# Patient Record
Sex: Female | Born: 1960 | Race: White | Hispanic: No | Marital: Single | Smoking: Current every day smoker
Health system: Southern US, Community
[De-identification: ages and names within clinical notes are randomized; demographics above are authoritative.]

## PROBLEM LIST (undated history)

## (undated) DIAGNOSIS — I2510 Atherosclerotic heart disease of native coronary artery without angina pectoris: Secondary | ICD-10-CM

## (undated) DIAGNOSIS — M79646 Pain in unspecified finger(s): Secondary | ICD-10-CM

## (undated) DIAGNOSIS — R0600 Dyspnea, unspecified: Secondary | ICD-10-CM

## (undated) DIAGNOSIS — I10 Essential (primary) hypertension: Secondary | ICD-10-CM

## (undated) DIAGNOSIS — E7800 Pure hypercholesterolemia, unspecified: Secondary | ICD-10-CM

## (undated) DIAGNOSIS — I219 Acute myocardial infarction, unspecified: Secondary | ICD-10-CM

## (undated) DIAGNOSIS — F32A Depression, unspecified: Secondary | ICD-10-CM

## (undated) DIAGNOSIS — F419 Anxiety disorder, unspecified: Secondary | ICD-10-CM

## (undated) DIAGNOSIS — Z8489 Family history of other specified conditions: Secondary | ICD-10-CM

## (undated) DIAGNOSIS — Z8669 Personal history of other diseases of the nervous system and sense organs: Secondary | ICD-10-CM

## (undated) DIAGNOSIS — J189 Pneumonia, unspecified organism: Secondary | ICD-10-CM

## (undated) DIAGNOSIS — J449 Chronic obstructive pulmonary disease, unspecified: Secondary | ICD-10-CM

## (undated) DIAGNOSIS — I739 Peripheral vascular disease, unspecified: Secondary | ICD-10-CM

## (undated) DIAGNOSIS — G8929 Other chronic pain: Secondary | ICD-10-CM

## (undated) DIAGNOSIS — M797 Fibromyalgia: Secondary | ICD-10-CM

## (undated) DIAGNOSIS — J439 Emphysema, unspecified: Secondary | ICD-10-CM

## (undated) DIAGNOSIS — Z8679 Personal history of other diseases of the circulatory system: Secondary | ICD-10-CM

## (undated) DIAGNOSIS — I252 Old myocardial infarction: Secondary | ICD-10-CM

## (undated) DIAGNOSIS — M1990 Unspecified osteoarthritis, unspecified site: Secondary | ICD-10-CM

## (undated) DIAGNOSIS — Z8639 Personal history of other endocrine, nutritional and metabolic disease: Secondary | ICD-10-CM

## (undated) DIAGNOSIS — Z87898 Personal history of other specified conditions: Secondary | ICD-10-CM

## (undated) DIAGNOSIS — G8911 Acute pain due to trauma: Secondary | ICD-10-CM

## (undated) DIAGNOSIS — M25511 Pain in right shoulder: Secondary | ICD-10-CM

## (undated) DIAGNOSIS — R911 Solitary pulmonary nodule: Secondary | ICD-10-CM

## (undated) DIAGNOSIS — Z9889 Other specified postprocedural states: Secondary | ICD-10-CM

## (undated) DIAGNOSIS — K5100 Ulcerative (chronic) pancolitis without complications: Secondary | ICD-10-CM

## (undated) DIAGNOSIS — F329 Major depressive disorder, single episode, unspecified: Secondary | ICD-10-CM

## (undated) DIAGNOSIS — R569 Unspecified convulsions: Secondary | ICD-10-CM

## (undated) DIAGNOSIS — I700 Atherosclerosis of aorta: Secondary | ICD-10-CM

## (undated) DIAGNOSIS — J45909 Unspecified asthma, uncomplicated: Secondary | ICD-10-CM

## (undated) DIAGNOSIS — R42 Dizziness and giddiness: Secondary | ICD-10-CM

## (undated) DIAGNOSIS — G43909 Migraine, unspecified, not intractable, without status migrainosus: Secondary | ICD-10-CM

## (undated) DIAGNOSIS — R5382 Chronic fatigue, unspecified: Secondary | ICD-10-CM

## (undated) DIAGNOSIS — S2249XA Multiple fractures of ribs, unspecified side, initial encounter for closed fracture: Secondary | ICD-10-CM

## (undated) HISTORY — DX: Major depressive disorder, single episode, unspecified: F32.9

## (undated) HISTORY — DX: Acute pain due to trauma: G89.11

## (undated) HISTORY — PX: KNEE ARTHROPLASTY: SHX992

## (undated) HISTORY — PX: BACK SURGERY: SHX140

## (undated) HISTORY — DX: Multiple fractures of ribs, unspecified side, initial encounter for closed fracture: S22.49XA

## (undated) HISTORY — DX: Anxiety disorder, unspecified: F41.9

## (undated) HISTORY — DX: Depression, unspecified: F32.A

## (undated) HISTORY — DX: Pain in right shoulder: M25.511

## (undated) HISTORY — DX: Family history of other specified conditions: Z84.89

## (undated) HISTORY — DX: Personal history of other specified conditions: Z87.898

## (undated) HISTORY — DX: Old myocardial infarction: I25.2

## (undated) HISTORY — DX: Other chronic pain: G89.29

## (undated) HISTORY — DX: Atherosclerotic heart disease of native coronary artery without angina pectoris: I25.10

## (undated) HISTORY — DX: Pain in unspecified finger(s): M79.646

## (undated) HISTORY — DX: Chronic obstructive pulmonary disease, unspecified: J44.9

## (undated) HISTORY — PX: ABDOMINAL HYSTERECTOMY: SHX81

## (undated) HISTORY — DX: Personal history of other endocrine, nutritional and metabolic disease: Z86.39

## (undated) HISTORY — DX: Dizziness and giddiness: R42

## (undated) HISTORY — DX: Chronic fatigue, unspecified: R53.82

## (undated) HISTORY — DX: Essential (primary) hypertension: I10

## (undated) HISTORY — PX: TRIGGER FINGER RELEASE: SHX641

## (undated) HISTORY — PX: FRACTURE SURGERY: SHX138

## (undated) HISTORY — DX: Unspecified osteoarthritis, unspecified site: M19.90

## (undated) HISTORY — DX: Pure hypercholesterolemia, unspecified: E78.00

## (undated) HISTORY — PX: OTHER SURGICAL HISTORY: SHX169

## (undated) HISTORY — DX: Personal history of other diseases of the circulatory system: Z86.79

## (undated) HISTORY — PX: LUMBAR DISC SURGERY: SHX700

## (undated) HISTORY — PX: ROTATOR CUFF REPAIR: SHX139

## (undated) HISTORY — DX: Other specified postprocedural states: Z98.890

## (undated) HISTORY — DX: Unspecified asthma, uncomplicated: J45.909

## (undated) HISTORY — DX: Peripheral vascular disease, unspecified: I73.9

## (undated) HISTORY — DX: Personal history of other diseases of the nervous system and sense organs: Z86.69

---

## 2011-08-11 ENCOUNTER — Emergency Department: Payer: Self-pay | Admitting: Emergency Medicine

## 2011-08-12 ENCOUNTER — Ambulatory Visit: Payer: Self-pay | Admitting: Anesthesiology

## 2011-08-16 ENCOUNTER — Ambulatory Visit: Payer: Self-pay | Admitting: Anesthesiology

## 2011-08-22 ENCOUNTER — Ambulatory Visit: Payer: Self-pay | Admitting: Anesthesiology

## 2011-08-24 ENCOUNTER — Observation Stay: Payer: Self-pay | Admitting: Internal Medicine

## 2011-08-28 ENCOUNTER — Ambulatory Visit: Payer: Self-pay | Admitting: Anesthesiology

## 2011-10-30 ENCOUNTER — Emergency Department: Payer: Self-pay | Admitting: Emergency Medicine

## 2011-12-03 ENCOUNTER — Emergency Department: Payer: Self-pay | Admitting: Emergency Medicine

## 2012-02-11 ENCOUNTER — Emergency Department: Payer: Self-pay | Admitting: Emergency Medicine

## 2012-02-14 ENCOUNTER — Emergency Department: Payer: Self-pay | Admitting: Emergency Medicine

## 2012-03-17 ENCOUNTER — Ambulatory Visit: Payer: Self-pay | Admitting: Family Medicine

## 2012-03-28 ENCOUNTER — Emergency Department: Payer: Self-pay | Admitting: Emergency Medicine

## 2012-04-16 ENCOUNTER — Other Ambulatory Visit: Payer: Self-pay | Admitting: Internal Medicine

## 2012-04-16 LAB — CBC WITH DIFFERENTIAL/PLATELET
Basophil #: 0.1 10*3/uL (ref 0.0–0.1)
Basophil %: 1.2 %
Basophil: 2 %
Comment - H1-Com3: NORMAL
Eosinophil #: 0.2 10*3/uL (ref 0.0–0.7)
Eosinophil %: 1.9 %
Eosinophil: 3 %
HCT: 47.1 % — ABNORMAL HIGH (ref 35.0–47.0)
HGB: 15.8 g/dL (ref 12.0–16.0)
Lymphocyte #: 3.3 10*3/uL (ref 1.0–3.6)
Lymphocyte %: 27 %
Lymphocytes: 25 %
MCH: 33.3 pg (ref 26.0–34.0)
MCHC: 33.5 g/dL (ref 32.0–36.0)
MCV: 99 fL (ref 80–100)
Monocyte #: 0.5 x10 3/mm (ref 0.2–0.9)
Monocyte %: 4.1 %
Monocytes: 5 %
Neutrophil #: 8 10*3/uL — ABNORMAL HIGH (ref 1.4–6.5)
Neutrophil %: 65.8 %
Platelet: 369 10*3/uL (ref 150–440)
RBC: 4.74 10*6/uL (ref 3.80–5.20)
RDW: 15.1 % — ABNORMAL HIGH (ref 11.5–14.5)
Segmented Neutrophils: 63 %
Variant Lymphocyte - H1-Rlymph: 2 %
WBC: 12.2 10*3/uL — ABNORMAL HIGH (ref 3.6–11.0)

## 2012-08-12 ENCOUNTER — Emergency Department: Payer: Self-pay | Admitting: Unknown Physician Specialty

## 2012-08-12 LAB — URINALYSIS, COMPLETE
Bilirubin,UR: NEGATIVE
Glucose,UR: NEGATIVE mg/dL (ref 0–75)
Ketone: NEGATIVE
Leukocyte Esterase: NEGATIVE
Nitrite: NEGATIVE
RBC,UR: 2 /HPF (ref 0–5)
Squamous Epithelial: 52
WBC UR: 2 /HPF (ref 0–5)

## 2012-08-12 LAB — CBC WITH DIFFERENTIAL/PLATELET
Basophil #: 0.1 10*3/uL (ref 0.0–0.1)
Eosinophil %: 0.1 %
HCT: 41.8 % (ref 35.0–47.0)
HGB: 14.4 g/dL (ref 12.0–16.0)
Lymphocyte #: 4 10*3/uL — ABNORMAL HIGH (ref 1.0–3.6)
Lymphocyte %: 36.3 %
MCHC: 34.4 g/dL (ref 32.0–36.0)
Monocyte #: 0.7 x10 3/mm (ref 0.2–0.9)
Neutrophil #: 6.2 10*3/uL (ref 1.4–6.5)
Neutrophil %: 56.6 %
Platelet: 320 10*3/uL (ref 150–440)
RBC: 4.1 10*6/uL (ref 3.80–5.20)
RDW: 15.6 % — ABNORMAL HIGH (ref 11.5–14.5)
WBC: 10.9 10*3/uL (ref 3.6–11.0)

## 2012-08-12 LAB — BASIC METABOLIC PANEL
BUN: 17 mg/dL (ref 7–18)
Calcium, Total: 8.7 mg/dL (ref 8.5–10.1)
EGFR (African American): >60
EGFR (Non-African Amer.): >60
Glucose: 92 mg/dL (ref 65–99)

## 2012-08-13 ENCOUNTER — Emergency Department: Payer: Self-pay | Admitting: Internal Medicine

## 2012-12-02 ENCOUNTER — Emergency Department: Payer: Self-pay | Admitting: Emergency Medicine

## 2012-12-02 LAB — URINALYSIS, COMPLETE
Bilirubin,UR: NEGATIVE
Blood: NEGATIVE
Ketone: NEGATIVE
Nitrite: NEGATIVE
Ph: 5 (ref 4.5–8.0)
RBC,UR: 2 /HPF (ref 0–5)
Squamous Epithelial: 1
WBC UR: <1 /HPF (ref 0–5)

## 2012-12-02 LAB — COMPREHENSIVE METABOLIC PANEL
Alkaline Phosphatase: 92 U/L (ref 50–136)
Anion Gap: 7 (ref 7–16)
Bilirubin,Total: <0.1 mg/dL — ABNORMAL LOW (ref 0.2–1.0)
Calcium, Total: 8.3 mg/dL — ABNORMAL LOW (ref 8.5–10.1)
Chloride: 111 mmol/L — ABNORMAL HIGH (ref 98–107)
Co2: 26 mmol/L (ref 21–32)
Creatinine: 0.91 mg/dL (ref 0.60–1.30)
EGFR (African American): >60
EGFR (Non-African Amer.): >60
Glucose: 91 mg/dL (ref 65–99)
Osmolality: 287 (ref 275–301)
Potassium: 4.2 mmol/L (ref 3.5–5.1)
SGOT(AST): 19 U/L (ref 15–37)
SGPT (ALT): 21 U/L (ref 12–78)

## 2012-12-02 LAB — CBC
HGB: 13.8 g/dL (ref 12.0–16.0)
MCHC: 32.7 g/dL (ref 32.0–36.0)
MCV: 102 fL — ABNORMAL HIGH (ref 80–100)
Platelet: 272 10*3/uL (ref 150–440)
RBC: 4.15 10*6/uL (ref 3.80–5.20)

## 2013-03-27 ENCOUNTER — Emergency Department: Payer: Self-pay | Admitting: Emergency Medicine

## 2013-03-27 LAB — CBC
HCT: 44 % (ref 35.0–47.0)
HGB: 15.1 g/dL (ref 12.0–16.0)
MCH: 35 pg — ABNORMAL HIGH (ref 26.0–34.0)
MCHC: 34.3 g/dL (ref 32.0–36.0)
MCV: 102 fL — ABNORMAL HIGH (ref 80–100)
Platelet: 341 10*3/uL (ref 150–440)
RBC: 4.32 10*6/uL (ref 3.80–5.20)
RDW: 14.3 % (ref 11.5–14.5)

## 2013-03-27 LAB — COMPREHENSIVE METABOLIC PANEL
Alkaline Phosphatase: 123 U/L (ref 50–136)
Anion Gap: 6 — ABNORMAL LOW (ref 7–16)
Bilirubin,Total: 0.1 mg/dL — ABNORMAL LOW (ref 0.2–1.0)
Calcium, Total: 9 mg/dL (ref 8.5–10.1)
Chloride: 107 mmol/L (ref 98–107)
Co2: 26 mmol/L (ref 21–32)
Creatinine: 0.75 mg/dL (ref 0.60–1.30)
EGFR (African American): >60
SGOT(AST): 45 U/L — ABNORMAL HIGH (ref 15–37)
Total Protein: 7.6 g/dL (ref 6.4–8.2)

## 2013-03-27 LAB — URINALYSIS, COMPLETE
Bilirubin,UR: NEGATIVE
Ketone: NEGATIVE
Leukocyte Esterase: NEGATIVE
Nitrite: NEGATIVE
Protein: NEGATIVE
Squamous Epithelial: <1
WBC UR: NONE SEEN /HPF (ref 0–5)

## 2013-03-27 LAB — DRUG SCREEN, URINE
Amphetamines, Ur Screen: NEGATIVE (ref ?–1000)
Cannabinoid 50 Ng, Ur ~~LOC~~: NEGATIVE (ref ?–50)
MDMA (Ecstasy)Ur Screen: NEGATIVE (ref ?–500)
Methadone, Ur Screen: NEGATIVE (ref ?–300)
Opiate, Ur Screen: POSITIVE (ref ?–300)
Phencyclidine (PCP) Ur S: NEGATIVE (ref ?–25)

## 2013-03-28 ENCOUNTER — Emergency Department: Payer: Self-pay | Admitting: Emergency Medicine

## 2013-06-10 ENCOUNTER — Ambulatory Visit: Payer: Self-pay | Admitting: Family Medicine

## 2013-09-28 ENCOUNTER — Ambulatory Visit: Payer: Self-pay | Admitting: Family Medicine

## 2013-10-26 ENCOUNTER — Emergency Department: Payer: Self-pay | Admitting: Emergency Medicine

## 2013-10-26 LAB — COMPREHENSIVE METABOLIC PANEL
Anion Gap: 6 — ABNORMAL LOW (ref 7–16)
BUN: 24 mg/dL — ABNORMAL HIGH (ref 7–18)
Chloride: 106 mmol/L (ref 98–107)
Co2: 27 mmol/L (ref 21–32)
EGFR (African American): >60
EGFR (Non-African Amer.): >60
Glucose: 114 mg/dL — ABNORMAL HIGH (ref 65–99)
Potassium: 4.2 mmol/L (ref 3.5–5.1)
SGOT(AST): 18 U/L (ref 15–37)
SGPT (ALT): 23 U/L (ref 12–78)
Total Protein: 6.9 g/dL (ref 6.4–8.2)

## 2013-10-26 LAB — URINALYSIS, COMPLETE
Bilirubin,UR: NEGATIVE
Blood: NEGATIVE
Glucose,UR: NEGATIVE mg/dL (ref 0–75)
Ketone: NEGATIVE
RBC,UR: 6 /HPF (ref 0–5)
Specific Gravity: 1.018 (ref 1.003–1.030)
Squamous Epithelial: 17
WBC UR: 4 /HPF (ref 0–5)

## 2013-10-26 LAB — LIPASE, BLOOD: Lipase: 188 U/L (ref 73–393)

## 2013-10-26 LAB — CBC WITH DIFFERENTIAL/PLATELET
Eosinophil #: 0 10*3/uL (ref 0.0–0.7)
Eosinophil %: 0.1 %
HGB: 14.4 g/dL (ref 12.0–16.0)
MCHC: 34 g/dL (ref 32.0–36.0)
MCV: 102 fL — ABNORMAL HIGH (ref 80–100)
Monocyte #: 0.7 x10 3/mm (ref 0.2–0.9)
Monocyte %: 5.5 %
Neutrophil #: 6.9 10*3/uL — ABNORMAL HIGH (ref 1.4–6.5)
RBC: 4.16 10*6/uL (ref 3.80–5.20)
WBC: 12.5 10*3/uL — ABNORMAL HIGH (ref 3.6–11.0)

## 2013-10-27 LAB — CLOSTRIDIUM DIFFICILE(ARMC)

## 2014-04-20 ENCOUNTER — Ambulatory Visit: Payer: Self-pay | Admitting: Ophthalmology

## 2014-04-20 DIAGNOSIS — I10 Essential (primary) hypertension: Secondary | ICD-10-CM

## 2014-04-20 DIAGNOSIS — Z01810 Encounter for preprocedural cardiovascular examination: Secondary | ICD-10-CM

## 2014-05-03 ENCOUNTER — Ambulatory Visit: Payer: Self-pay | Admitting: Ophthalmology

## 2014-05-24 ENCOUNTER — Emergency Department: Payer: Self-pay | Admitting: Emergency Medicine

## 2014-05-25 ENCOUNTER — Ambulatory Visit: Payer: Self-pay | Admitting: Pain Medicine

## 2014-05-26 ENCOUNTER — Other Ambulatory Visit: Payer: Self-pay | Admitting: Pain Medicine

## 2014-05-26 LAB — HEPATIC FUNCTION PANEL A (ARMC)
ALT: 20 U/L (ref 12–78)
Albumin: 3.8 g/dL (ref 3.4–5.0)
Alkaline Phosphatase: 64 U/L
BILIRUBIN TOTAL: 0.2 mg/dL (ref 0.2–1.0)
Bilirubin, Direct: <0.1 mg/dL (ref 0.00–0.20)
SGOT(AST): 21 U/L (ref 15–37)
TOTAL PROTEIN: 6.9 g/dL (ref 6.4–8.2)

## 2014-05-26 LAB — BASIC METABOLIC PANEL
Anion Gap: 5 — ABNORMAL LOW (ref 7–16)
BUN: 14 mg/dL (ref 7–18)
CO2: 27 mmol/L (ref 21–32)
Calcium, Total: 9 mg/dL (ref 8.5–10.1)
Chloride: 106 mmol/L (ref 98–107)
Creatinine: 0.82 mg/dL (ref 0.60–1.30)
EGFR (African American): >60
EGFR (Non-African Amer.): >60
Glucose: 112 mg/dL — ABNORMAL HIGH (ref 65–99)
OSMOLALITY: 277 (ref 275–301)
POTASSIUM: 4.3 mmol/L (ref 3.5–5.1)
Sodium: 138 mmol/L (ref 136–145)

## 2014-05-26 LAB — MAGNESIUM: MAGNESIUM: 1.8 mg/dL

## 2014-05-26 LAB — SEDIMENTATION RATE: ERYTHROCYTE SED RATE: 4 mm/h (ref 0–30)

## 2014-05-31 ENCOUNTER — Ambulatory Visit: Payer: Self-pay | Admitting: Pain Medicine

## 2014-06-01 ENCOUNTER — Ambulatory Visit: Payer: Self-pay | Admitting: Ophthalmology

## 2014-06-20 ENCOUNTER — Ambulatory Visit: Payer: Self-pay | Admitting: Pain Medicine

## 2014-06-21 ENCOUNTER — Ambulatory Visit: Payer: Self-pay | Admitting: Pain Medicine

## 2014-07-07 ENCOUNTER — Ambulatory Visit: Payer: Self-pay | Admitting: Pain Medicine

## 2014-07-20 ENCOUNTER — Emergency Department: Payer: Self-pay | Admitting: Internal Medicine

## 2014-07-29 DIAGNOSIS — IMO0002 Reserved for concepts with insufficient information to code with codable children: Secondary | ICD-10-CM | POA: Insufficient documentation

## 2014-07-29 DIAGNOSIS — N8110 Cystocele, unspecified: Secondary | ICD-10-CM | POA: Insufficient documentation

## 2014-08-01 ENCOUNTER — Ambulatory Visit: Payer: Self-pay | Admitting: Pain Medicine

## 2014-08-02 ENCOUNTER — Ambulatory Visit: Payer: Self-pay | Admitting: Pain Medicine

## 2014-08-22 ENCOUNTER — Ambulatory Visit: Payer: Self-pay | Admitting: Pain Medicine

## 2014-09-06 ENCOUNTER — Ambulatory Visit: Payer: Self-pay | Admitting: Pain Medicine

## 2014-09-14 ENCOUNTER — Ambulatory Visit: Payer: Self-pay | Admitting: Pain Medicine

## 2014-09-15 ENCOUNTER — Ambulatory Visit: Payer: Self-pay | Admitting: Pain Medicine

## 2014-10-06 ENCOUNTER — Emergency Department: Payer: Self-pay | Admitting: Internal Medicine

## 2014-11-15 DIAGNOSIS — M75100 Unspecified rotator cuff tear or rupture of unspecified shoulder, not specified as traumatic: Secondary | ICD-10-CM | POA: Insufficient documentation

## 2014-11-18 ENCOUNTER — Ambulatory Visit: Payer: Self-pay | Admitting: Pain Medicine

## 2014-12-14 DIAGNOSIS — J449 Chronic obstructive pulmonary disease, unspecified: Secondary | ICD-10-CM | POA: Insufficient documentation

## 2014-12-14 DIAGNOSIS — I252 Old myocardial infarction: Secondary | ICD-10-CM | POA: Insufficient documentation

## 2014-12-14 HISTORY — DX: Old myocardial infarction: I25.2

## 2015-02-18 ENCOUNTER — Emergency Department: Payer: Self-pay | Admitting: Physician Assistant

## 2015-03-25 NOTE — Op Note (Signed)
PATIENT NAME:  Michelle Newton, Michelle M MR#:  [id number] DATE OF BIRTH:  [birth year]  DATE OF PROCEDURE:  05/03/2014  PREOPERATIVE DIAGNOSIS: Visually significant cataract of the right eye.   POSTOPERATIVE DIAGNOSIS: Visually significant cataract of the right eye.   OPERATIVE PROCEDURE: Cataract extraction by phacoemulsification with implant of intraocular lens to the right eye.   SURGEON: Galen ManilaWilliam Trampas Stettner, MD  ANESTHESIA:  1. Managed anesthesia care.  2. 50-50 mixture of 0.75% bupivacaine and 4% Xylocaine given as a retrobulbar block.   COMPLICATIONS: None.   TECHNIQUE:  Stop and chop.  DESCRIPTION OF PROCEDURE: The patient was examined and consented for this procedure in the preoperative holding area and then brought back to the Operating Room where the anesthesia team employed managed anesthesia care.  3.5 milliliters of the aforementioned mixture were placed in the right orbit on an Atkinson needle without complication. The right eye was then prepped and draped in the usual sterile ophthalmic fashion. A lid speculum was placed. The side-port blade was used to create a paracentesis and the anterior chamber was filled with viscoelastic. The keratome was used to create a near clear corneal incision. The continuous curvilinear capsulorrhexis was performed with a cystotome followed by the capsulorrhexis forceps. Hydrodissection and hydrodelineation were carried out with BSS on a blunt cannula. The lens was removed in a stop and chop technique. The remaining cortical material was removed with the irrigation-aspiration handpiece. The capsular bag was inflated with viscoelastic and the Tecnis ZCB00 21.5-diopter lens, serial number [id number] was placed in the capsular bag without complication. The remaining viscoelastic was removed from the eye with the irrigation-aspiration handpiece. The wounds were hydrated. The anterior chamber was flushed with Miostat and the eye was inflated to a physiologic pressure. 0.1  mL of cefuroxime concentration 10 mg/mL was placed in the anterior chamber. The wounds were found to be water tight. The eye was dressed with Vigamox followed by Maxitrol ointment and a protective shield was placed. The patient will followup with me in one day.    ____________________________ Jerilee FieldWilliam L. Lem Peary, MD wlp:dmm D: 05/03/2014 21:25:05 ET T: 05/03/2014 21:37:20 ET JOB#: [id number]  cc: Aryiana Klinkner L. Lashanda Storlie, MD, <Dictator> Jerilee FieldWILLIAM L Esty Ahuja MD ELECTRONICALLY SIGNED 05/04/2014 13:44

## 2015-04-17 ENCOUNTER — Emergency Department
Admission: EM | Admit: 2015-04-17 | Discharge: 2015-04-17 | Disposition: A | Discharge status: Other Acute Inpatient Hospital | Payer: Medicare Other
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
## 2015-04-18 ENCOUNTER — Encounter: Payer: Self-pay | Admitting: Emergency Medicine

## 2015-04-18 ENCOUNTER — Emergency Department: Payer: Medicare Other

## 2015-04-18 ENCOUNTER — Emergency Department
Admission: EM | Admit: 2015-04-18 | Discharge: 2015-04-18 | Disposition: A | Discharge status: Home / Self Care | Payer: Medicare Other | Attending: Internal Medicine | Admitting: Internal Medicine

## 2015-04-18 DIAGNOSIS — Z720 Tobacco use: Secondary | ICD-10-CM | POA: Diagnosis not present

## 2015-04-18 DIAGNOSIS — Y9389 Activity, other specified: Secondary | ICD-10-CM | POA: Diagnosis not present

## 2015-04-18 DIAGNOSIS — Y998 Other external cause status: Secondary | ICD-10-CM | POA: Diagnosis not present

## 2015-04-18 DIAGNOSIS — S59902A Unspecified injury of left elbow, initial encounter: Secondary | ICD-10-CM | POA: Insufficient documentation

## 2015-04-18 DIAGNOSIS — S5002XA Contusion of left elbow, initial encounter: Secondary | ICD-10-CM | POA: Diagnosis not present

## 2015-04-18 DIAGNOSIS — Y92481 Parking lot as the place of occurrence of the external cause: Secondary | ICD-10-CM | POA: Diagnosis not present

## 2015-04-18 DIAGNOSIS — S299XXA Unspecified injury of thorax, initial encounter: Secondary | ICD-10-CM | POA: Diagnosis present

## 2015-04-18 DIAGNOSIS — S3992XA Unspecified injury of lower back, initial encounter: Secondary | ICD-10-CM | POA: Insufficient documentation

## 2015-04-18 DIAGNOSIS — S2232XA Fracture of one rib, left side, initial encounter for closed fracture: Secondary | ICD-10-CM | POA: Diagnosis not present

## 2015-04-18 DIAGNOSIS — T148XXA Other injury of unspecified body region, initial encounter: Secondary | ICD-10-CM

## 2015-04-18 DIAGNOSIS — S61412D Laceration without foreign body of left hand, subsequent encounter: Secondary | ICD-10-CM | POA: Insufficient documentation

## 2015-04-18 HISTORY — DX: Fibromyalgia: M79.7

## 2015-04-18 HISTORY — DX: Migraine, unspecified, not intractable, without status migrainosus: G43.909

## 2015-04-18 HISTORY — DX: Acute myocardial infarction, unspecified: I21.9

## 2015-04-18 HISTORY — DX: Unspecified osteoarthritis, unspecified site: M19.90

## 2015-04-18 HISTORY — DX: Unspecified convulsions: R56.9

## 2015-04-18 HISTORY — DX: Atherosclerotic heart disease of native coronary artery without angina pectoris: I25.10
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
## 2015-04-18 NOTE — Discharge Instructions (Signed)
Take home pain medication as prescribed. Rest. Apply ice. Wear less cleaning as long as pain continues. Performed range of motion exercises to left arm daily. Use incentive spirometer throughout day as directed.  Follow-up with her primary care physician next week. Follow up with orthopedic as needed for continued pain.  Turn to the ER for new or worsening concerns.  Contusion A contusion is a deep bruise. Contusions happen when an injury causes bleeding under the skin. Signs of bruising include pain, puffiness (swelling), and discolored skin. The contusion may turn blue, purple, or yellow. HOME CARE   Put ice on the injured area.  Put ice in a plastic bag.  Place a towel between your skin and the bag.  Leave the ice on for 15-20 minutes, 03-04 times a day.  Only take medicine as told by your doctor.  Rest the injured area.  If possible, raise (elevate) the injured area to lessen puffiness. GET HELP RIGHT AWAY IF:   You have more bruising or puffiness.  You have pain that is getting worse.  Your puffiness or pain is not helped by medicine. MAKE SURE YOU:   Understand these instructions.  Will watch your condition.  Will get help right away if you are not doing well or get worse. Document Released: 05/06/2008 Document Revised: 02/10/2012 Document Reviewed: 09/23/2011 [facility] Patient Information 2015 [place], [place]. This information is not intended to replace advice given to you by your health care provider. Make sure you discuss any questions you have with your health care provider.  Rib Fracture A rib fracture is a break or crack in one of the bones of the ribs. The ribs are like a cage that goes around your upper chest. A broken or cracked rib is often painful, but most do not cause other problems. Most rib fractures heal on their own in 1-3 months. HOME CARE  Avoid activities that cause pain to the injured area. Protect your injured area.  Slowly increase activity as  told by your doctor.  Take medicine as told by your doctor.  Put ice on the injured area for the first 1-2 days after you have been treated or as told by your doctor.  Put ice in a plastic bag.  Place a towel between your skin and the bag.  Leave the ice on for 15-20 minutes at a time, every 2 hours while you are awake.  Do deep breathing as told by your doctor. You may be told to:  Take deep breaths many times a day.  Cough many times a day while hugging a pillow.  Use a device (incentive spirometer) to perform deep breathing many times a day.  Drink enough fluids to keep your pee (urine) clear or pale yellow.   Do not wear a rib belt or binder. These do not allow you to breathe deeply. GET HELP RIGHT AWAY IF:   You have a fever.  You have trouble breathing.   You cannot stop coughing.  You cough up thick or bloody spit (mucus).   You feel sick to your stomach (nauseous), throw up (vomit), or have belly (abdominal) pain.   Your pain gets worse and medicine does not help.  MAKE SURE YOU:   Understand these instructions.  Will watch your condition.  Will get help right away if you are not doing well or get worse. Document Released: 08/27/2008 Document Revised: 03/15/2013 Document Reviewed: 01/20/2013 [facility] CenterExitCare Patient Information 2015 [place], [place]. This information is not intended to replace advice  given to you by your health care provider. Make sure you discuss any questions you have with your health care provider.
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
## 2015-05-17 ENCOUNTER — Emergency Department
Admission: EM | Admit: 2015-05-17 | Discharge: 2015-05-17 | Disposition: A | Discharge status: Home / Self Care | Payer: Medicare Other | Attending: Emergency Medicine | Admitting: Emergency Medicine

## 2015-05-17 ENCOUNTER — Encounter: Payer: Self-pay | Admitting: Emergency Medicine

## 2015-05-17 ENCOUNTER — Emergency Department: Payer: Medicare Other

## 2015-05-17 DIAGNOSIS — W11XXXA Fall on and from ladder, initial encounter: Secondary | ICD-10-CM | POA: Insufficient documentation

## 2015-05-17 DIAGNOSIS — S93401A Sprain of unspecified ligament of right ankle, initial encounter: Secondary | ICD-10-CM | POA: Insufficient documentation

## 2015-05-17 DIAGNOSIS — Y998 Other external cause status: Secondary | ICD-10-CM | POA: Insufficient documentation

## 2015-05-17 DIAGNOSIS — Z720 Tobacco use: Secondary | ICD-10-CM | POA: Diagnosis not present

## 2015-05-17 DIAGNOSIS — Y93E5 Activity, floor mopping and cleaning: Secondary | ICD-10-CM | POA: Insufficient documentation

## 2015-05-17 DIAGNOSIS — Y9289 Other specified places as the place of occurrence of the external cause: Secondary | ICD-10-CM | POA: Diagnosis not present

## 2015-05-17 DIAGNOSIS — S99911A Unspecified injury of right ankle, initial encounter: Secondary | ICD-10-CM | POA: Diagnosis present

## 2015-05-17 NOTE — ED Provider Notes (Signed)
[facility] Emergency Department Provider Note    ____________________________________________  Time seen: 1630  I have reviewed the triage vital signs and the nursing notes.   HISTORY  Chief Complaint Fall   History limited by: Not Limited   HPI Michelle Newton is a 54 y.o. female who presents to the emergency department today after a fall. The patient states she was roughly 6 or 7 feet up a ladder cleaning windows when she fell. States she fell straight onto her back. She denies hitting her head or any loss of consciousness. The patient states she also twisted her right ankle however was not able to tell how she did that. The patient currently is complaining of right ankle and low back pain. Patient denies any neck pain.     Past Medical History  Diagnosis Date  . Fibromyalgia   . Coronary artery disease   . DJD (degenerative joint disease)   . MI (myocardial infarction)   . Seizures   . Migraine     There are no active problems to display for this patient.   Past Surgical History  Procedure Laterality Date  . Ureter mesh    . Lumbar disc surgery    . Rotator cuff repair Right   . Knee arthroplasty Left   . Abdominal hysterectomy    . Trigger finger release Left   . Left elbow surgery      No current outpatient prescriptions on file.  Allergies Cymbalta; Flagyl; and Nsaids  No family history on file.  Social History History  Substance Use Topics  . Smoking status: Current Every Day Smoker -- 0.50 packs/day    Types: Cigarettes  . Smokeless tobacco: Never Used  . Alcohol Use: No    Review of Systems  Constitutional: Negative for fever. Cardiovascular: Negative for chest pain. Respiratory: Negative for shortness of breath. Gastrointestinal: Negative for abdominal pain, vomiting and diarrhea. Genitourinary: Negative for dysuria. Musculoskeletal: Positive for low back pain, right ankle pain Skin: Negative for  rash. Neurological: Negative for headaches, focal weakness or numbness.  10-point ROS otherwise negative.  ____________________________________________   PHYSICAL EXAM:  VITAL SIGNS: ED Triage Vitals  Enc Vitals Group     BP 05/17/15 1328 105/53 mmHg     Pulse Rate 05/17/15 1328 77     Resp 05/17/15 1328 18     Temp 05/17/15 1328 98 F (36.7 C)     Temp Source 05/17/15 1328 Oral     SpO2 05/17/15 1328 96 %     Weight 05/17/15 1328 123 lb (55.792 kg)     Height 05/17/15 1328 4\' 11"  (1.499 m)     Head Cir --      Peak Flow --      Pain Score 05/17/15 1359 8   Constitutional: Alert and oriented. Well appearing and in no distress. Eyes: Conjunctivae are normal. PERRL. Normal extraocular movements. ENT   Head: Normocephalic and atraumatic.   Nose: No congestion/rhinnorhea.   Mouth/Throat: Mucous membranes are moist.   Neck: No stridor. No midline tenderness. Full range of motion which is painless. Hematological/Lymphatic/Immunilogical: No cervical lymphadenopathy. Cardiovascular: Normal rate, regular rhythm.   Respiratory: Normal respiratory effort without tachypnea nor retractions.  Gastrointestinal: Soft and nontender. No distention.  Genitourinary: Deferred Musculoskeletal: Normal range of motion in all extremities. Under to palpation of the lumbar spine. Tender to palpation of the posterior lateral malleolus of the right ankle. Neurologic:  Normal speech and language. No gross focal neurologic deficits are  appreciated. Speech is normal.  Skin:  Skin is warm, dry and intact. No rash noted. Psychiatric: Mood and affect are normal. Speech and behavior are normal. Patient exhibits appropriate insight and judgment.  ____________________________________________    LABS (pertinent positives/negatives)  None  ____________________________________________   EKG  None  ____________________________________________    RADIOLOGY  Lumbar  spine IMPRESSION: No evidence of fracture or subluxation along the lumbar spine. Status post lumbar spinal fusion at L5-S1.  Right ankle  IMPRESSION: Negative. ____________________________________________   PROCEDURES  Procedure(s) performed: None  Critical Care performed: No  ____________________________________________   INITIAL IMPRESSION / ASSESSMENT AND PLAN / ED COURSE  Pertinent labs & imaging results that were available during my care of the patient were reviewed by me and considered in my medical decision making (see chart for details).  Patient presents to the emergency department after a 67 foot fall off of a ladder. Tender to palpation in the lumbar spine and right ankle. Will obtain x-rays.  ----------------------------------------- 5:51 PM on 05/17/2015 -----------------------------------------  Lumbar spine and ankle x-ray negative. Will discharge home.  ____________________________________________   FINAL CLINICAL IMPRESSION(S) / ED DIAGNOSES  Final diagnoses:  Ankle sprain, right, initial encounter     Phineas Semen, MD 05/17/15 1752

## 2015-05-17 NOTE — Discharge Instructions (Signed)
Please seek medical attention for any high fevers, chest pain, shortness of breath, change in behavior, persistent vomiting, bloody stool or any other new or concerning symptoms. ° °Ankle Sprain °An ankle sprain is an injury to the strong, fibrous tissues (ligaments) that hold the bones of your ankle joint together.  °CAUSES °An ankle sprain is usually caused by a fall or by twisting your ankle. Ankle sprains most commonly occur when you step on the outer edge of your foot, and your ankle turns inward. People who participate in sports are more prone to these types of injuries.  °SYMPTOMS  °· Pain in your ankle. The pain may be present at rest or only when you are trying to stand or walk. °· Swelling. °· Bruising. Bruising may develop immediately or within 1 to 2 days after your injury. °· Difficulty standing or walking, particularly when turning corners or changing directions. °DIAGNOSIS  °Your caregiver will ask you details about your injury and perform a physical exam of your ankle to determine if you have an ankle sprain. During the physical exam, your caregiver will press on and apply pressure to specific areas of your foot and ankle. Your caregiver will try to move your ankle in certain ways. An X-ray exam may be done to be sure a bone was not broken or a ligament did not separate from one of the bones in your ankle (avulsion fracture).  °TREATMENT  °Certain types of braces can help stabilize your ankle. Your caregiver can make a recommendation for this. Your caregiver may recommend the use of medicine for pain. If your sprain is severe, your caregiver may refer you to a surgeon who helps to restore function to parts of your skeletal system (orthopedist) or a physical therapist. °HOME CARE INSTRUCTIONS  °· Apply ice to your injury for 1-2 days or as directed by your caregiver. Applying ice helps to reduce inflammation and pain. °¨ Put ice in a plastic bag. °¨ Place a towel between your skin and the  bag. °¨ Leave the ice on for 15-20 minutes at a time, every 2 hours while you are awake. °· Only take over-the-counter or prescription medicines for pain, discomfort, or fever as directed by your caregiver. °· Elevate your injured ankle above the level of your heart as much as possible for 2-3 days. °· If your caregiver recommends crutches, use them as instructed. Gradually put weight on the affected ankle. Continue to use crutches or a cane until you can walk without feeling pain in your ankle. °· If you have a plaster splint, wear the splint as directed by your caregiver. Do not rest it on anything harder than a pillow for the first 24 hours. Do not put weight on it. Do not get it wet. You may take it off to take a shower or bath. °· You may have been given an elastic bandage to wear around your ankle to provide support. If the elastic bandage is too tight (you have numbness or tingling in your foot or your foot becomes cold and blue), adjust the bandage to make it comfortable. °· If you have an air splint, you may blow more air into it or let air out to make it more comfortable. You may take your splint off at night and before taking a shower or bath. Wiggle your toes in the splint several times per day to decrease swelling. °SEEK MEDICAL CARE IF:  °· You have rapidly increasing bruising or swelling. °· Your toes feel extremely   cold or you lose feeling in your foot. °· Your pain is not relieved with medicine. °SEEK IMMEDIATE MEDICAL CARE IF: °· Your toes are numb or blue. °· You have severe pain that is increasing. °MAKE SURE YOU:  °· Understand these instructions. °· Will watch your condition. °· Will get help right away if you are not doing well or get worse. °Document Released: 11/18/2005 Document Revised: 08/12/2012 Document Reviewed: 11/30/2011 °ExitCare® Patient Information ©2015 Exit[employer], LLC. This information is not intended to replace advice given to you by your health care provider. Make sure you  discuss any questions you have with your health care provider. ° °

## 2015-05-17 NOTE — ED Notes (Addendum)
Pt reports falling off a ladder at least 6 feet off of a ladder. . Pt reports falling backwards. Denies hitting head. Reports pain in right ankle. Pt states upper left side back pain. Pt ambulatory after fall increased pain in ankle with ambulation
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
## 2015-05-19 ENCOUNTER — Other Ambulatory Visit: Payer: Self-pay | Admitting: Family Medicine

## 2015-05-19 DIAGNOSIS — N649 Disorder of breast, unspecified: Secondary | ICD-10-CM

## 2015-05-26 ENCOUNTER — Ambulatory Visit: Payer: Medicare Other

## 2015-05-26 ENCOUNTER — Ambulatory Visit
Admission: RE | Admit: 2015-05-26 | Discharge: 2015-05-26 | Disposition: A | Discharge status: Home / Self Care | Payer: Medicare Other | Source: Ambulatory Visit | Attending: Family Medicine | Admitting: Family Medicine

## 2015-05-26 DIAGNOSIS — N649 Disorder of breast, unspecified: Secondary | ICD-10-CM | POA: Insufficient documentation

## 2015-05-26 DIAGNOSIS — N644 Mastodynia: Secondary | ICD-10-CM | POA: Insufficient documentation

## 2015-09-09 ENCOUNTER — Emergency Department: Payer: Medicare Other

## 2015-09-09 ENCOUNTER — Encounter: Payer: Self-pay | Admitting: *Deleted

## 2015-09-09 ENCOUNTER — Emergency Department
Admission: EM | Admit: 2015-09-09 | Discharge: 2015-09-10 | Disposition: A | Discharge status: Home / Self Care | Payer: Medicare Other | Attending: Emergency Medicine | Admitting: Emergency Medicine

## 2015-09-09 DIAGNOSIS — Y9289 Other specified places as the place of occurrence of the external cause: Secondary | ICD-10-CM | POA: Insufficient documentation

## 2015-09-09 DIAGNOSIS — Y998 Other external cause status: Secondary | ICD-10-CM | POA: Diagnosis not present

## 2015-09-09 DIAGNOSIS — S99911A Unspecified injury of right ankle, initial encounter: Secondary | ICD-10-CM | POA: Diagnosis present

## 2015-09-09 DIAGNOSIS — Y9389 Activity, other specified: Secondary | ICD-10-CM | POA: Insufficient documentation

## 2015-09-09 DIAGNOSIS — S82401A Unspecified fracture of shaft of right fibula, initial encounter for closed fracture: Secondary | ICD-10-CM

## 2015-09-09 DIAGNOSIS — S82831A Other fracture of upper and lower end of right fibula, initial encounter for closed fracture: Secondary | ICD-10-CM | POA: Insufficient documentation

## 2015-09-09 DIAGNOSIS — Z720 Tobacco use: Secondary | ICD-10-CM | POA: Diagnosis not present

## 2015-09-09 DIAGNOSIS — W010XXA Fall on same level from slipping, tripping and stumbling without subsequent striking against object, initial encounter: Secondary | ICD-10-CM | POA: Diagnosis not present

## 2015-09-09 MED ORDER — OXYCODONE HCL 5 MG PO TABS: 2.5000 mg | ORAL_TABLET | Freq: Four times a day (QID) | ORAL | Status: DC | PRN

## 2015-09-09 MED ORDER — OXYCODONE HCL 5 MG PO TABS: 0.5000 mg | ORAL_TABLET | Freq: Four times a day (QID) | ORAL | Status: DC | PRN

## 2015-09-09 MED ORDER — OXYCODONE-ACETAMINOPHEN 5-325 MG PO TABS
1.0000 | ORAL_TABLET | Freq: Once | ORAL | Status: AC
  Administered 2015-09-09: 1 via ORAL
  Filled 2015-09-09: qty 1

## 2015-09-09 NOTE — ED Notes (Signed)
Applied posterior leg with stirrup

## 2015-09-09 NOTE — ED Provider Notes (Signed)
CSN: [id number]     Arrival date & time 09/09/15  2135 History   First MD Initiated Contact with Patient 09/09/15 2159     Chief Complaint  Patient presents with  . Ankle Injury     (Consider location/radiation/quality/duration/timing/severity/associated sxs/prior Treatment) HPI  54 year old female presents to the emergency department for evaluation of right ankle and foot pain. Patient states she tripped over her dog, rolled her right ankle and developed right ankle and foot pain. Pain to the lateral aspect of the foot and ankle. Her pain is moderate. No relief with Tylenol 3. Patient denies any knee or hip pain. She is unable to bear weight on the right lower extremity.  Past Medical History  Diagnosis Date  . Fibromyalgia   . Coronary artery disease   . DJD (degenerative joint disease)   . MI (myocardial infarction) (HCC)   . Seizures (HCC)   . Migraine    Past Surgical History  Procedure Laterality Date  . Ureter mesh    . Lumbar disc surgery    . Rotator cuff repair Right   . Knee arthroplasty Left   . Abdominal hysterectomy    . Trigger finger release Left   . Left elbow surgery     Family History  Problem Relation Age of Onset  . Breast cancer Maternal Aunt   . Breast cancer Cousin    Social History  Substance Use Topics  . Smoking status: Current Every Day Smoker -- 0.50 packs/day    Types: Cigarettes  . Smokeless tobacco: Never Used  . Alcohol Use: No   OB History    Gravida Para Term Preterm AB TAB SAB Ectopic Multiple Living   0 0 0 0 0 0 0     Review of Systems  Constitutional: Negative.   Cardiovascular: Negative for chest pain and leg swelling.  Gastrointestinal: Negative for abdominal pain.  Musculoskeletal: Positive for joint swelling and gait problem. Negative for back pain and neck pain.  Skin: Negative for color change, rash and wound.  Neurological: Negative for dizziness, syncope and weakness.  Psychiatric/Behavioral: Negative for  hallucinations and confusion.  All other systems reviewed and are negative.     Allergies  Cymbalta; Flagyl; Nsaids; and Vicodin  Home Medications   Prior to Admission medications   Medication Sig Start Date End Date Taking? Authorizing Provider  oxyCODONE (ROXICODONE) 5 MG immediate release tablet Take 0.5 tablets (2.5 mg total) by mouth every 6 (six) hours as needed for severe pain or breakthrough pain. 09/09/15 09/08/16  Evon Slack, PA-C   BP 111/76 mmHg  Pulse 92  Temp(Src) 97.6 F (36.4 C) (Oral)  Resp 18  Ht  (1.499 m)  Wt 127 lb (57.607 kg)  BMI 25.64 kg/m2  SpO2 95% Physical Exam  Constitutional: She is oriented to person, place, and time. She appears well-developed and well-nourished. No distress.  HENT:  Head: Normocephalic and atraumatic.  Eyes: EOM are normal. Pupils are equal, round, and reactive to light.  Neck: Normal range of motion. Neck supple.  Cardiovascular: Normal rate, regular rhythm and intact distal pulses.   Pulmonary/Chest: Effort normal. No respiratory distress.  Musculoskeletal:       Right ankle: She exhibits decreased range of motion, swelling and ecchymosis. She exhibits no deformity, no laceration and normal pulse. Tenderness. Lateral malleolus and AITFL tenderness found. Achilles tendon exhibits no pain, no defect and normal Thompson's test results.       Left ankle: She exhibits  normal range of motion.       Right foot: There is tenderness and bony tenderness. There is normal range of motion, no swelling, normal capillary refill, no crepitus and no deformity.  Right knee with normal range of motion. No swelling warmth erythema or effusion.  Neurological: She is alert and oriented to person, place, and time.  Skin: Skin is warm and dry.  Psychiatric: She has a normal mood and affect. Her behavior is normal. Judgment and thought content normal.    ED Course  Procedures (including critical care time) SPLINT APPLICATION Date/Time:  10:53 PM Authorized by: Patience Musca Consent: Verbal consent obtained. Risks and benefits: risks, benefits and alternatives were discussed Consent given by: patient Splint applied by: [facility] orthopedic technician Location details: Right ankle  Splint type: Posterior short leg splint with stirrup  Supplies used: 4 inch Ortho-Glass, Ace wrap, cast padding Post-procedure: The splinted body part was neurovascularly unchanged following the procedure. Patient tolerance: Patient tolerated the procedure well with no immediate complications.    Labs Review Labs Reviewed - No data to display  Imaging Review Dg Ankle Complete Right  09/09/2015   CLINICAL DATA:  Status post fall, with pain and swelling about the right ankle. Initial encounter.  EXAM: RIGHT ANKLE - COMPLETE 3+ VIEW  COMPARISON:  Right ankle radiographs performed 05/17/2015  FINDINGS: There is a mildly displaced fracture through the distal fibula.  The ankle mortise is intact; the interosseous space is grossly unremarkable. No talar tilt or subluxation is seen. A small ossicle is noted at the distal Achilles tendon.  The joint spaces are preserved. Mild soft tissue swelling is noted overlying the fracture site.  IMPRESSION: Mildly displaced fracture through the distal fibula.   Electronically Signed   By: Roanna Raider M.D.   On: 09/09/2015 22:30   Dg Foot Complete Right  09/09/2015   CLINICAL DATA:  Status post fall; tripped over dog bed. Right foot pain. Initial encounter.  EXAM: RIGHT FOOT COMPLETE - 3+ VIEW  COMPARISON:  None.  FINDINGS: There is a mildly displaced fracture through the distal fibula. No additional fractures are seen.  The joint spaces are preserved. There is no evidence of talar subluxation; the subtalar joint is unremarkable in appearance. There is a bipartite medial sesamoid of the first toe. An os peroneum is noted.  No significant soft tissue abnormalities are seen.  IMPRESSION: 1. Mildly displaced  fracture through the distal fibula. 2. Bipartite medial sesamoid of the first toe. 3. Os peroneum noted.   Electronically Signed   By: Roanna Raider M.D.   On: 09/09/2015 22:31   I have personally reviewed and evaluated these images and lab results as part of my medical decision-making.   EKG Interpretation None      MDM   Final diagnoses:  Fibula fracture, right, closed, initial encounter    54 year old female with a right distal fibula fracture. There is mild displacement. No evidence of foot fracture. Patient is sent to a posterior leg, stirrup splint. She is nonweightbearing. Dan Humphreys was recommended but patient refused and requested crutches. Patient is given prescription for oxycodone, 5 mg, 2.5 mg by mouth every 6 hours when necessary severe pain. Follow-up with orthopedics first of next week.    Evon Slack, PA-C 09/09/15 2257  Rockne Menghini, MD 09/09/15 [phone]

## 2015-09-09 NOTE — ED Notes (Signed)
Pt presents w/ c/o R ankle and R foot pain after rolling ankle. Pt states she tripped over her dog.

## 2015-09-09 NOTE — ED Notes (Signed)
Pt not tolerating crutches. Michelle Newton will be tried.

## 2015-09-09 NOTE — Discharge Instructions (Signed)
Cast or Splint Care °Casts and splints support injured limbs and keep bones from moving while they heal. It is important to care for your cast or splint at home.   °HOME CARE INSTRUCTIONS °· Keep the cast or splint uncovered during the drying period. It can take 24 to 48 hours to dry if it is made of plaster. A fiberglass cast will dry in less than 1 hour. °· Do not rest the cast on anything harder than a pillow for the first 24 hours. °· Do not put weight on your injured limb or apply pressure to the cast until your health care provider gives you permission. °· Keep the cast or splint dry. Wet casts or splints can lose their shape and may not support the limb as well. A wet cast that has lost its shape can also create harmful pressure on your skin when it dries. Also, wet skin can become infected. °· Cover the cast or splint with a plastic bag when bathing or when out in the rain or snow. If the cast is on the trunk of the body, take sponge baths until the cast is removed. °· If your cast does become wet, dry it with a towel or a blow dryer on the cool setting only. °· Keep your cast or splint clean. Soiled casts may be wiped with a moistened cloth. °· Do not place any hard or soft foreign objects under your cast or splint, such as cotton, toilet paper, lotion, or powder. °· Do not try to scratch the skin under the cast with any object. The object could get stuck inside the cast. Also, scratching could lead to an infection. If itching is a problem, use a blow dryer on a cool setting to relieve discomfort. °· Do not trim or cut your cast or remove padding from inside of it. °· Exercise all joints next to the injury that are not immobilized by the cast or splint. For example, if you have a long leg cast, exercise the hip joint and toes. If you have an arm cast or splint, exercise the shoulder, elbow, thumb, and fingers. °· Elevate your injured arm or leg on 1 or 2 pillows for the first 1 to 3 days to decrease  swelling and pain. It is best if you can comfortably elevate your cast so it is higher than your heart. °SEEK MEDICAL CARE IF:  °· Your cast or splint cracks. °· Your cast or splint is too tight or too loose. °· You have unbearable itching inside the cast. °· Your cast becomes wet or develops a soft spot or area. °· You have a bad smell coming from inside your cast. °· You get an object stuck under your cast. °· Your skin around the cast becomes red or raw. °· You have new pain or worsening pain after the cast has been applied. °SEEK IMMEDIATE MEDICAL CARE IF:  °· You have fluid leaking through the cast. °· You are unable to move your fingers or toes. °· You have discolored (blue or white), cool, painful, or very swollen fingers or toes beyond the cast. °· You have tingling or numbness around the injured area. °· You have severe pain or pressure under the cast. °· You have any difficulty with your breathing or have shortness of breath. °· You have chest pain. °  °This information is not intended to replace advice given to you by your health care provider. Make sure you discuss any questions you have with your health care   provider. °  °Document Released: 11/15/2000 Document Revised: 09/08/2013 Document Reviewed: 05/27/2013 °Elsevier Interactive Patient Education ©2016 [employer] ° °Fibular Ankle Fracture Treated With or Without Immobilization, Adult °A fibular fracture at your ankle is a break (fracture) bone in the smallest of the two bones in your lower leg, located on the outside of your leg (fibula) close to the area at your ankle joint. °CAUSES °· Rolling your ankle. °· Twisting your ankle. °· Extreme flexing or extending of your foot. °· Severe force on your ankle as when falling from a distance. °RISK FACTORS °· Jumping activities. °· Participation in sports. °· Osteoporosis. °· Advanced age. °· Previous ankle injuries. °SIGNS AND SYMPTOMS °· Pain. °· Swelling. °· Inability to put weight on injured  ankle. °· Bruising. °· Bone deformities at site of injury. °DIAGNOSIS  °This fracture is diagnosed with the help of an X-ray exam. °TREATMENT  °If the fractured bone did not move out of place it usually will heal without problems and does casting or splinting. If immobilization is needed for comfort or the fractured bone moved out of place and will not heal properly with immobilization, a cast or splint will be used. °HOME CARE INSTRUCTIONS  °· Apply ice to the area of injury: °¨ Put ice in a plastic bag. °¨ Place a towel between your skin and the bag. °¨ Leave the ice on for 20 minutes, 2-3 times a day. °· Use crutches as directed. Resume walking without crutches as directed by your health care provider. °· Only take over-the-counter or prescription medicines for pain, discomfort, or fever as directed by your health care provider. °· If you have a removable splint or boot, do not remove the boot unless directed by your health care provider. °SEEK MEDICAL CARE IF:  °· You have continued pain or more swelling °· The medications do not control the pain. °SEEK IMMEDIATE MEDICAL CARE IF: °· You develop severe pain in the leg or foot. °· Your skin or nails below the injury turn blue or grey or feel cold or numb. °MAKE SURE YOU:  °· Understand these instructions. °· Will watch your condition. °· Will get help right away if you are not doing well or get worse. °  °This information is not intended to replace advice given to you by your health care provider. Make sure you discuss any questions you have with your health care provider. °  °Document Released: 11/18/2005 Document Revised: 12/09/2014 Document Reviewed: 06/30/2013 °Elsevier Interactive Patient Education ©2016 [employer] ° °

## 2015-09-10 NOTE — ED Notes (Signed)
Pt able to walk with walker and is sent home with both crutches and walker. Pt in no acute distress and verbalizes understanding of walker teaching as well as crutches uses.

## 2015-09-11 ENCOUNTER — Emergency Department: Payer: Medicare Other

## 2015-09-11 ENCOUNTER — Telehealth: Payer: Self-pay | Admitting: Pain Medicine

## 2015-09-11 ENCOUNTER — Encounter: Payer: Self-pay | Admitting: Emergency Medicine

## 2015-09-11 ENCOUNTER — Encounter: Payer: Medicare Other | Admitting: Pain Medicine

## 2015-09-11 ENCOUNTER — Emergency Department
Admission: EM | Admit: 2015-09-11 | Discharge: 2015-09-11 | Disposition: A | Discharge status: Home / Self Care | Payer: Medicare Other | Attending: Emergency Medicine | Admitting: Emergency Medicine

## 2015-09-11 DIAGNOSIS — Y9289 Other specified places as the place of occurrence of the external cause: Secondary | ICD-10-CM | POA: Insufficient documentation

## 2015-09-11 DIAGNOSIS — R339 Retention of urine, unspecified: Secondary | ICD-10-CM | POA: Insufficient documentation

## 2015-09-11 DIAGNOSIS — W1839XA Other fall on same level, initial encounter: Secondary | ICD-10-CM | POA: Insufficient documentation

## 2015-09-11 DIAGNOSIS — Z720 Tobacco use: Secondary | ICD-10-CM | POA: Diagnosis not present

## 2015-09-11 DIAGNOSIS — K5900 Constipation, unspecified: Secondary | ICD-10-CM | POA: Diagnosis not present

## 2015-09-11 DIAGNOSIS — S7001XA Contusion of right hip, initial encounter: Secondary | ICD-10-CM | POA: Diagnosis not present

## 2015-09-11 DIAGNOSIS — S99911A Unspecified injury of right ankle, initial encounter: Secondary | ICD-10-CM | POA: Diagnosis not present

## 2015-09-11 DIAGNOSIS — M25571 Pain in right ankle and joints of right foot: Secondary | ICD-10-CM

## 2015-09-11 DIAGNOSIS — S79911A Unspecified injury of right hip, initial encounter: Secondary | ICD-10-CM | POA: Diagnosis present

## 2015-09-11 DIAGNOSIS — Y998 Other external cause status: Secondary | ICD-10-CM | POA: Insufficient documentation

## 2015-09-11 DIAGNOSIS — Y9389 Activity, other specified: Secondary | ICD-10-CM | POA: Diagnosis not present

## 2015-09-11 DIAGNOSIS — W19XXXA Unspecified fall, initial encounter: Secondary | ICD-10-CM

## 2015-09-11 LAB — CBC WITH DIFFERENTIAL/PLATELET
Basophils Absolute: 0.1 10*3/uL (ref 0–0.1)
Basophils Relative: 1 %
EOS ABS: 0.1 10*3/uL (ref 0–0.7)
Eosinophils Relative: 1 %
HCT: 40.8 % (ref 35.0–47.0)
Hemoglobin: 14.1 g/dL (ref 12.0–16.0)
LYMPHS ABS: 3 10*3/uL (ref 1.0–3.6)
LYMPHS PCT: 26 %
MCH: 34.8 pg — AB (ref 26.0–34.0)
MCHC: 34.5 g/dL (ref 32.0–36.0)
MCV: 100.9 fL — AB (ref 80.0–100.0)
Monocytes Absolute: 0.6 10*3/uL (ref 0.2–0.9)
Monocytes Relative: 5 %
NEUTROS PCT: 67 %
Neutro Abs: 7.8 10*3/uL — ABNORMAL HIGH (ref 1.4–6.5)
Platelets: 289 10*3/uL (ref 150–440)
RBC: 4.04 MIL/uL (ref 3.80–5.20)
RDW: 14 % (ref 11.5–14.5)
WBC: 11.6 10*3/uL — AB (ref 3.6–11.0)

## 2015-09-11 LAB — BASIC METABOLIC PANEL
Anion gap: 6 (ref 5–15)
BUN: 16 mg/dL (ref 6–20)
CO2: 25 mmol/L (ref 22–32)
Calcium: 8.9 mg/dL (ref 8.9–10.3)
Chloride: 109 mmol/L (ref 101–111)
Creatinine, Ser: 0.83 mg/dL (ref 0.44–1.00)
GFR calc Af Amer: >60 mL/min (ref >60)
GFR calc non Af Amer: >60 mL/min (ref >60)
Glucose, Bld: 92 mg/dL (ref 65–99)
POTASSIUM: 3.9 mmol/L (ref 3.5–5.1)
SODIUM: 140 mmol/L (ref 135–145)

## 2015-09-11 MED ORDER — HYDROMORPHONE HCL 1 MG/ML IJ SOLN
1.0000 mg | Freq: Once | INTRAMUSCULAR | Status: AC
  Administered 2015-09-11: 1 mg via INTRAVENOUS
  Filled 2015-09-11: qty 1

## 2015-09-11 MED ORDER — ONDANSETRON HCL 4 MG/2ML IJ SOLN
4.0000 mg | Freq: Once | INTRAMUSCULAR | Status: AC
  Administered 2015-09-11: 4 mg via INTRAVENOUS
  Filled 2015-09-11: qty 2

## 2015-09-11 MED ORDER — LACTULOSE 10 GM/15ML PO SOLN: 20.0000 g | Freq: Every day | ORAL | Status: DC | PRN

## 2015-09-11 MED ORDER — SODIUM CHLORIDE 0.9 % IV BOLUS (SEPSIS)
500.0000 mL | Freq: Once | INTRAVENOUS | Status: AC
  Administered 2015-09-11: 500 mL via INTRAVENOUS

## 2015-09-11 MED ORDER — OXYCODONE HCL 5 MG PO TABS
5.0000 mg | ORAL_TABLET | Freq: Once | ORAL | Status: AC
  Administered 2015-09-11: 5 mg via ORAL
  Filled 2015-09-11: qty 1

## 2015-09-11 NOTE — ED Notes (Signed)
Pt was seen here last evening after a fall and was diagnosed with fractured ankle; right ankle splinted and pt sent home with walker; just pta pt fell using her walking to get to the bathroom; now with increased foot pain and right hip pain; says she's unable to rotate her right foot inward-external rotation noted in wheelchair

## 2015-09-11 NOTE — Discharge Instructions (Signed)
1. Take laxative as needed for bowel movements especially while you are having to take opiate pain medications (lactulose). 2. Use your walker to ambulate at all times and please ask your husband for assistance while using the restroom. 3. Return to the ER for worsening symptoms, persistent vomiting, difficulty breathing or other concerns.  Musculoskeletal Pain Musculoskeletal pain is muscle and boney aches and pains. These pains can occur in any part of the body. Your caregiver may treat you without knowing the cause of the pain. They may treat you if blood or urine tests, X-rays, and other tests were normal.  CAUSES There is often not a definite cause or reason for these pains. These pains may be caused by a type of germ (virus). The discomfort may also come from overuse. Overuse includes working out too hard when your body is not fit. Boney aches also come from weather changes. Bone is sensitive to atmospheric pressure changes. HOME CARE INSTRUCTIONS   Ask when your test results will be ready. Make sure you get your test results.  Only take over-the-counter or prescription medicines for pain, discomfort, or fever as directed by your caregiver. If you were given medications for your condition, do not drive, operate machinery or power tools, or sign legal documents for 24 hours. Do not drink alcohol. Do not take sleeping pills or other medications that may interfere with treatment.  Continue all activities unless the activities cause more pain. When the pain lessens, slowly resume normal activities. Gradually increase the intensity and duration of the activities or exercise.  During periods of severe pain, bed rest may be helpful. Lay or sit in any position that is comfortable.  Putting ice on the injured area.  Put ice in a bag.  Place a towel between your skin and the bag.  Leave the ice on for 15 to 20 minutes, 3 to 4 times a day.  Follow up with your caregiver for continued problems  and no reason can be found for the pain. If the pain becomes worse or does not go away, it may be necessary to repeat tests or do additional testing. Your caregiver may need to look further for a possible cause. SEEK IMMEDIATE MEDICAL CARE IF:  You have pain that is getting worse and is not relieved by medications.  You develop chest pain that is associated with shortness or breath, sweating, feeling sick to your stomach (nauseous), or throw up (vomit).  Your pain becomes localized to the abdomen.  You develop any new symptoms that seem different or that concern you. MAKE SURE YOU:   Understand these instructions.  Will watch your condition.  Will get help right away if you are not doing well or get worse.   This information is not intended to replace advice given to you by your health care provider. Make sure you discuss any questions you have with your health care provider.   Document Released: 11/18/2005 Document Revised: 02/10/2012 Document Reviewed: 07/23/2013 Elsevier Interactive Patient Education 2016 [employer]  Ankle Pain Ankle pain is a common symptom. The bones, cartilage, tendons, and muscles of the ankle joint perform a lot of work each day. The ankle joint holds your body weight and allows you to move around. Ankle pain can occur on either side or back of 1 or both ankles. Ankle pain may be sharp and burning or dull and aching. There may be tenderness, stiffness, redness, or warmth around the ankle. The pain occurs more often when a person  walks or puts pressure on the ankle. CAUSES  There are many reasons ankle pain can develop. It is important to work with your caregiver to identify the cause since many conditions can impact the bones, cartilage, muscles, and tendons. Causes for ankle pain include:  Injury, including a break (fracture), sprain, or strain often due to a fall, sports, or a high-impact activity.  Swelling (inflammation) of a tendon  (tendonitis).  Achilles tendon rupture.  Ankle instability after repeated sprains and strains.  Poor foot alignment.  Pressure on a nerve (tarsal tunnel syndrome).  Arthritis in the ankle or the lining of the ankle.  Crystal formation in the ankle (gout or pseudogout). DIAGNOSIS  A diagnosis is based on your medical history, your symptoms, results of your physical exam, and results of diagnostic tests. Diagnostic tests may include X-ray exams or a computerized magnetic scan (magnetic resonance imaging, MRI). TREATMENT  Treatment will depend on the cause of your ankle pain and may include:  Keeping pressure off the ankle and limiting activities.  Using crutches or other walking support (a cane or brace).  Using rest, ice, compression, and elevation.  Participating in physical therapy or home exercises.  Wearing shoe inserts or special shoes.  Losing weight.  Taking medications to reduce pain or swelling or receiving an injection.  Undergoing surgery. HOME CARE INSTRUCTIONS   Only take over-the-counter or prescription medicines for pain, discomfort, or fever as directed by your caregiver.  Put ice on the injured area.  Put ice in a plastic bag.  Place a towel between your skin and the bag.  Leave the ice on for 15-20 minutes at a time, 03-04 times a day.  Keep your leg raised (elevated) when possible to lessen swelling.  Avoid activities that cause ankle pain.  Follow specific exercises as directed by your caregiver.  Record how often you have ankle pain, the location of the pain, and what it feels like. This information may be helpful to you and your caregiver.  Ask your caregiver about returning to work or sports and whether you should drive.  Follow up with your caregiver for further examination, therapy, or testing as directed. SEEK MEDICAL CARE IF:   Pain or swelling continues or worsens beyond 1 week.  You have an oral temperature above 102 F (38.9  C).  You are feeling unwell or have chills.  You are having an increasingly difficult time with walking.  You have loss of sensation or other new symptoms.  You have questions or concerns. MAKE SURE YOU:   Understand these instructions.  Will watch your condition.  Will get help right away if you are not doing well or get worse.   This information is not intended to replace advice given to you by your health care provider. Make sure you discuss any questions you have with your health care provider.   Document Released: 05/08/2010 Document Revised: 02/10/2012 Document Reviewed: 06/20/2015 Elsevier Interactive Patient Education 2016 [employer]  Acute Urinary Retention, Female Urinary retention means you are unable to pee completely or at all (empty your bladder). HOME CARE  Drink enough fluids to keep your pee (urine) clear or pale yellow.  If you are sent home with a tube that drains the bladder (catheter), there will be a drainage bag attached to it. There are two types of bags. One is big that you can wear at night without having to empty it. One is smaller and needs to be emptied more often.  Keep  the drainage bag emptied.  Keep the drainage bag lower than the tube.  Only take medicine as told by your doctor. GET HELP IF:  You have a low-grade fever.  You have spasms or you are leaking pee when you have spasms. GET HELP RIGHT AWAY IF:   You have chills or a fever.  Your catheter stops draining pee.  Your catheter falls out.  You have increased bleeding that does not stop after you have rested and increased the amount of fluids you had been drinking. MAKE SURE YOU:   Understand these instructions.  Will watch your condition.  Will get help right away if you are not doing well or get worse.   This information is not intended to replace advice given to you by your health care provider. Make sure you discuss any questions you have with your health care  provider.   Document Released: 05/06/2008 Document Revised: 04/04/2015 Document Reviewed: 04/29/2013 Elsevier Interactive Patient Education 2016 [employer]  Contusion A contusion is a deep bruise. Contusions are the result of a blunt injury to tissues and muscle fibers under the skin. The injury causes bleeding under the skin. The skin overlying the contusion may turn blue, purple, or yellow. Minor injuries will give you a painless contusion, but more severe contusions may stay painful and swollen for a few weeks.  CAUSES  This condition is usually caused by a blow, trauma, or direct force to an area of the body. SYMPTOMS  Symptoms of this condition include:  Swelling of the injured area.  Pain and tenderness in the injured area.  Discoloration. The area may have redness and then turn blue, purple, or yellow. DIAGNOSIS  This condition is diagnosed based on a physical exam and medical history. An X-ray, CT scan, or MRI may be needed to determine if there are any associated injuries, such as broken bones (fractures). TREATMENT  Specific treatment for this condition depends on what area of the body was injured. In general, the best treatment for a contusion is resting, icing, applying pressure to (compression), and elevating the injured area. This is often called the RICE strategy. Over-the-counter anti-inflammatory medicines may also be recommended for pain control.  HOME CARE INSTRUCTIONS   Rest the injured area.  If directed, apply ice to the injured area:  Put ice in a plastic bag.  Place a towel between your skin and the bag.  Leave the ice on for 20 minutes, 2-3 times per day.  If directed, apply light compression to the injured area using an elastic bandage. Make sure the bandage is not wrapped too tightly. Remove and reapply the bandage as directed by your health care provider.  If possible, raise (elevate) the injured area above the level of your heart while you are  sitting or lying down.  Take over-the-counter and prescription medicines only as told by your health care provider. SEEK MEDICAL CARE IF:  Your symptoms do not improve after several days of treatment.  Your symptoms get worse.  You have difficulty moving the injured area. SEEK IMMEDIATE MEDICAL CARE IF:   You have severe pain.  You have numbness in a hand or foot.  Your hand or foot turns pale or cold.   This information is not intended to replace advice given to you by your health care provider. Make sure you discuss any questions you have with your health care provider.   Document Released: 08/28/2005 Document Revised: 08/09/2015 Document Reviewed: 04/05/2015 Elsevier Interactive Patient Education 2016  [employer]  Constipation, Adult Constipation is when a person:  Poops (has a bowel movement) less than 3 times a week.  Has a hard time pooping.  Has poop that is dry, hard, or bigger than normal. HOME CARE   Eat foods with a lot of fiber in them. This includes fruits, vegetables, beans, and whole grains such as brown rice.  Avoid fatty foods and foods with a lot of sugar. This includes french fries, hamburgers, cookies, candy, and soda.  If you are not getting enough fiber from food, take products with added fiber in them (supplements).  Drink enough fluid to keep your pee (urine) clear or pale yellow.  Exercise on a regular basis, or as told by your doctor.  Go to the restroom when you feel like you need to poop. Do not hold it.  Only take medicine as told by your doctor. Do not take medicines that help you poop (laxatives) without talking to your doctor first. GET HELP RIGHT AWAY IF:   You have bright red blood in your poop (stool).  Your constipation lasts more than 4 days or gets worse.  You have belly (abdominal) or butt (rectal) pain.  You have thin poop (as thin as a pencil).  You lose weight, and it cannot be explained. MAKE SURE YOU:    Understand these instructions.  Will watch your condition.  Will get help right away if you are not doing well or get worse.   This information is not intended to replace advice given to you by your health care provider. Make sure you discuss any questions you have with your health care provider.   Document Released: 05/06/2008 Document Revised: 12/09/2014 Document Reviewed: 08/30/2013 Elsevier Interactive Patient Education [employer]

## 2015-09-11 NOTE — ED Notes (Signed)
Pt placed on bedpan for urge to void, states disappointment that she is having diff voiding since the fall yesterday

## 2015-09-11 NOTE — ED Notes (Signed)
Pt states that she fell tonight attempting to go to the bathroom alone without waking her husband, pt states that she was here last night for a fall and obtained a fx'd rt ankle, pt states that tonight when she fell she injured her rt ankle further and is having pain in the rt hip, but states the rt ankle hurts worse. Pt also states since her fall yesterday she has been having diff voiding, she states that she had a bladder sling surg in January and hasn't had a problem until she fell yesterday, pt states that she has been sitting on the toilet for at least 30 min attempting to void and has been dribbiling a lot, pt denies pain in her pelvic area. ocl intact to the rt ankle

## 2015-09-11 NOTE — Telephone Encounter (Signed)
Patient fell and broke ankle / went to ER and they prescribed Oxycodone in addition to regular meds.

## 2015-09-11 NOTE — ED Notes (Signed)
Patient with no complaints at this time. Respirations even and unlabored. Skin warm/dry. Discharge instructions reviewed with patient at this time. Patient given opportunity to voice concerns/ask questions. IV removed per policy and band-aid applied to site. Patient discharged at this time and left Emergency Department, via wheelchair.   

## 2015-09-11 NOTE — ED Provider Notes (Signed)
[facility] Emergency Department Provider Note  ____________________________________________  Time seen: Approximately 2:33 AM  I have reviewed the triage vital signs and the nursing notes.   HISTORY  Chief Complaint Hip Pain; Foot Pain; and Fall    HPI Michelle STRENG is a 54 y.o. female who presents to the ED from home s/p fall. Patient was seen in the ED last eveningfor right distal tibia fracture. She was attempting to go to the bathroom alone without waking her husband prior to arrival when she fell while using her walker. Denies striking head or LOC. Complains of worsening right ankle as well as right hip pain. Patient also notes that since her fall last evening she has been having difficulty voiding. Last bowel movement last evening which was normal for her. Denies recent fever, chills, chest pain, shortness of breath, abdominal pain, nausea, vomiting, diarrhea, neck pain. Nothing makes the pain better. Movement makes the pain worse.   Past Medical History  Diagnosis Date  . Fibromyalgia   . Coronary artery disease   . DJD (degenerative joint disease)   . MI (myocardial infarction) (HCC)   . Seizures (HCC)   . Migraine     There are no active problems to display for this patient.   Past Surgical History  Procedure Laterality Date  . Ureter mesh    . Lumbar disc surgery    . Rotator cuff repair Right   . Knee arthroplasty Left   . Abdominal hysterectomy    . Trigger finger release Left   . Left elbow surgery      Current Outpatient Rx  Name  Route  Sig  Dispense  Refill  . oxyCODONE (ROXICODONE) 5 MG immediate release tablet   Oral   Take 0.5 tablets (2.5 mg total) by mouth every 6 (six) hours as needed for severe pain or breakthrough pain.   10 tablet   0     Allergies Cymbalta; Flagyl; Nsaids; and Vicodin  Family History  Problem Relation Age of Onset  . Breast cancer Maternal Aunt   . Breast cancer Cousin     Social  History Social History  Substance Use Topics  . Smoking status: Current Every Day Smoker -- 0.50 packs/day    Types: Cigarettes  . Smokeless tobacco: Never Used  . Alcohol Use: No    Review of Systems Constitutional: No fever/chills Eyes: No visual changes. ENT: No sore throat. Cardiovascular: Denies chest pain. Respiratory: Denies shortness of breath. Gastrointestinal: No abdominal pain.  No nausea, no vomiting.  No diarrhea.  No constipation. Genitourinary: Negative for dysuria. Musculoskeletal: Positive for right ankle and right hip pain. Negative for back pain. Skin: Negative for rash. Neurological: Negative for headaches, focal weakness or numbness.  10-point ROS otherwise negative.  ____________________________________________   PHYSICAL EXAM:  VITAL SIGNS: ED Triage Vitals  Enc Vitals Group     BP 09/11/15 0141 108/73 mmHg     Pulse Rate 09/11/15 0141 93     Resp 09/11/15 0141 20     Temp 09/11/15 0141 97.7 F (36.5 C)     Temp Source 09/11/15 0141 Oral     SpO2 09/11/15 0141 95 %     Weight 09/11/15 0141 124 lb (56.246 kg)     Height 09/11/15 0141 4' 11.5" (1.511 m)     Head Cir --      Peak Flow --      Pain Score 09/11/15 0143 9     Pain Loc --  Pain Edu? --      Excl. in GC? --     Constitutional: Alert and oriented. Well appearing and in mild acute distress. Eyes: Conjunctivae are normal. PERRL. EOMI. Head: Atraumatic. Nose: No congestion/rhinnorhea. Mouth/Throat: Mucous membranes are moist.  Oropharynx non-erythematous. Neck: No stridor. No cervical spine tenderness to palpation. Cardiovascular: Normal rate, regular rhythm. Grossly normal heart sounds.  Good peripheral circulation. Respiratory: Normal respiratory effort.  No retractions. Lungs CTAB. Gastrointestinal: Soft and nontender. No distention. No abdominal bruits. No CVA tenderness. Musculoskeletal: Pelvis stable. Right hip tender to palpation with limited range of motion secondary to  pain. Right knee within normal limits. Right lower extremity is placed in splint from last evening. Brisk, less than 5 second capillary refill. Toes symmetrically warm. 2+ femoral and popliteal pulses. Neurologic:  Normal speech and language. No gross focal neurologic deficits are appreciated. Gait not tested.  Skin:  Skin is warm, dry and intact. No rash noted. Psychiatric: Mood and affect are normal. Speech and behavior are normal.  ____________________________________________   LABS (all labs ordered are listed, but only abnormal results are displayed)  Labs Reviewed  CBC WITH DIFFERENTIAL/PLATELET - Abnormal; Notable for the following:    WBC 11.6 (*)    MCV 100.9 (*)    MCH 34.8 (*)    Neutro Abs 7.8 (*)    All other components within normal limits  BASIC METABOLIC PANEL   ____________________________________________  EKG  None ____________________________________________  RADIOLOGY  Right ankle x-rays (viewed by me, interpreted per Dr. Phill Myron): 1. Grossly stable position and alignment of oblique minimally displaced distal right fibular fracture. 2. No new acute osseous abnormality about the ankle.  Right foot x-rays (viewed by me, interpreted per Dr. Phill Myron): 1. No acute abnormality about the foot. 2. Stable mildly displaced of fracture through the distal right fibula.  Right hip x-rays protheses viewed by me, interpreted per Dr. Andria Meuse): Negative.  KUB (view by me, interpreted per Dr. Andria Meuse): Normal nonobstructive bowel gas pattern. Stool-filled colon.  ____________________________________________   PROCEDURES  Procedure(s) performed: None  Critical Care performed: No  ____________________________________________   INITIAL IMPRESSION / ASSESSMENT AND PLAN / ED COURSE  Pertinent labs & imaging results that were available during my care of the patient were reviewed by me and considered in my medical decision making (see chart for  details).  54 year old female s/p mechanical fall with right hip and worsening right ankle pain. Also with complaints of urinary retention. Bladder scan by nurse reveals approximately 500 mL in bladder. Patient has been unable to void on bed pan. Will place Foley catheter, check basic lab work including renal function, obtain imaging studies and administer IV analgesia.  ----------------------------------------- 3:59 AM on 09/11/2015 -----------------------------------------  Patient improved. Updated patient and spouse of imaging and laboratory results. Patient requesting to have Foley catheter removed. I discuss risks and benefits with her and her spouse; patient verbalizes risk of returning to the ED for urinary retention and desires to have Foley removed. Will prescribe gentle laxative as patient will be taking opiate medications for her ankle fracture. Strongly encouraged patient to seek assistance from her spouse when needing to urinate. Strict return precautions given. Both verbalize understanding and agree with plan of care. ____________________________________________   FINAL CLINICAL IMPRESSION(S) / ED DIAGNOSES  Final diagnoses:  Fall, initial encounter  Contusion, hip, right, initial encounter  Ankle pain, right  Urinary retention  Constipation, unspecified constipation type      Irean Hong, MD 09/11/15 [phone]

## 2015-09-12 ENCOUNTER — Encounter: Payer: Self-pay | Admitting: Pain Medicine

## 2015-09-12 ENCOUNTER — Ambulatory Visit: Payer: Medicare Other | Attending: Pain Medicine | Admitting: Pain Medicine

## 2015-09-12 VITALS — BP 97/62 | HR 88 | Temp 98.2°F | Resp 18 | Ht 59.5 in | Wt 125.0 lb

## 2015-09-12 DIAGNOSIS — I10 Essential (primary) hypertension: Secondary | ICD-10-CM | POA: Insufficient documentation

## 2015-09-12 DIAGNOSIS — E119 Type 2 diabetes mellitus without complications: Secondary | ICD-10-CM | POA: Insufficient documentation

## 2015-09-12 DIAGNOSIS — M47816 Spondylosis without myelopathy or radiculopathy, lumbar region: Secondary | ICD-10-CM | POA: Insufficient documentation

## 2015-09-12 DIAGNOSIS — M549 Dorsalgia, unspecified: Secondary | ICD-10-CM | POA: Diagnosis present

## 2015-09-12 DIAGNOSIS — J449 Chronic obstructive pulmonary disease, unspecified: Secondary | ICD-10-CM | POA: Insufficient documentation

## 2015-09-12 DIAGNOSIS — M47892 Other spondylosis, cervical region: Secondary | ICD-10-CM | POA: Diagnosis not present

## 2015-09-12 DIAGNOSIS — M542 Cervicalgia: Secondary | ICD-10-CM | POA: Insufficient documentation

## 2015-09-12 DIAGNOSIS — F1190 Opioid use, unspecified, uncomplicated: Secondary | ICD-10-CM | POA: Diagnosis not present

## 2015-09-12 DIAGNOSIS — M19039 Primary osteoarthritis, unspecified wrist: Secondary | ICD-10-CM | POA: Insufficient documentation

## 2015-09-12 DIAGNOSIS — F329 Major depressive disorder, single episode, unspecified: Secondary | ICD-10-CM | POA: Diagnosis not present

## 2015-09-12 DIAGNOSIS — M79603 Pain in arm, unspecified: Secondary | ICD-10-CM | POA: Insufficient documentation

## 2015-09-12 DIAGNOSIS — Z5181 Encounter for therapeutic drug level monitoring: Secondary | ICD-10-CM | POA: Insufficient documentation

## 2015-09-12 DIAGNOSIS — M545 Low back pain: Secondary | ICD-10-CM

## 2015-09-12 DIAGNOSIS — F17210 Nicotine dependence, cigarettes, uncomplicated: Secondary | ICD-10-CM | POA: Diagnosis not present

## 2015-09-12 DIAGNOSIS — M25511 Pain in right shoulder: Secondary | ICD-10-CM | POA: Diagnosis not present

## 2015-09-12 DIAGNOSIS — I252 Old myocardial infarction: Secondary | ICD-10-CM | POA: Diagnosis not present

## 2015-09-12 DIAGNOSIS — M47812 Spondylosis without myelopathy or radiculopathy, cervical region: Secondary | ICD-10-CM | POA: Insufficient documentation

## 2015-09-12 DIAGNOSIS — G43909 Migraine, unspecified, not intractable, without status migrainosus: Secondary | ICD-10-CM | POA: Diagnosis not present

## 2015-09-12 DIAGNOSIS — M797 Fibromyalgia: Secondary | ICD-10-CM | POA: Insufficient documentation

## 2015-09-12 DIAGNOSIS — G8929 Other chronic pain: Secondary | ICD-10-CM

## 2015-09-12 DIAGNOSIS — M5481 Occipital neuralgia: Secondary | ICD-10-CM | POA: Insufficient documentation

## 2015-09-12 DIAGNOSIS — M4726 Other spondylosis with radiculopathy, lumbar region: Secondary | ICD-10-CM | POA: Insufficient documentation

## 2015-09-12 DIAGNOSIS — M961 Postlaminectomy syndrome, not elsewhere classified: Secondary | ICD-10-CM | POA: Insufficient documentation

## 2015-09-12 DIAGNOSIS — E7800 Pure hypercholesterolemia, unspecified: Secondary | ICD-10-CM | POA: Diagnosis not present

## 2015-09-12 DIAGNOSIS — F1120 Opioid dependence, uncomplicated: Secondary | ICD-10-CM

## 2015-09-12 DIAGNOSIS — Z8639 Personal history of other endocrine, nutritional and metabolic disease: Secondary | ICD-10-CM

## 2015-09-12 DIAGNOSIS — S2249XA Multiple fractures of ribs, unspecified side, initial encounter for closed fracture: Secondary | ICD-10-CM

## 2015-09-12 DIAGNOSIS — M79646 Pain in unspecified finger(s): Secondary | ICD-10-CM

## 2015-09-12 DIAGNOSIS — Z79891 Long term (current) use of opiate analgesic: Secondary | ICD-10-CM

## 2015-09-12 DIAGNOSIS — F17200 Nicotine dependence, unspecified, uncomplicated: Secondary | ICD-10-CM | POA: Insufficient documentation

## 2015-09-12 DIAGNOSIS — G894 Chronic pain syndrome: Secondary | ICD-10-CM | POA: Insufficient documentation

## 2015-09-12 DIAGNOSIS — M79644 Pain in right finger(s): Secondary | ICD-10-CM

## 2015-09-12 DIAGNOSIS — Z87898 Personal history of other specified conditions: Secondary | ICD-10-CM

## 2015-09-12 DIAGNOSIS — M25552 Pain in left hip: Secondary | ICD-10-CM | POA: Diagnosis not present

## 2015-09-12 DIAGNOSIS — I2510 Atherosclerotic heart disease of native coronary artery without angina pectoris: Secondary | ICD-10-CM | POA: Diagnosis not present

## 2015-09-12 DIAGNOSIS — Z8679 Personal history of other diseases of the circulatory system: Secondary | ICD-10-CM | POA: Insufficient documentation

## 2015-09-12 DIAGNOSIS — F411 Generalized anxiety disorder: Secondary | ICD-10-CM | POA: Insufficient documentation

## 2015-09-12 DIAGNOSIS — Z8669 Personal history of other diseases of the nervous system and sense organs: Secondary | ICD-10-CM

## 2015-09-12 DIAGNOSIS — F32A Depression, unspecified: Secondary | ICD-10-CM | POA: Insufficient documentation

## 2015-09-12 HISTORY — DX: Pain in right finger(s): M79.644

## 2015-09-12 HISTORY — DX: Personal history of other specified conditions: Z87.898

## 2015-09-12 HISTORY — DX: Essential (primary) hypertension: I10

## 2015-09-12 HISTORY — DX: Personal history of other diseases of the nervous system and sense organs: Z86.69

## 2015-09-12 HISTORY — DX: Personal history of other diseases of the circulatory system: Z86.79

## 2015-09-12 HISTORY — DX: Other chronic pain: G89.29

## 2015-09-12 HISTORY — DX: Pain in right shoulder: M25.511

## 2015-09-12 HISTORY — DX: Personal history of other endocrine, nutritional and metabolic disease: Z86.39

## 2015-09-12 HISTORY — DX: Multiple fractures of ribs, unspecified side, initial encounter for closed fracture: S22.49XA

## 2015-09-12 MED ORDER — OXYCODONE-ACETAMINOPHEN 5-325 MG PO TABS: 1.0000 | ORAL_TABLET | Freq: Four times a day (QID) | ORAL | Status: DC | PRN

## 2015-09-12 NOTE — Progress Notes (Signed)
Safety precautions to be maintained throughout the outpatient stay will include: orient to surroundings, keep bed in low position, maintain call bell within reach at all times, provide assistance with transfer out of bed and ambulation.  

## 2015-09-12 NOTE — Progress Notes (Signed)
Tylenol #3 pill count 61

## 2015-09-12 NOTE — Progress Notes (Signed)
Patient's Name: Michelle Newton MRN: [id number] DOB: 09-May-1961 DOS: 09/12/2015  Primary Reason(s) for Visit: Encounter for Medication Management. CC: Back Pain; Hand Pain; Shoulder Pain; and Neck Pain   HPI:   Michelle Newton is a 54 y.o. year old, female patient, who returns today as an established patient. She has Encounter for therapeutic drug level monitoring; Encounter for long-term use of opiate analgesic; Uncomplicated opioid dependence (HCC); Opiate use; Chronic right shoulder pain; Cervical facet syndrome; Cervical spondylosis; Chronic neck pain; Chronic left hip pain; Lumbar facet syndrome; Lumbar spondylosis; Failed back surgical syndrome; Chronic low back pain; Fibromyalgia; Chronic pain syndrome; Disorder of shoulder; Chronic generalized pain; Moderate COPD (chronic obstructive pulmonary disease) (HCC); Bladder cystocele; Essential (primary) hypertension; H/O acute myocardial infarction; Rotator cuff syndrome; Cervical pain; Chronic thumb pain, bilateral; Upper extremity pain; Dorsalgia of occipito-atlanto-axial region; Occipital neuralgia; Smoker; Generalized anxiety disorder; Depression; Wrist arthritis; Fracture five ribs-closed; Chronic obstructive pulmonary disease (COPD) (HCC); History of seizures; History of migraine; H/O non-insulin dependent diabetes mellitus; Essential hypertension; High cholesterol; Coronary artery disease; History of cardiac arrhythmia; and H/O vertigo on her problem list.. Her primarily concern today is the Back Pain; Hand Pain; Shoulder Pain; and Neck Pain   The patient returns to the clinic today after having had a fall where she apparently twisted or broke her ankle. In addition, she recently had surgery on her right thumb for some chronic arthropathy of that joint. The patient usually takes Tylenol No. 3 but she was provided with some Percocet to take after her fall and she indicates that this will work better. Today she came in to talk about a possible implant  for her pain. Michelle Newton husband was present and we went over both the spinal cord stimulator and intrathecal pumps. At this point, we have decided to change that medication and hold on any implants. Today we have taken position of the patient's Tylenol #3 which we have destroyed and we have been provided the patient with a prescription for the Percocet.  Pharmacotherapy Review: Side-effects or Adverse reactions: None reported. Effectiveness: Described as relatively effective, allowing for increase in activities of daily living (ADL). Onset of action: Within expected pharmacological parameters. Duration of action: Within normal limits for medication. Peak effect: Timing and results are as within normal expected parameters. [place] PMP: Compliant with practice rules and regulations. DST: Compliant with practice rules and regulations. Lab work: No new labs ordered by our practice. Treatment compliance: Compliant. Substance Use Disorder (SUD) Risk Level: Low Planned course of action: Continue therapy as is.  Allergies: Michelle Newton is allergic to cymbalta; flagyl; lyrica; nsaids; and vicodin.  Meds: The patient has a current medication list which includes the following prescription(s): albuterol, albuterol-ipratropium, bisoprolol-hydrochlorothiazide, butalbital-acetaminophen-caffeine, vitamin d, cinnamon, cyclobenzaprine, diazepam, estradiol, fluticasone-salmeterol, lactulose, nitroglycerin, paroxetine, promethazine, rosuvastatin, vitamin e, and oxycodone-acetaminophen. Requested Prescriptions   Signed Prescriptions Disp Refills  . oxyCODONE-acetaminophen (PERCOCET) 5-325 MG tablet 60 tablet 0    Sig: Take 1 tablet by mouth every 6 (six) hours as needed for severe pain.    ROS: Constitutional: Afebrile, no chills, well hydrated and well nourished Gastrointestinal: negative Musculoskeletal:negative Neurological: negative Behavioral/Psych: negative  PFSH: Medical:  Michelle Newton  has a past  medical history of Fibromyalgia; Coronary artery disease; DJD (degenerative joint disease); MI (myocardial infarction) (HCC); Seizures (HCC); Migraine; COPD (chronic obstructive pulmonary disease) (HCC); Asthma; Fibromyalgia; Anxiety; Depression; Hypercholesteremia; Migraine; Vertigo; DJD (degenerative joint disease); Chronic fatigue; Osteoarthritis; CAD (coronary artery disease); H/O acute myocardial infarction (12/14/2014); Chronic thumb pain,  bilateral (09/12/2015); Fracture five ribs-closed (09/12/2015); History of seizures (09/12/2015); History of migraine (09/12/2015); H/O non-insulin dependent diabetes mellitus (09/12/2015); Essential hypertension (09/12/2015); and History of cardiac arrhythmia (09/12/2015). Family: family history includes Breast cancer in her cousin and maternal aunt. Surgical:  has past surgical history that includes ureter mesh; Lumbar disc surgery; Rotator cuff repair (Right); Knee Arthroplasty (Left); Abdominal hysterectomy; Trigger finger release (Left); and left elbow surgery. Tobacco:  reports that she has been smoking Cigarettes.  She has been smoking about 0.50 packs per day. She has never used smokeless tobacco. Alcohol:  reports that she does not drink alcohol. Drug:  reports that she does not use illicit drugs.  Physical Exam: Vitals:  Today's Vitals   09/12/15 1514 09/12/15 1515  BP: 97/62   Pulse: 88   Temp: 98.2 F (36.8 C)   Resp: 18   Height: 4' 11.5" (1.511 m)   Weight: 125 lb (56.7 kg)   SpO2: 97%   PainSc: 9  9   Calculated BMI: Body mass index is 24.83 kg/(m^2). General appearance: alert, cooperative, appears older than stated age, distracted and mild distress Eyes: conjunctivae/corneas clear. PERRL, EOM's intact. Fundi benign. Lungs: No evidence respiratory distress, no audible rales or ronchi and no use of accessory muscles of respiration Neck: no adenopathy, no carotid bruit, no JVD, supple, symmetrical, trachea midline and thyroid not enlarged,  symmetric, no tenderness/mass/nodules Back: symmetric, no curvature. ROM normal. No CVA tenderness. Extremities: edema of the right ankle, due to recent injury. Pulses: 2+ and symmetric Skin: Skin color, texture, turgor normal. No rashes or lesions Neurologic: Gait: The patient is currently ambulating in a wheelchair secondary to this recent injury to her right ankle.    Assessment: Encounter Diagnosis:  Primary Diagnosis: Chronic pain syndrome [G89.4]  Plan: Logen was seen today for back pain, hand pain, shoulder pain and neck pain.  Diagnoses and all orders for this visit:  Chronic pain syndrome  Fibromyalgia  Chronic neck pain  Cervical spondylosis  Chronic right shoulder pain  Opiate use  Uncomplicated opioid dependence (HCC)  Encounter for long-term use of opiate analgesic  Encounter for therapeutic drug level monitoring  H/O vertigo  Other orders -     oxyCODONE-acetaminophen (PERCOCET) 5-325 MG tablet; Take 1 tablet by mouth every 6 (six) hours as needed for severe pain.     Patient Instructions  Implantable Pain Pump, Care After Pain can be treated many different ways. Sometimes, though, pain can be so severe that nothing makes it go away. Then, an implantable pain pump might be suggested. Implantable means it is put inside your body. It delivers pain medication directly into a blood vessel or the area around the spinal cord. Delivering it right to these areas is like giving the medication extra power. As a result, less medication is often needed. That also should mean fewer side effects. If you just had a pain pump implanted, this is how it will work:  During surgery, the pump was put under your skin. It is usually round and about the size of a hockey puck. It may have been placed under the skin of your belly or perhaps below your collarbone.  The pump is attached to a small plastic tube (catheter) that was inserted into a blood vessel or into the area around  the spinal cord.  The pump has a space (reservoir) where the medication is stored. When it is needed, the pump pushes the medicine through the catheter and into your body. Your health  care provider can set the pump to give you a steady flow of medication. Or, it can be set to deliver different amounts of pain medicine at different times.  Your health care provider can refill the reservoir. That is done by inserting a needle through your skin into the pump. AFTER SURGERY   You will stay in a recovery area until the anesthesia has worn off. Your blood pressure and pulse will be checked every so often. If the implantation was an outpatient procedure, you will go home once your body functions are back to normal. Sometimes, but rarely, an overnight stay is needed.  Before you are sent home, your health care team will make sure the pump is working well. They will also make sure you do not have a reaction to the pain medicine or to the anesthesia.  Tell your health care providers how much pain you are having. Be honest. They need this information to make sure you get the right amount of pain medicine.  You will have a cut (incision), 4 to 6 inches long, on your skin. This will be near where the pump has been implanted. You might also have a small incision on your back. Stitches or staples will be keeping all incisions closed. Also, there will probably be a bandage (dressing) over the incisions.  Be sure to ask your health care provider for a list of things you should not do for the first few days after your surgery.  Before leaving, make an appointment for your first check-up. At this visit, your health care provider will check the pump to make sure it is still working properly. And, the pump can be refilled with pain medicine. HOME CARE INSTRUCTIONS   Only take over-the-counter or prescription medicines for pain, discomfort, or fever as directed by your health care provider.  You should be feeling  less pain than before the pump was implanted. Continue to let your health care provider know about your pain. Also explain any side effects you might have. The goal is for the pump to give you enough medicine to control the pain but not so much that you have side effects.  Sometimes fluid leaks from the spinal cord area. This can cause a severe headache (called a spinal headache). The headache can also make you feel dizzy or sick to your stomach. If you have a headache after surgery, let your health care provider know. Treatment for a spinal headache is usually to lie flat on your back. You also should drink fluids and caffeine has been shown to be helpful. Try coffee or tea.  The pain medication used in a pump is sometimes a strong pain reliever (narcotic) that can have side effects. Tell your health care provider if you have any of these reactions:  You feel sick to your stomach.  You are having trouble breathing or feel short of breath.  You feel more sleepy than usual.  Your skin itches.  You have trouble passing urine.  Do not get the incisions wet for several days (or until your health care provider says that it is okay). You might be asked to avoid soaking baths for several weeks and to shower only.  Follow your health care provider's directions for changing the dressing on the incisions.  Wear loose clothing until your incisions are healed. This will keep the skin around them from becoming irritated.  At first, limit your movement and activities.  Do not sleep on your stomach.  Avoid significant  bending and stretching.  Do not raise your arms over your head.  Do not lift anything that weighs more than 5 pounds.  Do not drive for at least two weeks (or until your health care provider says it is okay).  Do not do work around the house (inside or outside) until your health care provider gives you the go-ahead. For example, do not load the dishwasher. Do not vacuum. Do not mow  the yard.  Return to your regular activities gradually. Ask your health care provider whether physical therapy would help. SEEK MEDICAL CARE IF:   An incision becomes red, swollen or painful.  An incision leaks fluid or blood.  You feel sick to your stomach, feel more sleepy than usual, or have itchy skin.  You have trouble urinating or having a bowel movement.  You have a bad headache or back pain.  Your pain is not being relieved by the pump.  You develop a fever of more than 100.73F (38.1C). SEEK IMMEDIATE MEDICAL CARE IF:  You have trouble breathing or feel short of breath.  You have a headache that lasts for more than two days.  You hear the pump beeping.  You have sudden symptoms (back pain, weakness in your legs).  You suddenly lose control over urination or bowel movements.  You develop a fever of more than 102.13F (38.9C).   This information is not intended to replace advice given to you by your health care provider. Make sure you discuss any questions you have with your health care provider.   Document Released: 03/06/2009 Document Revised: 12/09/2014 Document Reviewed: 07/10/2015 Elsevier Interactive Patient Education 2016 [employer] Implantable Pain Pump Home Care An implanted pain pump is a small device, about the size of a hockey puck. It is put under your skin (implanted) during surgery. Attached to it is a catheter (small plastic tube). The tube goes into either a vein or into the space around your spinal cord. The pump has a space (reservoir) where the pain medication is stored. The device pumps it from the reservoir into your body at a regular pre-set rate. Sometimes your health care provider will set the pump to give you pain medicine when you need it. This might be in a steady flow. Or, it might be different amounts at different times. The reservoir can be easily refilled when it runs low. An implanted pump puts the medication right where it needs to go  to relieve your pain. This often means less medication will be needed. That should result in fewer side effects.  HOME CARE  An implanted pump can stay in place for a long time. But do not worry: It can be removed at any time. For example, if your condition changes or if the pump no longer helps, it can be taken out. While you have an implanted pump:  You will need to make regular visits to your health care provider. During these visits:  The pump will be refilled with pain medicine. Your health care provider will insert a needle through your skin into the pump. Medicine will flow through the needle into the reservoir. Refills are usually needed every 4 to 8 weeks. Refill time varies from person to person. Be sure to ask how often your pump will need a refill.  Your medication can be adjusted. The amount you get can be changed. How often the medicine is pumped into your body also can be changed. These changes will depend on how well the device  is reducing your pain. Be honest when describing your pain to your health care providers. They need this information to make sure you get the right amount and right type of pain medicine. It also will depend on side effects. Explain any symptoms you have, even if you do not think they have anything to do with your pain pump. Tell your caregiver if you feel sick to your stomach, if you are overly sleepy, or if your skin itches. Your health care provider will know whether the medicine might be causing these or other side effects. The goal is to give you enough medicine to ease your pain but not so much that you have side effects.  The pump will be checked to make sure it is working properly. The battery in the pump often lasts up to ten years. The pump will beep if a new battery is needed. It also will beep if it needs a refill or if there is another problem. Ask your health care provider whether you can take over-the-counter medicines for aches or fever. Which other  medicines you can take could depend on the type of pain medicine in your pump.  At first, you will need to restrict your movement and activities.  For 6 to 8 weeks:  Do not sleep on your stomach.  Avoid excessive bending and stretching.  Do not raise your arms over your head.  Do not lift anything that weighs more than 5 pounds.  Do not drive for at least two weeks (or until your health care provider says it is okay).  Do not take a tub bath for about a month. Shower only.  Do not do work around the house (inside or outside) until your health care provider gives you the go-ahead. For example, do not load the dishwasher. Do not vacuum. Do not mow the yard. Over the long term, you may not have many restrictions. Just remember to return to your regular activities gradually. Ask your health care provider whether physical therapy would help. You still need to be careful in some situations:  Always keep an eye on the skin that covers the pump. Call your health care provider if it becomes red, warm or swollen. Call if the area is painful or if the incision starts to leak blood or any liquid. These could be signs of infection.  Get a special ID card that proves you have an implanted pump. It is called an Implanted Device Identification Card. To get this, you might need a letter or form signed by your health care provider. Ask your health care provider how to get this ID card. You will need this card when traveling. It is important to have in case of an emergency. Carry it with you at all times.  You should alert airport security checkers that you have an implanted pump. The pump will set off the alarms in the security checkpoints. Show your ID card. Ask to be checked with a security wand.  Do not worry about being around microwave ovens, cell phones, or other electronic devices. They will not harm the pump.  Make sure that family members and close friends know that you have an implanted pump. You  might want to tell some co-workers, too. People need to know this in case of an emergency. SEEK MEDICAL CARE IF:   An incision becomes red, swollen, or painful.  An incision leaks fluid or blood.  You feel sick to your stomach, you feel more sleepy than usual,  or you have itchy skin.  You have trouble urinating or having a bowel movement.  You have a bad headache or back pain.  Your pain is not being relieved by the pump.  You develop a fever of more than 100.5 F (38.1 C). SEEK IMMEDIATE MEDICAL CARE IF:  You have trouble breathing or feel short of breath.  You have a headache that lasts for more than two days.  You hear the pump beeping.  You have sudden symptoms (back pain, weakness in your legs).  You lose control over urination or bowel movements.  You develop a fever of more than 102.0 F (38.9 C).   This information is not intended to replace advice given to you by your health care provider. Make sure you discuss any questions you have with your health care provider.   Document Released: 03/06/2009 Document Revised: 12/09/2014 Document Reviewed: 07/10/2015 Elsevier Interactive Patient Education 2016 [employer].    Medications discontinued today:  Medications Discontinued During This Encounter  Medication Reason  . acetaminophen-codeine (TYLENOL #3) 300-30 MG tablet Discontinued by provider  . oxyCODONE (ROXICODONE) 5 MG immediate release tablet Completed Course   Medications administered today:  Ms. Funaro had no medications administered during this visit.  Primary Care Physician: Dortha Kern, MD Location: [facility] Note by: Sydnee Levans Laban Emperor, M.D, DABA, DABAPM, DABPM, DABIPP, FIPP

## 2015-09-12 NOTE — Patient Instructions (Signed)
Implantable Pain Pump, Care After Pain can be treated many different ways. Sometimes, though, pain can be so severe that nothing makes it go away. Then, an implantable pain pump might be suggested. Implantable means it is put inside your body. It delivers pain medication directly into a blood vessel or the area around the spinal cord. Delivering it right to these areas is like giving the medication extra power. As a result, less medication is often needed. That also should mean fewer side effects. If you just had a pain pump implanted, this is how it will work:  During surgery, the pump was put under your skin. It is usually round and about the size of a hockey puck. It may have been placed under the skin of your belly or perhaps below your collarbone.  The pump is attached to a small plastic tube (catheter) that was inserted into a blood vessel or into the area around the spinal cord.  The pump has a space (reservoir) where the medication is stored. When it is needed, the pump pushes the medicine through the catheter and into your body. Your health care provider can set the pump to give you a steady flow of medication. Or, it can be set to deliver different amounts of pain medicine at different times.  Your health care provider can refill the reservoir. That is done by inserting a needle through your skin into the pump. AFTER SURGERY   You will stay in a recovery area until the anesthesia has worn off. Your blood pressure and pulse will be checked every so often. If the implantation was an outpatient procedure, you will go home once your body functions are back to normal. Sometimes, but rarely, an overnight stay is needed.  Before you are sent home, your health care team will make sure the pump is working well. They will also make sure you do not have a reaction to the pain medicine or to the anesthesia.  Tell your health care providers how much pain you are having. Be honest. They need this  information to make sure you get the right amount of pain medicine.  You will have a cut (incision), 4 to 6 inches long, on your skin. This will be near where the pump has been implanted. You might also have a small incision on your back. Stitches or staples will be keeping all incisions closed. Also, there will probably be a bandage (dressing) over the incisions.  Be sure to ask your health care provider for a list of things you should not do for the first few days after your surgery.  Before leaving, make an appointment for your first check-up. At this visit, your health care provider will check the pump to make sure it is still working properly. And, the pump can be refilled with pain medicine. HOME CARE INSTRUCTIONS   Only take over-the-counter or prescription medicines for pain, discomfort, or fever as directed by your health care provider.  You should be feeling less pain than before the pump was implanted. Continue to let your health care provider know about your pain. Also explain any side effects you might have. The goal is for the pump to give you enough medicine to control the pain but not so much that you have side effects.  Sometimes fluid leaks from the spinal cord area. This can cause a severe headache (called a spinal headache). The headache can also make you feel dizzy or sick to your stomach. If you have a  headache after surgery, let your health care provider know. Treatment for a spinal headache is usually to lie flat on your back. You also should drink fluids and caffeine has been shown to be helpful. Try coffee or tea.  The pain medication used in a pump is sometimes a strong pain reliever (narcotic) that can have side effects. Tell your health care provider if you have any of these reactions:  You feel sick to your stomach.  You are having trouble breathing or feel short of breath.  You feel more sleepy than usual.  Your skin itches.  You have trouble passing  urine.  Do not get the incisions wet for several days (or until your health care provider says that it is okay). You might be asked to avoid soaking baths for several weeks and to shower only.  Follow your health care provider's directions for changing the dressing on the incisions.  Wear loose clothing until your incisions are healed. This will keep the skin around them from becoming irritated.  At first, limit your movement and activities.  Do not sleep on your stomach.  Avoid significant bending and stretching.  Do not raise your arms over your head.  Do not lift anything that weighs more than 5 pounds.  Do not drive for at least two weeks (or until your health care provider says it is okay).  Do not do work around the house (inside or outside) until your health care provider gives you the go-ahead. For example, do not load the dishwasher. Do not vacuum. Do not mow the yard.  Return to your regular activities gradually. Ask your health care provider whether physical therapy would help. SEEK MEDICAL CARE IF:   An incision becomes red, swollen or painful.  An incision leaks fluid or blood.  You feel sick to your stomach, feel more sleepy than usual, or have itchy skin.  You have trouble urinating or having a bowel movement.  You have a bad headache or back pain.  Your pain is not being relieved by the pump.  You develop a fever of more than 100.47F (38.1C). SEEK IMMEDIATE MEDICAL CARE IF:  You have trouble breathing or feel short of breath.  You have a headache that lasts for more than two days.  You hear the pump beeping.  You have sudden symptoms (back pain, weakness in your legs).  You suddenly lose control over urination or bowel movements.  You develop a fever of more than 102.62F (38.9C).   This information is not intended to replace advice given to you by your health care provider. Make sure you discuss any questions you have with your health care  provider.   Document Released: 03/06/2009 Document Revised: 12/09/2014 Document Reviewed: 07/10/2015 Elsevier Interactive Patient Education 2016 [employer] An implanted pain pump is a small device, about the size of a hockey puck. It is put under your skin (implanted) during surgery. Attached to it is a catheter (small plastic tube). The tube goes into either a vein or into the space around your spinal cord. The pump has a space (reservoir) where the pain medication is stored. The device pumps it from the reservoir into your body at a regular pre-set rate. Sometimes your health care provider will set the pump to give you pain medicine when you need it. This might be in a steady flow. Or, it might be different amounts at different times. The reservoir can be easily refilled when it runs  low. An implanted pump puts the medication right where it needs to go to relieve your pain. This often means less medication will be needed. That should result in fewer side effects.  HOME CARE  An implanted pump can stay in place for a long time. But do not worry: It can be removed at any time. For example, if your condition changes or if the pump no longer helps, it can be taken out. While you have an implanted pump:  You will need to make regular visits to your health care provider. During these visits:  The pump will be refilled with pain medicine. Your health care provider will insert a needle through your skin into the pump. Medicine will flow through the needle into the reservoir. Refills are usually needed every 4 to 8 weeks. Refill time varies from person to person. Be sure to ask how often your pump will need a refill.  Your medication can be adjusted. The amount you get can be changed. How often the medicine is pumped into your body also can be changed. These changes will depend on how well the device is reducing your pain. Be honest when describing your pain to your health  care providers. They need this information to make sure you get the right amount and right type of pain medicine. It also will depend on side effects. Explain any symptoms you have, even if you do not think they have anything to do with your pain pump. Tell your caregiver if you feel sick to your stomach, if you are overly sleepy, or if your skin itches. Your health care provider will know whether the medicine might be causing these or other side effects. The goal is to give you enough medicine to ease your pain but not so much that you have side effects.  The pump will be checked to make sure it is working properly. The battery in the pump often lasts up to ten years. The pump will beep if a new battery is needed. It also will beep if it needs a refill or if there is another problem. Ask your health care provider whether you can take over-the-counter medicines for aches or fever. Which other medicines you can take could depend on the type of pain medicine in your pump.  At first, you will need to restrict your movement and activities.  For 6 to 8 weeks:  Do not sleep on your stomach.  Avoid excessive bending and stretching.  Do not raise your arms over your head.  Do not lift anything that weighs more than 5 pounds.  Do not drive for at least two weeks (or until your health care provider says it is okay).  Do not take a tub bath for about a month. Shower only.  Do not do work around the house (inside or outside) until your health care provider gives you the go-ahead. For example, do not load the dishwasher. Do not vacuum. Do not mow the yard. Over the long term, you may not have many restrictions. Just remember to return to your regular activities gradually. Ask your health care provider whether physical therapy would help. You still need to be careful in some situations:  Always keep an eye on the skin that covers the pump. Call your health care provider if it becomes red, warm or swollen.  Call if the area is painful or if the incision starts to leak blood or any liquid. These could be signs of infection.  Get a  special ID card that proves you have an implanted pump. It is called an Implanted Device Identification Card. To get this, you might need a letter or form signed by your health care provider. Ask your health care provider how to get this ID card. You will need this card when traveling. It is important to have in case of an emergency. Carry it with you at all times.  You should alert airport security checkers that you have an implanted pump. The pump will set off the alarms in the security checkpoints. Show your ID card. Ask to be checked with a security wand.  Do not worry about being around microwave ovens, cell phones, or other electronic devices. They will not harm the pump.  Make sure that family members and close friends know that you have an implanted pump. You might want to tell some co-workers, too. People need to know this in case of an emergency. SEEK MEDICAL CARE IF:   An incision becomes red, swollen, or painful.  An incision leaks fluid or blood.  You feel sick to your stomach, you feel more sleepy than usual, or you have itchy skin.  You have trouble urinating or having a bowel movement.  You have a bad headache or back pain.  Your pain is not being relieved by the pump.  You develop a fever of more than 100.5 F (38.1 C). SEEK IMMEDIATE MEDICAL CARE IF:  You have trouble breathing or feel short of breath.  You have a headache that lasts for more than two days.  You hear the pump beeping.  You have sudden symptoms (back pain, weakness in your legs).  You lose control over urination or bowel movements.  You develop a fever of more than 102.0 F (38.9 C).   This information is not intended to replace advice given to you by your health care provider. Make sure you discuss any questions you have with your health care provider.   Document  Released: 03/06/2009 Document Revised: 12/09/2014 Document Reviewed: 07/10/2015 Elsevier Interactive Patient Education [employer]

## 2015-09-12 NOTE — Progress Notes (Signed)
Tylenol #3 tablets quantity 61 tablets wasted witnessed by Sarajane Jews, patient and Jonetta Speak at request of Dr Laban Emperor.

## 2015-09-12 NOTE — Telephone Encounter (Signed)
Whenever the patient calls to let us know that they have obtained medication through another source, make sure that a note is entered in the chart. Preferably, this note should be entered close to the medication section. If necessary, have one of the nurses do the entry. No need to send this information to me. 

## 2015-09-13 ENCOUNTER — Other Ambulatory Visit: Payer: Self-pay | Admitting: Pain Medicine

## 2015-09-17 ENCOUNTER — Emergency Department: Payer: Medicare Other

## 2015-09-17 ENCOUNTER — Emergency Department
Admission: EM | Admit: 2015-09-17 | Discharge: 2015-09-17 | Disposition: A | Discharge status: Home / Self Care | Payer: Medicare Other | Attending: Emergency Medicine | Admitting: Emergency Medicine

## 2015-09-17 DIAGNOSIS — W19XXXA Unspecified fall, initial encounter: Secondary | ICD-10-CM | POA: Diagnosis not present

## 2015-09-17 DIAGNOSIS — Z720 Tobacco use: Secondary | ICD-10-CM | POA: Insufficient documentation

## 2015-09-17 DIAGNOSIS — Z79899 Other long term (current) drug therapy: Secondary | ICD-10-CM | POA: Insufficient documentation

## 2015-09-17 DIAGNOSIS — S46911A Strain of unspecified muscle, fascia and tendon at shoulder and upper arm level, right arm, initial encounter: Secondary | ICD-10-CM | POA: Insufficient documentation

## 2015-09-17 DIAGNOSIS — Z7951 Long term (current) use of inhaled steroids: Secondary | ICD-10-CM | POA: Insufficient documentation

## 2015-09-17 DIAGNOSIS — E119 Type 2 diabetes mellitus without complications: Secondary | ICD-10-CM | POA: Insufficient documentation

## 2015-09-17 DIAGNOSIS — Y998 Other external cause status: Secondary | ICD-10-CM | POA: Diagnosis not present

## 2015-09-17 DIAGNOSIS — I10 Essential (primary) hypertension: Secondary | ICD-10-CM | POA: Diagnosis not present

## 2015-09-17 DIAGNOSIS — S4991XA Unspecified injury of right shoulder and upper arm, initial encounter: Secondary | ICD-10-CM | POA: Diagnosis present

## 2015-09-17 DIAGNOSIS — Y92009 Unspecified place in unspecified non-institutional (private) residence as the place of occurrence of the external cause: Secondary | ICD-10-CM | POA: Diagnosis not present

## 2015-09-17 DIAGNOSIS — Y9301 Activity, walking, marching and hiking: Secondary | ICD-10-CM | POA: Diagnosis not present

## 2015-09-17 MED ORDER — ORPHENADRINE CITRATE ER 100 MG PO TB12: 100.0000 mg | ORAL_TABLET | Freq: Two times a day (BID) | ORAL | Status: DC | PRN

## 2015-09-17 MED ORDER — ORPHENADRINE CITRATE 30 MG/ML IJ SOLN
60.0000 mg | INTRAMUSCULAR | Status: AC
  Administered 2015-09-17: 60 mg via INTRAMUSCULAR
  Filled 2015-09-17: qty 2

## 2015-09-17 MED ORDER — CYCLOBENZAPRINE HCL 10 MG PO TABS: 10.0000 mg | ORAL_TABLET | Freq: Three times a day (TID) | ORAL | Status: DC | PRN

## 2015-09-17 NOTE — ED Provider Notes (Signed)
[facility] Emergency Department Provider Note ____________________________________________  Time seen: 1135  I have reviewed the triage vital signs and the nursing notes.  HISTORY  Chief Complaint  Fall  HPI Michelle Newton is a 54 y.o. female once again returns to the ED for evaluation of injury sustained following a mechanical fall, while on a cam walking boot and 4-point walker with a right arm rest. This is her third consecutive visit for injuries due to mechanical fall, including the initial injury which resulted in a distal fibular fracture on the right leg. She describes thattoday she was going outside to eat some kittens, when she tripped while her right forearm was strapped into her walker. She has a fiberglass cast on the right forearm due to a thumb fracture which is status post internal fixation with K-wires. She describes that the walker got caught over the threshold, lurching the patient forward, and causing her to fall and injuring to her right shoulder. She also notes some pain to the right foot, but admits that she was securely strapped into her walking boot. She denies head injury, loss of consciousness, or laceration. She reports her pain at a 8/10 in triage. Her pain and disability is primarily localized to her right shoulder.  Past Medical History  Diagnosis Date  . Fibromyalgia   . Coronary artery disease   . DJD (degenerative joint disease)   . MI (myocardial infarction) (HCC)   . Seizures (HCC)   . Migraine   . COPD (chronic obstructive pulmonary disease) (HCC)   . Asthma   . Fibromyalgia   . Anxiety   . Depression   . Hypercholesteremia   . Migraine   . Vertigo   . DJD (degenerative joint disease)   . Chronic fatigue   . Osteoarthritis   . CAD (coronary artery disease)   . H/O acute myocardial infarction 12/14/2014  . Chronic thumb pain, bilateral 09/12/2015  . Fracture five ribs-closed 09/12/2015  . History of seizures 09/12/2015   . History of migraine 09/12/2015  . H/O non-insulin dependent diabetes mellitus 09/12/2015  . Essential hypertension 09/12/2015  . History of cardiac arrhythmia 09/12/2015    Patient Active Problem List   Diagnosis Date Noted  . Encounter for therapeutic drug level monitoring 09/12/2015  . Encounter for long-term use of opiate analgesic 09/12/2015  . Uncomplicated opioid dependence (HCC) 09/12/2015  . Opiate use 09/12/2015  . Chronic right shoulder pain 09/12/2015  . Cervical facet syndrome 09/12/2015  . Cervical spondylosis 09/12/2015  . Chronic neck pain 09/12/2015  . Chronic left hip pain 09/12/2015  . Lumbar facet syndrome 09/12/2015  . Lumbar spondylosis 09/12/2015  . 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 Chronic low back pain 09/12/2015  . Fibromyalgia 09/12/2015  . Chronic pain syndrome 09/12/2015  . Chronic thumb pain, bilateral 09/12/2015  . Upper extremity pain 09/12/2015  . Dorsalgia of occipito-atlanto-axial region 09/12/2015  . Occipital neuralgia 09/12/2015  . Smoker 09/12/2015  . Generalized anxiety disorder 09/12/2015  . Depression 09/12/2015  . Wrist arthritis 09/12/2015  . Fracture five ribs-closed 09/12/2015  . Chronic obstructive pulmonary disease (COPD) (HCC) 09/12/2015  . History of seizures 09/12/2015  . History of migraine 09/12/2015  . H/O non-insulin dependent diabetes mellitus 09/12/2015  . Essential hypertension 09/12/2015  . High cholesterol 09/12/2015  . Coronary artery disease 09/12/2015  . History of cardiac arrhythmia 09/12/2015  . H/O vertigo 09/12/2015  . Cervical pain 07/19/2015  . Disorder of shoulder 07/10/2015  . Chronic  generalized pain 12/14/2014  . Moderate COPD (chronic obstructive pulmonary disease) (HCC) 12/14/2014  . Essential (primary) hypertension 12/14/2014  . H/O acute myocardial infarction 12/14/2014  . Rotator cuff syndrome 11/15/2014  . Bladder cystocele 07/29/2014    Past Surgical History  Procedure  Laterality Date  . Ureter mesh    . Lumbar disc surgery    . Rotator cuff repair Right   . Knee arthroplasty Left   . Abdominal hysterectomy    . Trigger finger release Left   . Left elbow surgery      Current Outpatient Rx  Name  Route  Sig  Dispense  Refill  . albuterol (PROVENTIL) (2.5 MG/3ML) 0.083% nebulizer solution   Nebulization   Take 2.5 mg by nebulization every 6 (six) hours as needed for wheezing or shortness of breath.         Marland Kitchen albuterol-ipratropium (COMBIVENT) 18-103 MCG/ACT inhaler   Inhalation   Inhale 2 puffs into the lungs every 6 (six) hours as needed for wheezing or shortness of breath.         . bisoprolol-hydrochlorothiazide (ZIAC) 10-6.25 MG tablet   Oral   Take 1 tablet by mouth daily.         . butalbital-acetaminophen-caffeine (FIORICET, ESGIC) 50-325-40 MG tablet   Oral   Take 1 tablet by mouth every 4 (four) hours as needed for headache.         . Cholecalciferol (VITAMIN D) 2000 UNITS tablet   Oral   Take 2,000 Units by mouth daily.         Marland Kitchen Cinnamon ([facility] CINNAMON) 500 MG capsule   Oral   Take 500 mg by mouth daily.         . cyclobenzaprine (FLEXERIL) 10 MG tablet   Oral   Take 10 mg by mouth 3 (three) times daily as needed for muscle spasms.         . cyclobenzaprine (FLEXERIL) 10 MG tablet   Oral   Take 1 tablet (10 mg total) by mouth 3 (three) times daily as needed for muscle spasms.   20 tablet   0   . diazepam (VALIUM) 5 MG tablet   Oral   Take 5 mg by mouth every 8 (eight) hours as needed for anxiety.         Marland Kitchen estradiol (CLIMARA - DOSED IN MG/24 HR) 0.1 mg/24hr patch   Transdermal   Place 0.1 mg onto the skin once a week.         . Fluticasone-Salmeterol (ADVAIR) 100-50 MCG/DOSE AEPB   Inhalation   Inhale 1 puff into the lungs 2 (two) times daily.         Marland Kitchen lactulose (CHRONULAC) 10 GM/15ML solution   Oral   Take 30 mLs (20 g total) by mouth daily as needed for mild constipation.   120 mL   0   .  nitroGLYCERIN (NITROSTAT) 0.4 MG SL tablet   Sublingual   Place 0.4 mg under the tongue every 5 (five) minutes as needed for chest pain.         Marland Kitchen oxyCODONE-acetaminophen (PERCOCET) 5-325 MG tablet   Oral   Take 1 tablet by mouth every 6 (six) hours as needed for severe pain.   60 tablet   0     Do not place this medication, or any other prescri ...   . PARoxetine (PAXIL) 20 MG tablet   Oral   Take 20 mg by mouth daily.         Marland Kitchen  promethazine (PHENERGAN) 25 MG tablet   Oral   Take 25 mg by mouth every 6 (six) hours as needed for nausea or vomiting.         . rosuvastatin (CRESTOR) 20 MG tablet   Oral   Take 20 mg by mouth daily.         . vitamin E 1000 UNIT capsule   Oral   Take 1,000 Units by mouth daily.          Allergies Cymbalta; Flagyl; Lyrica; Nsaids; and Vicodin  Family History  Problem Relation Age of Onset  . Breast cancer Maternal Aunt   . Breast cancer Cousin     Social History Social History  Substance Use Topics  . Smoking status: Current Every Day Smoker -- 0.50 packs/day    Types: Cigarettes  . Smokeless tobacco: Never Used  . Alcohol Use: No   Review of Systems  Constitutional: Negative for fever. Eyes: Negative for visual changes. ENT: Negative for sore throat. Cardiovascular: Negative for chest pain. Respiratory: Negative for shortness of breath. Gastrointestinal: Negative for abdominal pain, vomiting and diarrhea. Genitourinary: Negative for dysuria. Musculoskeletal: Negative for back pain. Right shoulder and foot pain as above.  Skin: Negative for rash. Neurological: Negative for headaches, focal weakness or numbness. ____________________________________________  PHYSICAL EXAM:  VITAL SIGNS: ED Triage Vitals  Enc Vitals Group     BP 09/17/15 1042 118/93 mmHg     Pulse Rate 09/17/15 1042 73     Resp 09/17/15 1042 18     Temp 09/17/15 1042 97.6 F (36.4 C)     Temp Source 09/17/15 1042 Oral     SpO2 09/17/15 1042 96  %     Weight 09/17/15 1042 125 lb (56.7 kg)     Height 09/17/15 1042 4' 11.5" (1.511 m)     Head Cir --      Peak Flow --      Pain Score 09/17/15 1046 8     Pain Loc --      Pain Edu? --      Excl. in GC? --    Constitutional: Alert and oriented. Well appearing and in no distress. Head: Normocephalic and atraumatic.      Eyes: Conjunctivae are normal. PERRL. Normal extraocular movements      Ears: Canals clear. TMs intact bilaterally.   Nose: No congestion/rhinorrhea.   Mouth/Throat: Mucous membranes are moist.   Neck: Supple. No thyromegaly. Hematological/Lymphatic/Immunological: No cervical lymphadenopathy. Cardiovascular: Normal rate, regular rhythm.  Respiratory: Normal respiratory effort. No wheezes/rales/rhonchi. Gastrointestinal: Soft and nontender. No distention. Musculoskeletal: Self-limited shoulder exam without evidence of dislocation, sulcus sign, or deformity. Patient were nontender to palpation over the clavicle. Nontender with normal range of motion in all extremities. Cam walker in place to right foot.  Neurologic:  Normal gait without ataxia. Normal speech and language. No gross focal neurologic deficits are appreciated. Skin:  Skin is warm, dry and intact. No rash noted. Psychiatric: Mood and affect are normal. Patient exhibits appropriate insight and judgment. ____________________________________________   RADIOLOGY Right Shoulder  IMPRESSION: 1. No acute radiographic abnormality of the right shoulder. 2. High-riding humeral head suggesting underlying rotator cuff pathology. In addition, there is increasing calcification in the lateral aspect of the rotator cuff tendon indicative of calcific Tendinopathy.  I, Tyton Abdallah, Charlesetta Ivory, personally viewed and evaluated these images (plain radiographs) as part of my medical decision making.  ____________________________________________  PROCEDURES  Norflex 60 mg  IM ____________________________________________  INITIAL IMPRESSION / ASSESSMENT  AND PLAN / ED COURSE  Radiology results reviewed with patient and husband. Patient is advised that there is no acute fracture or dislocation on plain films. She is advised to rest, ice, and elevate the right shoulder for comfort. She may dose her home medications as previously prescribed. She is requesting at this time a prescription for Norflex, to dose in lieu of her Flexeril. However provide that prescription to her as requested. She will be followed up by her primary care provider, pain management provider, and orthopedic provider as scheduled. Patient again is encouraged to remain seated when she does not have the assistance of her husband to ambulate in and out of the house. ____________________________________________  FINAL CLINICAL IMPRESSION(S) / ED DIAGNOSES  Final diagnoses:  Fall at home, initial encounter  Shoulder strain, right, initial encounter      Lissa Hoard, PA-C 09/17/15 1624  Loleta Rose, MD 09/18/15 1353

## 2015-09-17 NOTE — ED Notes (Signed)
AAOx3.  Skin warm and dry.  NAD 

## 2015-09-17 NOTE — ED Notes (Signed)
Pt reports she was going outside to feed the kittens with her walker and tripped. States she fell forward. Denies hitting her head. Pain in right shoulder and right foot. That is currently fractured per pt. Pt has boot cast currently on right foot.

## 2015-09-17 NOTE — Discharge Instructions (Signed)
Your exam and x-ray are normal today following your fall.  There is no evidence of fracture or dislocation to the shoulder. Continue to dose your home medications for pain. Take the Flexeril as prescribed.  Apply ice to reduce pain and inflammation.  Follow-up with [facility] CapriJohn Wolfe for ongoing pain.

## 2015-09-18 ENCOUNTER — Telehealth: Payer: Self-pay

## 2015-09-18 NOTE — Telephone Encounter (Signed)
Pt fell yesterday she went to the ER and they gave her a muscle relaxer pt wants to discuss the meds that Dr. Dorris CarnesN prescribed her while taking this muscle relaxer.

## 2015-09-19 NOTE — Telephone Encounter (Signed)
Fell yesterday, went to ED, given Norflex. Nothing broken. Pain in ankle. Wants to know if she can take 1 extra Oxycodone per day for now until ankle is better.

## 2015-09-21 ENCOUNTER — Encounter: Payer: Self-pay | Admitting: Pain Medicine

## 2015-09-21 ENCOUNTER — Ambulatory Visit: Payer: Medicare Other | Attending: Pain Medicine | Admitting: Pain Medicine

## 2015-09-21 VITALS — BP 104/73 | HR 90 | Temp 98.4°F | Resp 18 | Ht 59.0 in | Wt 123.0 lb

## 2015-09-21 DIAGNOSIS — R52 Pain, unspecified: Secondary | ICD-10-CM | POA: Diagnosis not present

## 2015-09-21 DIAGNOSIS — I252 Old myocardial infarction: Secondary | ICD-10-CM | POA: Diagnosis not present

## 2015-09-21 DIAGNOSIS — F1190 Opioid use, unspecified, uncomplicated: Secondary | ICD-10-CM | POA: Insufficient documentation

## 2015-09-21 DIAGNOSIS — G8929 Other chronic pain: Secondary | ICD-10-CM

## 2015-09-21 DIAGNOSIS — E559 Vitamin D deficiency, unspecified: Secondary | ICD-10-CM | POA: Diagnosis not present

## 2015-09-21 DIAGNOSIS — M79606 Pain in leg, unspecified: Secondary | ICD-10-CM | POA: Diagnosis present

## 2015-09-21 DIAGNOSIS — Z9889 Other specified postprocedural states: Secondary | ICD-10-CM | POA: Insufficient documentation

## 2015-09-21 DIAGNOSIS — J449 Chronic obstructive pulmonary disease, unspecified: Secondary | ICD-10-CM | POA: Diagnosis not present

## 2015-09-21 DIAGNOSIS — R569 Unspecified convulsions: Secondary | ICD-10-CM | POA: Insufficient documentation

## 2015-09-21 DIAGNOSIS — G894 Chronic pain syndrome: Secondary | ICD-10-CM | POA: Diagnosis not present

## 2015-09-21 DIAGNOSIS — I2510 Atherosclerotic heart disease of native coronary artery without angina pectoris: Secondary | ICD-10-CM | POA: Diagnosis not present

## 2015-09-21 DIAGNOSIS — M797 Fibromyalgia: Secondary | ICD-10-CM | POA: Insufficient documentation

## 2015-09-21 DIAGNOSIS — Z79891 Long term (current) use of opiate analgesic: Secondary | ICD-10-CM | POA: Diagnosis not present

## 2015-09-21 DIAGNOSIS — G43909 Migraine, unspecified, not intractable, without status migrainosus: Secondary | ICD-10-CM | POA: Insufficient documentation

## 2015-09-21 DIAGNOSIS — M5412 Radiculopathy, cervical region: Secondary | ICD-10-CM

## 2015-09-21 DIAGNOSIS — F329 Major depressive disorder, single episode, unspecified: Secondary | ICD-10-CM | POA: Insufficient documentation

## 2015-09-21 DIAGNOSIS — R5382 Chronic fatigue, unspecified: Secondary | ICD-10-CM

## 2015-09-21 DIAGNOSIS — E119 Type 2 diabetes mellitus without complications: Secondary | ICD-10-CM | POA: Diagnosis not present

## 2015-09-21 DIAGNOSIS — Z8489 Family history of other specified conditions: Secondary | ICD-10-CM

## 2015-09-21 DIAGNOSIS — G9332 Myalgic encephalomyelitis/chronic fatigue syndrome: Secondary | ICD-10-CM

## 2015-09-21 MED ORDER — VITAMIN D 50 MCG (2000 UT) PO TABS: 2000.0000 [IU] | ORAL_TABLET | Freq: Every day | ORAL | Status: AC

## 2015-09-21 MED ORDER — OXYCODONE-ACETAMINOPHEN 5-325 MG PO TABS: 1.0000 | ORAL_TABLET | Freq: Every day | ORAL | Status: DC | PRN

## 2015-09-21 NOTE — Progress Notes (Signed)
Safety precautions to be maintained throughout the outpatient stay will include: orient to surroundings, keep bed in low position, maintain call bell within reach at all times, provide assistance with transfer out of bed and ambulation.  

## 2015-09-21 NOTE — Telephone Encounter (Signed)
Please remind the patient and the pharmacy that my policy is not to fax or call any prescriptions. Patients must attend their appointments at which time, after a proper assessment, the prescriptions may be refilled, or the medication/dose changed, if necessary. If the patient feels that this needs to be addressed as soon as possible, please work the patient into an earlier appointment or a cancellation. 

## 2015-09-21 NOTE — Progress Notes (Signed)
Patient's Name: Michelle Newton MRN: [id number] DOB: [birth year] DOS: 09/21/2015  Primary Reason(s) for Visit: Encounter for Medication Management. CC: Leg Pain   HPI:   Michelle Newton is a 54 y.o. year old, female patient, who returns today as an established patient. She has Encounter for therapeutic drug level monitoring; Encounter for long-term use of opiate analgesic; Uncomplicated opioid dependence (HCC); Opiate use; Chronic right shoulder pain; Cervical facet syndrome; Cervical spondylosis; Chronic neck pain; Chronic left hip pain; Lumbar facet syndrome; Lumbar spondylosis; Failed back surgical syndrome; Chronic low back pain; Fibromyalgia; Chronic pain syndrome; Disorder of shoulder; Chronic generalized pain; Moderate COPD (chronic obstructive pulmonary disease) (HCC); Bladder cystocele; Essential (primary) hypertension; H/O acute myocardial infarction; Rotator cuff syndrome; Cervical pain; Chronic thumb pain, bilateral; Upper extremity pain; Dorsalgia of occipito-atlanto-axial region; Occipital neuralgia; Smoker; Generalized anxiety disorder; Depression; Wrist arthritis; Fracture five ribs-closed; Chronic obstructive pulmonary disease (COPD) (HCC); History of seizures; History of migraine; H/O non-insulin dependent diabetes mellitus; Essential hypertension; High cholesterol; Coronary artery disease; History of cardiac arrhythmia; H/O vertigo; Acute pain; Vitamin D insufficiency; Chronic radicular cervical pain, left-sided; Chronic fatigue syndrome; Family history of chronic pain; History of knee surgery; and History of elbow surgery on her problem list.. Her primarily concern today is the Leg Pain   Today the patient was provided with enough medication to last until January. The patient was instructed today to do an Internet search on anti-inflammatory foods since she cannot take any nonsteroidal anti-inflammatory drugs secondary to her gastrointestinal problems.  Pharmacotherapy Review: Side-effects  or Adverse reactions: None reported. Effectiveness: Described as relatively effective but with some room for improvement. Onset of action: Within expected pharmacological parameters. Duration of action: Within normal limits for medication. Peak effect: Timing and results are as within normal expected parameters. [place] PMP: Compliant with practice rules and regulations. DST: Compliant with practice rules and regulations. Lab work: No new labs ordered by our practice. Treatment compliance: Compliant. Substance Use Disorder (SUD) Risk Level: Low Planned course of action: Today we will increase her dose to 5 tablets per day. The patient was informed that once she healed from the leg fracture and the right thumb surgery, we'll probably go to 4 a day. She understood and accepted.  Allergies: Michelle Newton is allergic to cymbalta; flagyl; lyrica; nsaids; and vicodin.  Meds: The patient has a current medication list which includes the following prescription(s): albuterol, albuterol-ipratropium, bisoprolol-hydrochlorothiazide, butalbital-acetaminophen-caffeine, vitamin d, cinnamon, diazepam, estradiol, fluticasone-salmeterol, lactulose, nitroglycerin, orphenadrine, oxycodone-acetaminophen, paroxetine, probiotic product, promethazine, rosuvastatin, vitamin e, oxycodone-acetaminophen, and oxycodone-acetaminophen. Requested Prescriptions   Signed Prescriptions Disp Refills  . oxyCODONE-acetaminophen (PERCOCET) 5-325 MG tablet 150 tablet 0    Sig: Take 1 tablet by mouth 5 (five) times daily as needed for severe pain.  . Cholecalciferol (VITAMIN D) 2000 UNITS tablet 30 tablet PRN    Sig: Take 1 tablet (2,000 Units total) by mouth daily.  Marland Kitchen oxyCODONE-acetaminophen (PERCOCET) 5-325 MG tablet 150 tablet 0    Sig: Take 1 tablet by mouth 5 (five) times daily as needed for severe pain.  Marland Kitchen oxyCODONE-acetaminophen (PERCOCET) 5-325 MG tablet 150 tablet 0    Sig: Take 1 tablet by mouth 5 (five) times daily as needed  for severe pain.    ROS: Constitutional: Afebrile, no chills, well hydrated and well nourished Gastrointestinal: negative Musculoskeletal:negative Neurological: negative Behavioral/Psych: negative  PFSH: Medical:  Michelle Newton  has a past medical history of Fibromyalgia; Coronary artery disease; DJD (degenerative joint disease); MI (myocardial infarction) (HCC); Seizures (HCC); Migraine; COPD (chronic  obstructive pulmonary disease) (HCC); Asthma; Fibromyalgia; Anxiety; Depression; Hypercholesteremia; Migraine; Vertigo; DJD (degenerative joint disease); Chronic fatigue; Osteoarthritis; CAD (coronary artery disease); H/O acute myocardial infarction (12/14/2014); Chronic thumb pain, bilateral (09/12/2015); Fracture five ribs-closed (09/12/2015); History of seizures (09/12/2015); History of migraine (09/12/2015); H/O non-insulin dependent diabetes mellitus (09/12/2015); Essential hypertension (09/12/2015); History of cardiac arrhythmia (09/12/2015); Family history of chronic pain (09/26/2015); History of knee surgery (09/26/2015); and History of elbow surgery (09/26/2015). Family: family history includes Breast cancer in her cousin and maternal aunt. Surgical:  has past surgical history that includes ureter mesh; Lumbar disc surgery; Rotator cuff repair (Right); Knee Arthroplasty (Left); Abdominal hysterectomy; Trigger finger release (Left); and left elbow surgery. Tobacco:  reports that she has been smoking Cigarettes.  She has been smoking about 0.50 packs per day. She has never used smokeless tobacco. Alcohol:  reports that she does not drink alcohol. Drug:  reports that she does not use illicit drugs.  Physical Exam: Vitals:  Today's Vitals   09/21/15 1030 09/21/15 1032  BP: 104/73   Pulse: 90   Temp: 98.4 F (36.9 C)   TempSrc: Oral   Resp: 18   Height: 4\' 11"  (1.499 m)   Weight: 123 lb (55.792 kg)   SpO2: 98%   PainSc:  8   Calculated BMI: Body mass index is 24.83 kg/(m^2). General  appearance: alert, cooperative, appears stated age and mild distress Eyes: conjunctivae/corneas clear. PERRL, EOM's intact. Fundi benign. Lungs: No evidence respiratory distress, no audible rales or ronchi and no use of accessory muscles of respiration Neck: no adenopathy, no carotid bruit, no JVD, supple, symmetrical, trachea midline and thyroid not enlarged, symmetric, no tenderness/mass/nodules Back: symmetric, no curvature. ROM normal. No CVA tenderness. Extremities: The patient has a cast on her right leg secondary to the recent fracture. Pulses: 2+ and symmetric Skin: Skin color, texture, turgor normal. No rashes or lesions Neurologic: Gait: The patient is currently ambulating wheelchair secondary to recent fracture of her right leg.    Assessment: Encounter Diagnosis:  Primary Diagnosis: Chronic pain syndrome [G89.4]  Plan: Michelle Newton was seen today for leg pain.  Diagnoses and all orders for this visit:  Chronic pain syndrome -     oxyCODONE-acetaminophen (PERCOCET) 5-325 MG tablet; Take 1 tablet by mouth 5 (five) times daily as needed for severe pain. -     oxyCODONE-acetaminophen (PERCOCET) 5-325 MG tablet; Take 1 tablet by mouth 5 (five) times daily as needed for severe pain. -     oxyCODONE-acetaminophen (PERCOCET) 5-325 MG tablet; Take 1 tablet by mouth 5 (five) times daily as needed for severe pain.  Acute pain Comments: Acute on chronic pain. The patient recently broke her right leg.   Encounter for long-term use of opiate analgesic  Opiate use  Vitamin D insufficiency Comments: Due to malabsorption. Orders: -     Cholecalciferol (VITAMIN D) 2000 UNITS tablet; Take 1 tablet (2,000 Units total) by mouth daily.  Chronic radicular cervical pain, left-sided  Chronic fatigue syndrome  Fibromyalgia  Family history of chronic pain  History of knee surgery  History of elbow surgery     There are no Patient Instructions on file for this visit. Medications  discontinued today:  Medications Discontinued During This Encounter  Medication Reason  . oxyCODONE-acetaminophen (PERCOCET) 5-325 MG tablet Reorder  . Cholecalciferol (VITAMIN D) 2000 UNITS tablet Reorder   Medications administered today:  Michelle Newton had no medications administered during this visit.  Primary Care Physician: Dortha KernBLISS, LAURA K, MD [facility] Note by: Sydnee Levans. Laban Emperor, M.D, DABA, DABAPM, DABPM, DABIPP, FIPP

## 2015-09-26 ENCOUNTER — Encounter: Payer: Self-pay | Admitting: Pain Medicine

## 2015-09-26 DIAGNOSIS — G8929 Other chronic pain: Secondary | ICD-10-CM | POA: Insufficient documentation

## 2015-09-26 DIAGNOSIS — R5382 Chronic fatigue, unspecified: Secondary | ICD-10-CM | POA: Insufficient documentation

## 2015-09-26 DIAGNOSIS — Z9889 Other specified postprocedural states: Secondary | ICD-10-CM

## 2015-09-26 DIAGNOSIS — M5412 Radiculopathy, cervical region: Secondary | ICD-10-CM

## 2015-09-26 DIAGNOSIS — G9332 Myalgic encephalomyelitis/chronic fatigue syndrome: Secondary | ICD-10-CM | POA: Insufficient documentation

## 2015-09-26 DIAGNOSIS — Z8489 Family history of other specified conditions: Secondary | ICD-10-CM

## 2015-09-26 HISTORY — DX: Family history of other specified conditions: Z84.89

## 2015-09-26 HISTORY — DX: Other specified postprocedural states: Z98.890

## 2015-09-27 ENCOUNTER — Emergency Department
Admission: EM | Admit: 2015-09-27 | Discharge: 2015-09-27 | Disposition: A | Discharge status: Home / Self Care | Payer: Medicare Other | Attending: Emergency Medicine | Admitting: Emergency Medicine

## 2015-09-27 ENCOUNTER — Emergency Department: Payer: Medicare Other

## 2015-09-27 ENCOUNTER — Encounter: Payer: Self-pay | Admitting: Medical Oncology

## 2015-09-27 DIAGNOSIS — Z7982 Long term (current) use of aspirin: Secondary | ICD-10-CM | POA: Diagnosis not present

## 2015-09-27 DIAGNOSIS — I10 Essential (primary) hypertension: Secondary | ICD-10-CM | POA: Insufficient documentation

## 2015-09-27 DIAGNOSIS — Z7951 Long term (current) use of inhaled steroids: Secondary | ICD-10-CM | POA: Insufficient documentation

## 2015-09-27 DIAGNOSIS — S99911A Unspecified injury of right ankle, initial encounter: Secondary | ICD-10-CM | POA: Insufficient documentation

## 2015-09-27 DIAGNOSIS — Y998 Other external cause status: Secondary | ICD-10-CM | POA: Insufficient documentation

## 2015-09-27 DIAGNOSIS — Y9289 Other specified places as the place of occurrence of the external cause: Secondary | ICD-10-CM | POA: Diagnosis not present

## 2015-09-27 DIAGNOSIS — S79911A Unspecified injury of right hip, initial encounter: Secondary | ICD-10-CM | POA: Diagnosis not present

## 2015-09-27 DIAGNOSIS — Z79899 Other long term (current) drug therapy: Secondary | ICD-10-CM | POA: Diagnosis not present

## 2015-09-27 DIAGNOSIS — E119 Type 2 diabetes mellitus without complications: Secondary | ICD-10-CM | POA: Insufficient documentation

## 2015-09-27 DIAGNOSIS — Y9389 Activity, other specified: Secondary | ICD-10-CM | POA: Insufficient documentation

## 2015-09-27 DIAGNOSIS — Z720 Tobacco use: Secondary | ICD-10-CM | POA: Insufficient documentation

## 2015-09-27 DIAGNOSIS — W19XXXA Unspecified fall, initial encounter: Secondary | ICD-10-CM

## 2015-09-27 DIAGNOSIS — W010XXA Fall on same level from slipping, tripping and stumbling without subsequent striking against object, initial encounter: Secondary | ICD-10-CM | POA: Diagnosis not present

## 2015-09-27 LAB — ETHANOL: Alcohol, Ethyl (B): <5 mg/dL (ref <5)

## 2015-09-27 MED ORDER — FENTANYL CITRATE (PF) 100 MCG/2ML IJ SOLN
50.0000 ug | Freq: Once | INTRAMUSCULAR | Status: AC
  Administered 2015-09-27: 50 ug via INTRAVENOUS
  Filled 2015-09-27: qty 2

## 2015-09-27 NOTE — ED Notes (Signed)
Pt was at home when she tripped and fell in the bath room using her walker. Pt c/o rt ankle pain which pt previously injured on 09/11/15 and is wearing ortho boot for. Pt also reports rt hip pain also. Pt denies hitting head or LOC. EMS gave pt 75mcg of fentanyl pta.

## 2015-09-27 NOTE — Discharge Instructions (Signed)
Follow closely with her orthopedic surgeon, return to the emergency room for any new or worrisome symptoms.

## 2015-09-27 NOTE — ED Notes (Signed)
Pt to ED with Right ankle pain after tripping.  Pt had boot on R foot after having tripped over her dog's bed 2 weeks ago and breaking her ankle.  This time, pt reports walking with walker and boot on foot, and tripping on the boot.  Pt now in 9/10 pain on R ankle.

## 2015-09-27 NOTE — ED Notes (Signed)
Pt has short ankle walking boot in place on right ankle.

## 2015-09-27 NOTE — ED Provider Notes (Signed)
[facility] Emergency Department Provider Note  ____________________________________________   I have reviewed the triage vital signs and the nursing notes.   HISTORY  Chief Complaint Ankle Pain and Fall    HPI Michelle Newton is a 54 y.o. female presents today complaining of hip pain after a fall. Patient has a history of chronic pain, has also recurrent falls. She was trying to wash her foot when she lost her balance and fell down heavily on her right hip. She did not pass out. She has had no other injury. She states the pain in her already broken right tib-fib is also more pronounced but she does not believe that to be broken. She did not pass out she did not hit her head she has no other complaints. Patient is requesting narcotic pain medication. She is under pain management by a pain specialist and has chronic narcotic pain issues.  Past Medical History  Diagnosis Date  . Fibromyalgia   . Coronary artery disease   . DJD (degenerative joint disease)   . MI (myocardial infarction) (HCC)   . Seizures (HCC)   . Migraine   . COPD (chronic obstructive pulmonary disease) (HCC)   . Asthma   . Fibromyalgia   . Anxiety   . Depression   . Hypercholesteremia   . Migraine   . Vertigo   . DJD (degenerative joint disease)   . Chronic fatigue   . Osteoarthritis   . CAD (coronary artery disease)   . H/O acute myocardial infarction 12/14/2014  . Chronic thumb pain, bilateral 09/12/2015  . Fracture five ribs-closed 09/12/2015  . History of seizures 09/12/2015  . History of migraine 09/12/2015  . H/O non-insulin dependent diabetes mellitus 09/12/2015  . Essential hypertension 09/12/2015  . History of cardiac arrhythmia 09/12/2015  . Family history of chronic pain 09/26/2015  . History of knee surgery 09/26/2015  . History of elbow surgery 09/26/2015    Left ulnar nerve transposition    Patient Active Problem List   Diagnosis Date Noted  . Chronic radicular  cervical pain, left-sided 09/26/2015  . Chronic fatigue syndrome 09/26/2015  . Family history of chronic pain 09/26/2015  . History of knee surgery 09/26/2015  . History of elbow surgery 09/26/2015  . Acute pain 09/21/2015  . Vitamin D insufficiency 09/21/2015  . Encounter for therapeutic drug level monitoring 09/12/2015  . Encounter for long-term use of opiate analgesic 09/12/2015  . Uncomplicated opioid dependence (HCC) 09/12/2015  . Opiate use 09/12/2015  . Chronic right shoulder pain 09/12/2015  . Cervical facet syndrome 09/12/2015  . Cervical spondylosis 09/12/2015  . Chronic neck pain 09/12/2015  . Chronic left hip pain 09/12/2015  . Lumbar facet syndrome 09/12/2015  . Lumbar spondylosis 09/12/2015  . 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 Chronic low back pain 09/12/2015  . Fibromyalgia 09/12/2015  . Chronic pain syndrome 09/12/2015  . Chronic thumb pain, bilateral 09/12/2015  . Upper extremity pain 09/12/2015  . Dorsalgia of occipito-atlanto-axial region 09/12/2015  . Occipital neuralgia 09/12/2015  . Smoker 09/12/2015  . Generalized anxiety disorder 09/12/2015  . Depression 09/12/2015  . Wrist arthritis 09/12/2015  . Fracture five ribs-closed 09/12/2015  . Chronic obstructive pulmonary disease (COPD) (HCC) 09/12/2015  . History of seizures 09/12/2015  . History of migraine 09/12/2015  . H/O non-insulin dependent diabetes mellitus 09/12/2015  . Essential hypertension 09/12/2015  . High cholesterol 09/12/2015  . Coronary artery disease 09/12/2015  . History of cardiac arrhythmia 09/12/2015  . H/O vertigo  09/12/2015  . Cervical pain 07/19/2015  . Disorder of shoulder 07/10/2015  . Chronic generalized pain 12/14/2014  . Moderate COPD (chronic obstructive pulmonary disease) (HCC) 12/14/2014  . Essential (primary) hypertension 12/14/2014  . H/O acute myocardial infarction 12/14/2014  . Rotator cuff syndrome 11/15/2014  . Bladder cystocele 07/29/2014    Past  Surgical History  Procedure Laterality Date  . Ureter mesh    . Lumbar disc surgery    . Rotator cuff repair Right   . Knee arthroplasty Left   . Abdominal hysterectomy    . Trigger finger release Left   . Left elbow surgery      Current Outpatient Rx  Name  Route  Sig  Dispense  Refill  . albuterol (PROVENTIL) (2.5 MG/3ML) 0.083% nebulizer solution   Nebulization   Take 2.5 mg by nebulization every 6 (six) hours as needed for wheezing or shortness of breath.         Marland Kitchen albuterol-ipratropium (COMBIVENT) 18-103 MCG/ACT inhaler   Inhalation   Inhale 2 puffs into the lungs every 6 (six) hours as needed for wheezing or shortness of breath.         Marland Kitchen aspirin EC 81 MG tablet   Oral   Take 81 mg by mouth daily.         . benzonatate (TESSALON) 200 MG capsule   Oral   Take 200 mg by mouth 4 (four) times daily as needed for cough.          . bisoprolol-hydrochlorothiazide (ZIAC) 10-6.25 MG tablet   Oral   Take 1 tablet by mouth daily.         . butalbital-acetaminophen-caffeine (FIORICET, ESGIC) 50-325-40 MG tablet   Oral   Take 1 tablet by mouth every 4 (four) hours as needed for headache.         . Cholecalciferol (VITAMIN D) 2000 UNITS tablet   Oral   Take 1 tablet (2,000 Units total) by mouth daily.   30 tablet   PRN     Do not place this medication, or any other prescri ...   . Cinnamon ([employer] CINNAMON) 500 MG capsule   Oral   Take 500 mg by mouth daily.         . diazepam (VALIUM) 5 MG tablet   Oral   Take 5 mg by mouth every 8 (eight) hours as needed for anxiety.         . docusate sodium (COLACE) 100 MG capsule   Oral   Take 200 mg by mouth every morning.         Marland Kitchen estradiol (CLIMARA - DOSED IN MG/24 HR) 0.1 mg/24hr patch   Transdermal   Place 0.1 mg onto the skin once a week. On Wednesday         . Fluticasone-Salmeterol (ADVAIR) 100-50 MCG/DOSE AEPB   Inhalation   Inhale 1 puff into the lungs 2 (two) times daily.         .  nitroGLYCERIN (NITROSTAT) 0.4 MG SL tablet   Sublingual   Place 0.4 mg under the tongue every 5 (five) minutes as needed for chest pain.         Marland Kitchen oxyCODONE-acetaminophen (PERCOCET) 5-325 MG tablet   Oral   Take 1 tablet by mouth 5 (five) times daily as needed for severe pain.   150 tablet   0     Do not place this medication, or any other prescri ...   . PARoxetine (PAXIL) 30 MG tablet  Oral   Take 30 mg by mouth daily.         . Probiotic Product (PROBIOTIC ADVANCED PO)   Oral   Take 2 tablets by mouth daily.         . promethazine (PHENERGAN) 25 MG tablet   Oral   Take 25 mg by mouth every 6 (six) hours as needed for nausea or vomiting.         . rosuvastatin (CRESTOR) 20 MG tablet   Oral   Take 20 mg by mouth at bedtime.          . vitamin E 1000 UNIT capsule   Oral   Take 1,000 Units by mouth daily.         Marland Kitchen. lactulose (CHRONULAC) 10 GM/15ML solution   Oral   Take 30 mLs (20 g total) by mouth daily as needed for mild constipation. Patient not taking: Reported on 09/27/2015   120 mL   0   . orphenadrine (NORFLEX) 100 MG tablet   Oral   Take 1 tablet (100 mg total) by mouth 2 (two) times daily as needed for muscle spasms. Patient not taking: Reported on 09/27/2015   20 tablet   0     Allergies Cymbalta; Flagyl; Lyrica; Nsaids; and Vicodin  Family History  Problem Relation Age of Onset  . Breast cancer Maternal Aunt   . Breast cancer Cousin     Social History Social History  Substance Use Topics  . Smoking status: Current Every Day Smoker -- 0.50 packs/day    Types: Cigarettes  . Smokeless tobacco: Never Used  . Alcohol Use: No    Review of Systems Constitutional: No fever/chills Eyes: No visual changes. ENT: No sore throat. No stiff neck no neck pain Cardiovascular: Denies chest pain. Respiratory: Denies shortness of breath. Gastrointestinal:   no vomiting.  No diarrhea.  No constipation. Genitourinary: Negative for  dysuria. Musculoskeletal: Negative lower extremity swelling Skin: Negative for rash. Neurological: Negative for headaches, focal weakness or numbness. 10-point ROS otherwise negative.  ____________________________________________   PHYSICAL EXAM:  VITAL SIGNS: ED Triage Vitals  Enc Vitals Group     BP 09/27/15 1020 118/76 mmHg     Pulse Rate 09/27/15 1020 80     Resp 09/27/15 1020 18     Temp 09/27/15 1020 97.4 F (36.3 C)     Temp Source 09/27/15 1020 Oral     SpO2 09/27/15 1020 97 %     Weight 09/27/15 1020 123 lb (55.792 kg)     Height 09/27/15 1020 4\' 11"  (1.499 m)     Head Cir --      Peak Flow --      Pain Score 09/27/15 1021 9     Pain Loc --      Pain Edu? --      Excl. in GC? --     Constitutional: Alert and oriented. Well appearing and in no acute distress. Eyes: Conjunctivae are normal. PERRL. EOMI. Head: Atraumatic. Nose: No congestion/rhinnorhea. Mouth/Throat: Mucous membranes are moist.  Oropharynx non-erythematous. Neck: No stridor.   Nontender with no meningismus Cardiovascular: Normal rate, regular rhythm. Grossly normal heart sounds.  Good peripheral circulation. Respiratory: Normal respiratory effort.  No retractions. Lungs CTAB. Gastrointestinal: Soft and nontender. No distention. No guarding no rebound Back:  There is no focal tenderness or step off there is no midline tenderness there are no lesions noted. there is no CVA tenderness Musculoskeletal: He can range her hip but there is  tenderness, there is no tenderness to the knee and there is full painless range of motion of the knee, there is no pain to palpation of the lower extremity on the left. There is no low back injury noted, no DVT signs strong distal pulses no edema Neurologic:  Normal speech and language. No gross focal neurologic deficits are appreciated.  Skin:  Skin is warm, dry and intact. No rash noted. Psychiatric: Mood and affect are normal. Speech and behavior are  normal.  ____________________________________________   LABS (all labs ordered are listed, but only abnormal results are displayed)  Labs Reviewed  ETHANOL  URINE DRUG SCREEN, QUALITATIVE ([facility] ONLY)   ____________________________________________  EKG   ____________________________________________  RADIOLOGY   ____________________________________________   PROCEDURES  Procedure(s) performed: None  Critical Care performed: None  ____________________________________________   INITIAL IMPRESSION / ASSESSMENT AND PLAN / ED COURSE  Pertinent labs & imaging results that were available during my care of the patient were reviewed by me and considered in my medical decision making (see chart for details).   patient fell and sustained pain to her right hip and her already broken right lower extremity. Patient has a walker and she is she is supposed to be nonweightbearing on that left lower extremity and she fell because she had taken off her boot to watch her foot. The patient has no evidence of fracture serial exams are reassuring we will place a splint to replace the boot that she left at home and we will advise that her family brings her walker to her possible.  ___________________________________________   FINAL CLINICAL IMPRESSION(S) / ED DIAGNOSES  Final diagnoses:  None     Jeanmarie Plant, MD 09/27/15 1204

## 2015-10-19 ENCOUNTER — Other Ambulatory Visit: Payer: Self-pay | Admitting: Pain Medicine

## 2015-11-07 ENCOUNTER — Emergency Department: Payer: Medicare Other

## 2015-11-07 ENCOUNTER — Encounter: Payer: Self-pay | Admitting: Emergency Medicine

## 2015-11-07 ENCOUNTER — Emergency Department
Admission: EM | Admit: 2015-11-07 | Discharge: 2015-11-07 | Disposition: A | Discharge status: Home / Self Care | Payer: Medicare Other | Attending: Emergency Medicine | Admitting: Emergency Medicine

## 2015-11-07 DIAGNOSIS — I10 Essential (primary) hypertension: Secondary | ICD-10-CM | POA: Insufficient documentation

## 2015-11-07 DIAGNOSIS — Z7982 Long term (current) use of aspirin: Secondary | ICD-10-CM | POA: Diagnosis not present

## 2015-11-07 DIAGNOSIS — Y9289 Other specified places as the place of occurrence of the external cause: Secondary | ICD-10-CM | POA: Insufficient documentation

## 2015-11-07 DIAGNOSIS — S46911A Strain of unspecified muscle, fascia and tendon at shoulder and upper arm level, right arm, initial encounter: Secondary | ICD-10-CM | POA: Insufficient documentation

## 2015-11-07 DIAGNOSIS — X58XXXA Exposure to other specified factors, initial encounter: Secondary | ICD-10-CM | POA: Diagnosis not present

## 2015-11-07 DIAGNOSIS — Y9389 Activity, other specified: Secondary | ICD-10-CM | POA: Diagnosis not present

## 2015-11-07 DIAGNOSIS — E119 Type 2 diabetes mellitus without complications: Secondary | ICD-10-CM | POA: Diagnosis not present

## 2015-11-07 DIAGNOSIS — Y998 Other external cause status: Secondary | ICD-10-CM | POA: Diagnosis not present

## 2015-11-07 DIAGNOSIS — S4991XA Unspecified injury of right shoulder and upper arm, initial encounter: Secondary | ICD-10-CM | POA: Diagnosis present

## 2015-11-07 DIAGNOSIS — F17210 Nicotine dependence, cigarettes, uncomplicated: Secondary | ICD-10-CM | POA: Insufficient documentation

## 2015-11-07 DIAGNOSIS — Z7951 Long term (current) use of inhaled steroids: Secondary | ICD-10-CM | POA: Insufficient documentation

## 2015-11-07 DIAGNOSIS — Z79899 Other long term (current) drug therapy: Secondary | ICD-10-CM | POA: Insufficient documentation

## 2015-11-07 MED ORDER — ORPHENADRINE CITRATE ER 100 MG PO TB12: 100.0000 mg | ORAL_TABLET | Freq: Two times a day (BID) | ORAL | Status: DC

## 2015-11-07 MED ORDER — OXYCODONE-ACETAMINOPHEN 10-325 MG PO TABS: 1.0000 | ORAL_TABLET | Freq: Four times a day (QID) | ORAL | Status: DC | PRN

## 2015-11-07 NOTE — ED Notes (Signed)
Pt has right shoulder pain. Pt states she was hanging a shirt in the closet and felt right shoulder pop.  Limited rom of right shoulder.

## 2015-11-07 NOTE — ED Notes (Signed)
Pt states she went to hang up a shirt in her closet and felt something "pop" in right shoulder

## 2015-11-07 NOTE — ED Provider Notes (Signed)
[facility] Emergency Department Provider Note  ____________________________________________  Time seen: Approximately 4:29 PM  I have reviewed the triage vital signs and the nursing notes.   HISTORY  Chief Complaint Shoulder Pain    HPI Michelle Newton is a 54 y.o. female who presents for evaluation of right shoulder pain. Patient states that she was having a pressure when she felt something "pop" in her right shoulder. Desires x-rays secondary to the pain.   Past Medical History  Diagnosis Date  . Fibromyalgia   . Coronary artery disease   . DJD (degenerative joint disease)   . MI (myocardial infarction) (HCC)   . Seizures (HCC)   . Migraine   . COPD (chronic obstructive pulmonary disease) (HCC)   . Asthma   . Fibromyalgia   . Anxiety   . Depression   . Hypercholesteremia   . Migraine   . Vertigo   . DJD (degenerative joint disease)   . Chronic fatigue   . Osteoarthritis   . CAD (coronary artery disease)   . H/O acute myocardial infarction 12/14/2014  . Chronic thumb pain, bilateral 09/12/2015  . Fracture five ribs-closed 09/12/2015  . History of seizures 09/12/2015  . History of migraine 09/12/2015  . H/O non-insulin dependent diabetes mellitus 09/12/2015  . Essential hypertension 09/12/2015  . History of cardiac arrhythmia 09/12/2015  . Family history of chronic pain 09/26/2015  . History of knee surgery 09/26/2015  . History of elbow surgery 09/26/2015    Left ulnar nerve transposition    Patient Active Problem List   Diagnosis Date Noted  . Chronic radicular cervical pain, left-sided 09/26/2015  . Chronic fatigue syndrome 09/26/2015  . Family history of chronic pain 09/26/2015  . History of knee surgery 09/26/2015  . History of elbow surgery 09/26/2015  . Acute pain 09/21/2015  . Vitamin D insufficiency 09/21/2015  . Encounter for therapeutic drug level monitoring 09/12/2015  . Encounter for long-term use of opiate analgesic  09/12/2015  . Uncomplicated opioid dependence (HCC) 09/12/2015  . Opiate use 09/12/2015  . Chronic right shoulder pain 09/12/2015  . Cervical facet syndrome 09/12/2015  . Cervical spondylosis 09/12/2015  . Chronic neck pain 09/12/2015  . Chronic left hip pain 09/12/2015  . Lumbar facet syndrome 09/12/2015  . Lumbar spondylosis 09/12/2015  . 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 Chronic low back pain 09/12/2015  . Fibromyalgia 09/12/2015  . Chronic pain syndrome 09/12/2015  . Chronic thumb pain, bilateral 09/12/2015  . Upper extremity pain 09/12/2015  . Dorsalgia of occipito-atlanto-axial region 09/12/2015  . Occipital neuralgia 09/12/2015  . Smoker 09/12/2015  . Generalized anxiety disorder 09/12/2015  . Depression 09/12/2015  . Wrist arthritis 09/12/2015  . Fracture five ribs-closed 09/12/2015  . Chronic obstructive pulmonary disease (COPD) (HCC) 09/12/2015  . History of seizures 09/12/2015  . History of migraine 09/12/2015  . H/O non-insulin dependent diabetes mellitus 09/12/2015  . Essential hypertension 09/12/2015  . High cholesterol 09/12/2015  . Coronary artery disease 09/12/2015  . History of cardiac arrhythmia 09/12/2015  . H/O vertigo 09/12/2015  . Cervical pain 07/19/2015  . Disorder of shoulder 07/10/2015  . Chronic generalized pain 12/14/2014  . Moderate COPD (chronic obstructive pulmonary disease) (HCC) 12/14/2014  . Essential (primary) hypertension 12/14/2014  . H/O acute myocardial infarction 12/14/2014  . Rotator cuff syndrome 11/15/2014  . Bladder cystocele 07/29/2014    Past Surgical History  Procedure Laterality Date  . Ureter mesh    . Lumbar disc surgery    .  Rotator cuff repair Right   . Knee arthroplasty Left   . Abdominal hysterectomy    . Trigger finger release Left   . Left elbow surgery      Current Outpatient Rx  Name  Route  Sig  Dispense  Refill  . albuterol (PROVENTIL) (2.5 MG/3ML) 0.083% nebulizer solution   Nebulization    Take 2.5 mg by nebulization every 6 (six) hours as needed for wheezing or shortness of breath.         Marland Kitchen albuterol-ipratropium (COMBIVENT) 18-103 MCG/ACT inhaler   Inhalation   Inhale 2 puffs into the lungs every 6 (six) hours as needed for wheezing or shortness of breath.         Marland Kitchen aspirin EC 81 MG tablet   Oral   Take 81 mg by mouth daily.         . benzonatate (TESSALON) 200 MG capsule   Oral   Take 200 mg by mouth 4 (four) times daily as needed for cough.          . bisoprolol-hydrochlorothiazide (ZIAC) 10-6.25 MG tablet   Oral   Take 1 tablet by mouth daily.         . butalbital-acetaminophen-caffeine (FIORICET, ESGIC) 50-325-40 MG tablet   Oral   Take 1 tablet by mouth every 4 (four) hours as needed for headache.         . Cholecalciferol (VITAMIN D) 2000 UNITS tablet   Oral   Take 1 tablet (2,000 Units total) by mouth daily.   30 tablet   PRN     Do not place this medication, or any other prescri ...   . Cinnamon ([employer] CINNAMON) 500 MG capsule   Oral   Take 500 mg by mouth daily.         . diazepam (VALIUM) 5 MG tablet   Oral   Take 5 mg by mouth every 8 (eight) hours as needed for anxiety.         . docusate sodium (COLACE) 100 MG capsule   Oral   Take 200 mg by mouth every morning.         Marland Kitchen estradiol (CLIMARA - DOSED IN MG/24 HR) 0.1 mg/24hr patch   Transdermal   Place 0.1 mg onto the skin once a week. On Wednesday         . Fluticasone-Salmeterol (ADVAIR) 100-50 MCG/DOSE AEPB   Inhalation   Inhale 1 puff into the lungs 2 (two) times daily.         Marland Kitchen lactulose (CHRONULAC) 10 GM/15ML solution   Oral   Take 30 mLs (20 g total) by mouth daily as needed for mild constipation. Patient not taking: Reported on 09/27/2015   120 mL   0   . nitroGLYCERIN (NITROSTAT) 0.4 MG SL tablet   Sublingual   Place 0.4 mg under the tongue every 5 (five) minutes as needed for chest pain.         . orphenadrine (NORFLEX) 100 MG tablet   Oral    Take 1 tablet (100 mg total) by mouth 2 (two) times daily.   30 tablet   0   . oxyCODONE-acetaminophen (PERCOCET) 10-325 MG tablet   Oral   Take 1 tablet by mouth every 6 (six) hours as needed for pain.   20 tablet   0   . PARoxetine (PAXIL) 30 MG tablet   Oral   Take 30 mg by mouth daily.         Marland Kitchen  Probiotic Product (PROBIOTIC ADVANCED PO)   Oral   Take 2 tablets by mouth daily.         . promethazine (PHENERGAN) 25 MG tablet   Oral   Take 25 mg by mouth every 6 (six) hours as needed for nausea or vomiting.         . rosuvastatin (CRESTOR) 20 MG tablet   Oral   Take 20 mg by mouth at bedtime.          . vitamin E 1000 UNIT capsule   Oral   Take 1,000 Units by mouth daily.           Allergies Cymbalta; Flagyl; Lyrica; Nsaids; and Vicodin  Family History  Problem Relation Age of Onset  . Breast cancer Maternal Aunt   . Breast cancer Cousin     Social History Social History  Substance Use Topics  . Smoking status: Current Every Day Smoker -- 0.50 packs/day    Types: Cigarettes  . Smokeless tobacco: Never Used  . Alcohol Use: No    Review of Systems Constitutional: No fever/chills Eyes: No visual changes. ENT: No sore throat. Cardiovascular: Denies chest pain. Respiratory: Denies shortness of breath. Gastrointestinal: No abdominal pain.  No nausea, no vomiting.  No diarrhea.  No constipation. Genitourinary: Negative for dysuria. Musculoskeletal: Positive for right shoulder pain with limited range of motion and increased pain with abduction and adduction and extension. Skin: Negative for rash. Neurological: Negative for headaches, focal weakness or numbness.  10-point ROS otherwise negative.  ____________________________________________   PHYSICAL EXAM:  VITAL SIGNS: ED Triage Vitals  Enc Vitals Group     BP 11/07/15 1612 130/100 mmHg     Pulse Rate 11/07/15 1612 73     Resp 11/07/15 1612 18     Temp 11/07/15 1612 98 F (36.7 C)      Temp Source 11/07/15 1612 Oral     SpO2 11/07/15 1612 95 %     Weight 11/07/15 1612 115 lb (52.164 kg)     Height 11/07/15 1612 4\' 11"  (1.499 m)     Head Cir --      Peak Flow --      Pain Score 11/07/15 1612 9     Pain Loc --      Pain Edu? --      Excl. in GC? --     Constitutional: Alert and oriented. Well appearing and in no acute distress. Cardiovascular: Normal rate, regular rhythm. Grossly normal heart sounds.  Good peripheral circulation. Respiratory: Normal respiratory effort.  No retractions. Lungs CTAB. Musculoskeletal: Right shoulder with limited range of motion decreased strength with extension increased pain with abduction. Neurologic:  Normal speech and language. No gross focal neurologic deficits are appreciated. No gait instability. Skin:  Skin is warm, dry and intact. No rash noted. Psychiatric: Mood and affect are normal. Speech and behavior are normal.  ____________________________________________   LABS (all labs ordered are listed, but only abnormal results are displayed)  Labs Reviewed - No data to display ____________________________________________  RADIOLOGY  Radiological findings demonstrated a high riding humerus consistent with probable rotator cuff muscle tear. ____________________________________________   PROCEDURES  Procedure(s) performed: None  Critical Care performed: No  ____________________________________________   INITIAL IMPRESSION / ASSESSMENT AND PLAN / ED COURSE  Pertinent labs & imaging results that were available during my care of the patient were reviewed by me and considered in my medical decision making (see chart for details).  Possible rotator cuff tear. High riding  humerus right shoulder pain. Referral given to Dr. Joice Lofts. Rx Norflex and Percocet. ANCCSRS was reviewed prior to dispensing. Patient follow-up as needed or return to the ER. ____________________________________________   FINAL CLINICAL IMPRESSION(S) / ED  DIAGNOSES  Final diagnoses:  Shoulder strain, right, initial encounter      Evangeline Dakin, PA-C 11/07/15 2000  Rockne Menghini, MD 11/07/15 2002

## 2015-11-08 ENCOUNTER — Telehealth: Payer: Self-pay

## 2015-11-08 NOTE — Telephone Encounter (Signed)
Patient states she has torn her rotator cuff again and was calling to notify us that she has received Percocet 10/325 mg #20 and Norflex 100 mg bid.  Wanted to let us know.  Has appointment on Monday.

## 2015-11-13 ENCOUNTER — Encounter: Payer: Self-pay | Admitting: Pain Medicine

## 2015-11-13 ENCOUNTER — Other Ambulatory Visit: Payer: Self-pay | Admitting: Pain Medicine

## 2015-11-13 ENCOUNTER — Ambulatory Visit: Payer: Medicare Other | Attending: Pain Medicine | Admitting: Pain Medicine

## 2015-11-13 VITALS — BP 134/87 | HR 79 | Temp 98.6°F | Resp 14 | Ht 59.5 in | Wt 115.0 lb

## 2015-11-13 DIAGNOSIS — M25552 Pain in left hip: Secondary | ICD-10-CM | POA: Diagnosis not present

## 2015-11-13 DIAGNOSIS — E119 Type 2 diabetes mellitus without complications: Secondary | ICD-10-CM | POA: Diagnosis not present

## 2015-11-13 DIAGNOSIS — I2510 Atherosclerotic heart disease of native coronary artery without angina pectoris: Secondary | ICD-10-CM | POA: Diagnosis not present

## 2015-11-13 DIAGNOSIS — E7800 Pure hypercholesterolemia, unspecified: Secondary | ICD-10-CM | POA: Diagnosis not present

## 2015-11-13 DIAGNOSIS — M25519 Pain in unspecified shoulder: Secondary | ICD-10-CM | POA: Diagnosis present

## 2015-11-13 DIAGNOSIS — M542 Cervicalgia: Secondary | ICD-10-CM | POA: Diagnosis present

## 2015-11-13 DIAGNOSIS — Z79899 Other long term (current) drug therapy: Secondary | ICD-10-CM | POA: Diagnosis not present

## 2015-11-13 DIAGNOSIS — E559 Vitamin D deficiency, unspecified: Secondary | ICD-10-CM | POA: Insufficient documentation

## 2015-11-13 DIAGNOSIS — Z5181 Encounter for therapeutic drug level monitoring: Secondary | ICD-10-CM

## 2015-11-13 DIAGNOSIS — J449 Chronic obstructive pulmonary disease, unspecified: Secondary | ICD-10-CM | POA: Insufficient documentation

## 2015-11-13 DIAGNOSIS — I10 Essential (primary) hypertension: Secondary | ICD-10-CM | POA: Insufficient documentation

## 2015-11-13 DIAGNOSIS — F1190 Opioid use, unspecified, uncomplicated: Secondary | ICD-10-CM | POA: Insufficient documentation

## 2015-11-13 DIAGNOSIS — F329 Major depressive disorder, single episode, unspecified: Secondary | ICD-10-CM | POA: Insufficient documentation

## 2015-11-13 DIAGNOSIS — Z79891 Long term (current) use of opiate analgesic: Secondary | ICD-10-CM | POA: Insufficient documentation

## 2015-11-13 DIAGNOSIS — M797 Fibromyalgia: Secondary | ICD-10-CM | POA: Insufficient documentation

## 2015-11-13 DIAGNOSIS — G8929 Other chronic pain: Secondary | ICD-10-CM | POA: Insufficient documentation

## 2015-11-13 DIAGNOSIS — M5481 Occipital neuralgia: Secondary | ICD-10-CM | POA: Insufficient documentation

## 2015-11-13 DIAGNOSIS — F1120 Opioid dependence, uncomplicated: Secondary | ICD-10-CM | POA: Insufficient documentation

## 2015-11-13 DIAGNOSIS — F17210 Nicotine dependence, cigarettes, uncomplicated: Secondary | ICD-10-CM | POA: Diagnosis not present

## 2015-11-13 DIAGNOSIS — G43909 Migraine, unspecified, not intractable, without status migrainosus: Secondary | ICD-10-CM | POA: Insufficient documentation

## 2015-11-13 DIAGNOSIS — I252 Old myocardial infarction: Secondary | ICD-10-CM | POA: Insufficient documentation

## 2015-11-13 MED ORDER — OXYCODONE-ACETAMINOPHEN 5-325 MG PO TABS: 1.0000 | ORAL_TABLET | Freq: Every day | ORAL | Status: DC | PRN

## 2015-11-13 NOTE — Progress Notes (Signed)
Patient's Name: Michelle Newton MRN: [id number] DOB: 1961-01-20 DOS: 11/13/2015  Primary Reason(s) for Visit: Evaluation of a New Problem CC: Neck Pain and Shoulder Pain   HPI:   Michelle Newton is a 54 y.o. year old, female patient, who returns today as an established patient. She has Encounter for therapeutic drug level monitoring; Encounter for long-term use of opiate analgesic; Uncomplicated opioid dependence (HCC); Opiate use; Chronic right shoulder pain; Cervical facet syndrome; Cervical spondylosis; Chronic neck pain; Chronic left hip pain; Lumbar facet syndrome; Lumbar spondylosis; Failed back surgical syndrome; Chronic low back pain; Fibromyalgia; Chronic pain syndrome; Disorder of shoulder; Chronic generalized pain; Moderate COPD (chronic obstructive pulmonary disease) (HCC); Bladder cystocele; Essential (primary) hypertension; H/O acute myocardial infarction; Rotator cuff syndrome; Cervical pain; Chronic thumb pain, bilateral; Upper extremity pain; Dorsalgia of occipito-atlanto-axial region; Occipital neuralgia; Smoker; Generalized anxiety disorder; Depression; Wrist arthritis; Fracture five ribs-closed; Chronic obstructive pulmonary disease (COPD) (HCC); History of seizures; History of migraine; H/O non-insulin dependent diabetes mellitus; Essential hypertension; High cholesterol; Coronary artery disease; History of cardiac arrhythmia; H/O vertigo; Vitamin D insufficiency; Chronic radicular cervical pain, left-sided; Chronic fatigue syndrome; Family history of chronic pain; History of knee surgery; History of elbow surgery; Long term current use of opiate analgesic; Long term prescription opiate use; Opioid dependence, daily use (HCC); and Chronic pain on her problem list.. Her primarily concern today is the Neck Pain and Shoulder Pain     The patient comes into the clinic today indicating that recently she went on to get a shirt from her closet in her right shoulder popped. She started having  some acute pain for which she went to the emergency room. There she was given a prescription for oxycodone 10 mg to take every 6 hours #20. Today she comes in telling me that she is going to a trip to [place] and she is leaving early and she would like to have one of her normal prescriptions refilled early. She had it to be refilled on December 19 but she wants to have a refill on the 16th now. She did not bring her prescriptions to the clinic. I will take into consideration the medication that she was given in the emergency room and therefore her next refill should be no sooner than 12/25/2015. I will allow her to have her refill early but it still needs to last until 12/25/2015. Today I talked to the patient about my concerns on her use of medication and she was mad at him for telling her about my concerns.    Today's Pain Score: 8 , clinically she looks like a 2/10. Clear symptom exaggeration. Reported level of pain is incompatible with clinical observations. Pain Type: Chronic pain Pain Location: Neck Pain Descriptors / Indicators: Burning, Stabbing, Aching Pain Frequency: Constant  Date of Last Visit: 09/21/15 Service Provided on Last Visit: Med Refill  Pharmacotherapy Review:   Side-effects or Adverse reactions: None reported Effectiveness: Described as relatively effective, allowing for increase in activities of daily living (ADL) Onset of action: Within expected pharmacological parameters Duration of action: Within normal limits for medication Peak effect: Timing and results are as within normal expected parameters [place] PMP: Compliant with practice rules and regulations Hospital associated UDS Results:  Lab Results  Component Value Date   THCU NEGATIVE 03/27/2013   PCPSCRNUR NEGATIVE 03/27/2013   MDMA NEGATIVE 03/27/2013   AMPHETMU NEGATIVE 03/27/2013   METHADONE NEGATIVE 03/27/2013   UDS Results:   UDS Interpretation: Patient appears to be compliant with practice rules  and  regulations Medication Assessment Form: Reviewed. Patient indicates being compliant with therapy Treatment compliance: Compliant Substance Use Disorder (SUD) Risk Level: Low. However, I am very concerned about her frequent falls, ED visits, and continuing to get pain medications from other physicians. The patient did call to let her son know about having obtained an other prescription from the ED. Pharmacologic Plan: Continue therapy as is  Last Available Lab Work: Admission on 09/27/2015, Discharged on 09/27/2015  Component Date Value Ref Range Status  . Alcohol, Ethyl (B) 09/27/2015 <5  <5 mg/dL Final  Admission on 16/09/9603, Discharged on 09/11/2015  Component Date Value Ref Range Status  . WBC 09/11/2015 11.6* 3.6 - 11.0 K/uL Final  . RBC 09/11/2015 4.04  3.80 - 5.20 MIL/uL Final  . Hemoglobin 09/11/2015 14.1  12.0 - 16.0 g/dL Final  . HCT 54/08/8118 40.8  35.0 - 47.0 % Final  . MCV 09/11/2015 100.9* 80.0 - 100.0 fL Final  . MCH 09/11/2015 34.8* 26.0 - 34.0 pg Final  . MCHC 09/11/2015 34.5  32.0 - 36.0 g/dL Final  . RDW 14/78/2956 14.0  11.5 - 14.5 % Final  . Platelets 09/11/2015 289  150 - 440 K/uL Final  . Neutrophils Relative % 09/11/2015 67   Final  . Neutro Abs 09/11/2015 7.8* 1.4 - 6.5 K/uL Final  . Lymphocytes Relative 09/11/2015 26   Final  . Lymphs Abs 09/11/2015 3.0  1.0 - 3.6 K/uL Final  . Monocytes Relative 09/11/2015 5   Final  . Monocytes Absolute 09/11/2015 0.6  0.2 - 0.9 K/uL Final  . Eosinophils Relative 09/11/2015 1   Final  . Eosinophils Absolute 09/11/2015 0.1  0 - 0.7 K/uL Final  . Basophils Relative 09/11/2015 1   Final  . Basophils Absolute 09/11/2015 0.1  0 - 0.1 K/uL Final  . Sodium 09/11/2015 140  135 - 145 mmol/L Final  . Potassium 09/11/2015 3.9  3.5 - 5.1 mmol/L Final  . Chloride 09/11/2015 109  101 - 111 mmol/L Final  . CO2 09/11/2015 25  22 - 32 mmol/L Final  . Glucose, Bld 09/11/2015 92  65 - 99 mg/dL Final  . BUN 21/30/8657 16  6 - 20 mg/dL  Final  . Creatinine, Ser 09/11/2015 0.83  0.44 - 1.00 mg/dL Final  . Calcium 84/69/6295 8.9  8.9 - 10.3 mg/dL Final  . GFR calc non Af Amer 09/11/2015 >60  >60 mL/min Final  . GFR calc Af Amer 09/11/2015 >60  >60 mL/min Final  . Anion gap 09/11/2015 6  5 - 15 Final   Allergies: Michelle Newton is allergic to cymbalta; flagyl; lyrica; nsaids; and vicodin.  Meds: The patient has a current medication list which includes the following prescription(s): aspirin ec, benzonatate, bisoprolol-hydrochlorothiazide, butalbital-acetaminophen-caffeine, vitamin d, cinnamon, diazepam, estradiol, fluticasone-salmeterol, nitroglycerin, orphenadrine, oxycodone-acetaminophen, paroxetine, probiotic product, promethazine, rosuvastatin, vitamin e, albuterol-ipratropium, and oxycodone-acetaminophen. Requested Prescriptions   Signed Prescriptions Disp Refills  . oxyCODONE-acetaminophen (PERCOCET/ROXICET) 5-325 MG tablet 150 tablet 0    Sig: Take 1 tablet by mouth 5 (five) times daily as needed for moderate pain or severe pain.    ROS: Constitutional: Afebrile, no chills, well hydrated and well nourished Gastrointestinal: negative Musculoskeletal:negative Neurological: negative Behavioral/Psych: negative  PFSH: Medical:  Michelle Newton  has a past medical history of Fibromyalgia; Coronary artery disease; DJD (degenerative joint disease); MI (myocardial infarction) (HCC); Seizures (HCC); Migraine; COPD (chronic obstructive pulmonary disease) (HCC); Asthma; Fibromyalgia; Anxiety; Depression; Hypercholesteremia; Migraine; Vertigo; DJD (degenerative joint disease); Chronic fatigue; Osteoarthritis; CAD (coronary artery  disease); H/O acute myocardial infarction (12/14/2014); Chronic thumb pain, bilateral (09/12/2015); Fracture five ribs-closed (09/12/2015); History of seizures (09/12/2015); History of migraine (09/12/2015); H/O non-insulin dependent diabetes mellitus (09/12/2015); Essential hypertension (09/12/2015); History of  cardiac arrhythmia (09/12/2015); Family history of chronic pain (09/26/2015); History of knee surgery (09/26/2015); and History of elbow surgery (09/26/2015). Family: family history includes Breast cancer in her cousin and maternal aunt. Surgical:  has past surgical history that includes ureter mesh; Lumbar disc surgery; Rotator cuff repair (Right); Knee Arthroplasty (Left); Abdominal hysterectomy; Trigger finger release (Left); and left elbow surgery. Tobacco:  reports that she has been smoking Cigarettes.  She has been smoking about 0.50 packs per day. She has never used smokeless tobacco. Alcohol:  reports that she does not drink alcohol. Drug:  reports that she does not use illicit drugs.  Physical Exam: Vitals:  Today's Vitals   11/13/15 0807  BP: 134/87  Pulse: 79  Temp: 98.6 F (37 C)  TempSrc: Oral  Resp: 14  Height: 4' 11.5" (1.511 m)  Weight: 115 lb (52.164 kg)  SpO2: 98%  PainSc: 8   PainLoc: Neck  Calculated BMI: Body mass index is 22.85 kg/(m^2). General appearance: alert, cooperative, appears stated age and no distress Eyes: PERLA Respiratory: No evidence respiratory distress, no audible rales or ronchi and no use of accessory muscles of respiration Neck: no adenopathy, no carotid bruit, no JVD, supple, symmetrical, trachea midline and thyroid not enlarged, symmetric, no tenderness/mass/nodules  Cervical Spine ROM: Adequate for flexion, extension, rotation, and lateral bending Palpation: No palpable trigger points  Upper Extremities ROM: Decreased range of motion on her right shoulder due to more recent injury. Strength: 5/5 for all flexors and extensors of the upper extremity, bilaterally Pulses: Palpable bilaterally Neurologic: No allodynia, No hyperesthesia, No hyperpathia and No sensory abnormalities  Lumbar Spine ROM: Adequate for flexion, extension, rotation, and lateral bending Palpation: No palpable trigger points Lumbar Hyperextension and rotation:  Non-contributory Patrick's Maneuver: Non-contributory  Lower Extremities ROM: Adequate bilaterally Strength: 5/5 for all flexors and extensors of the lower extremity, bilaterally Pulses: Palpable bilaterally Neurologic: No allodynia, No hyperesthesia, No hyperpathia, No sensory abnormalities and No antalgic gait or posture  Assessment: Encounter Diagnosis:  Primary Diagnosis: Chronic pain [G89.29]  Plan: Interventional Therapies: She is pending to have some injections done into her right shoulder by the orthopedic surgeons. According to her, they wanted to know if she can get better with the injection and if not, they will need to do some surgery. Because they clearly have something in their mind, I have recommended that she have the injection done by them instead of [place].  Michelle Newton was seen today for neck pain and shoulder pain.  Diagnoses and all orders for this visit:  Chronic pain -     C-reactive protein; Future -     Magnesium; Future -     Sedimentation rate; Future  Opiate use  Opioid dependence, daily use (HCC)  Long term prescription opiate use  Long term current use of opiate analgesic -     Drugs of abuse screen w/o alc, rtn urine-sln  Encounter for therapeutic drug level monitoring  Encounter for long-term use of opiate analgesic  Vitamin D insufficiency -     Vitamin D pnl(25-hydrxy+1,25-dihy)-bld; Future  Other orders -     oxyCODONE-acetaminophen (PERCOCET/ROXICET) 5-325 MG tablet; Take 1 tablet by mouth 5 (five) times daily as needed for moderate pain or severe pain.     There are no Patient Instructions on file  for this visit. Medications discontinued today:  Medications Discontinued During This Encounter  Medication Reason  . albuterol (PROVENTIL) (2.5 MG/3ML) 0.083% nebulizer solution Completed Course  . docusate sodium (COLACE) 100 MG capsule Patient Preference  . lactulose (CHRONULAC) 10 GM/15ML solution Patient Preference   Medications  administered today:  Michelle Newton had no medications administered during this visit.  Primary Care Physician: Dortha Kern, MD Location: [facility] Note by: Sydnee Levans Laban Emperor, M.D, DABA, DABAPM, DABPM, DABIPP, FIPP

## 2015-11-13 NOTE — Progress Notes (Signed)
Oxycodone - patient states that she has 8 - 10 pills at home; didn't bring pills with her Patient states that she injured right shoulder last week; went to the ED and then to an orthopedic doctor; was given norflex and has appointment scheduled with orthopedic on 11/28/15 for an injection.

## 2015-11-21 LAB — TOXASSURE SELECT 13 (MW), URINE: PDF: 0

## 2015-12-19 ENCOUNTER — Other Ambulatory Visit: Payer: Self-pay | Admitting: Pain Medicine

## 2015-12-20 ENCOUNTER — Encounter: Payer: Self-pay | Admitting: Pain Medicine

## 2015-12-20 ENCOUNTER — Other Ambulatory Visit: Payer: Self-pay | Admitting: Pain Medicine

## 2015-12-20 ENCOUNTER — Ambulatory Visit: Payer: Medicare Other | Attending: Pain Medicine | Admitting: Pain Medicine

## 2015-12-20 VITALS — BP 110/66 | HR 95 | Temp 98.0°F | Resp 18 | Ht 59.0 in | Wt 115.0 lb

## 2015-12-20 DIAGNOSIS — I252 Old myocardial infarction: Secondary | ICD-10-CM | POA: Diagnosis not present

## 2015-12-20 DIAGNOSIS — G43909 Migraine, unspecified, not intractable, without status migrainosus: Secondary | ICD-10-CM | POA: Diagnosis not present

## 2015-12-20 DIAGNOSIS — Z5181 Encounter for therapeutic drug level monitoring: Secondary | ICD-10-CM | POA: Diagnosis not present

## 2015-12-20 DIAGNOSIS — M25511 Pain in right shoulder: Secondary | ICD-10-CM | POA: Diagnosis not present

## 2015-12-20 DIAGNOSIS — Z79891 Long term (current) use of opiate analgesic: Secondary | ICD-10-CM | POA: Diagnosis not present

## 2015-12-20 DIAGNOSIS — F17210 Nicotine dependence, cigarettes, uncomplicated: Secondary | ICD-10-CM | POA: Insufficient documentation

## 2015-12-20 DIAGNOSIS — F329 Major depressive disorder, single episode, unspecified: Secondary | ICD-10-CM | POA: Insufficient documentation

## 2015-12-20 DIAGNOSIS — E7800 Pure hypercholesterolemia, unspecified: Secondary | ICD-10-CM | POA: Insufficient documentation

## 2015-12-20 DIAGNOSIS — M545 Low back pain: Secondary | ICD-10-CM | POA: Insufficient documentation

## 2015-12-20 DIAGNOSIS — M797 Fibromyalgia: Secondary | ICD-10-CM | POA: Insufficient documentation

## 2015-12-20 DIAGNOSIS — J449 Chronic obstructive pulmonary disease, unspecified: Secondary | ICD-10-CM | POA: Insufficient documentation

## 2015-12-20 DIAGNOSIS — I10 Essential (primary) hypertension: Secondary | ICD-10-CM | POA: Insufficient documentation

## 2015-12-20 DIAGNOSIS — M5412 Radiculopathy, cervical region: Secondary | ICD-10-CM | POA: Diagnosis not present

## 2015-12-20 DIAGNOSIS — M25552 Pain in left hip: Secondary | ICD-10-CM | POA: Insufficient documentation

## 2015-12-20 DIAGNOSIS — F1190 Opioid use, unspecified, uncomplicated: Secondary | ICD-10-CM | POA: Diagnosis not present

## 2015-12-20 DIAGNOSIS — E119 Type 2 diabetes mellitus without complications: Secondary | ICD-10-CM | POA: Diagnosis not present

## 2015-12-20 DIAGNOSIS — M542 Cervicalgia: Secondary | ICD-10-CM | POA: Insufficient documentation

## 2015-12-20 DIAGNOSIS — I2510 Atherosclerotic heart disease of native coronary artery without angina pectoris: Secondary | ICD-10-CM | POA: Insufficient documentation

## 2015-12-20 DIAGNOSIS — G8929 Other chronic pain: Secondary | ICD-10-CM | POA: Insufficient documentation

## 2015-12-20 DIAGNOSIS — E559 Vitamin D deficiency, unspecified: Secondary | ICD-10-CM | POA: Diagnosis not present

## 2015-12-20 DIAGNOSIS — R51 Headache: Secondary | ICD-10-CM | POA: Diagnosis present

## 2015-12-20 DIAGNOSIS — F1120 Opioid dependence, uncomplicated: Secondary | ICD-10-CM | POA: Diagnosis not present

## 2015-12-20 DIAGNOSIS — M25519 Pain in unspecified shoulder: Secondary | ICD-10-CM | POA: Diagnosis present

## 2015-12-20 MED ORDER — OXYCODONE-ACETAMINOPHEN 5-325 MG PO TABS: 1.0000 | ORAL_TABLET | Freq: Every day | ORAL | Status: DC | PRN

## 2015-12-20 MED ORDER — CYCLOBENZAPRINE HCL 10 MG PO TABS: 10.0000 mg | ORAL_TABLET | Freq: Three times a day (TID) | ORAL | Status: DC

## 2015-12-20 NOTE — Progress Notes (Signed)
Having shoulder surgery 2-17.  Surgery pain information sheet given to patient. Safety precautions to be maintained throughout the outpatient stay will include: orient to surroundings, keep bed in low position, maintain call bell within reach at all times, provide assistance with transfer out of bed and ambulation. Pill count # 0

## 2015-12-20 NOTE — Progress Notes (Signed)
Patient's Name: Michelle Newton MRN: [id number] DOB: May 09, 1961 DOS: 12/20/2015  Primary Reason(s) for Visit: Encounter for Medication Management CC: Shoulder Pain; Neck Pain; Headache; and Joint Swelling   HPI:  Michelle Newton is a 55 y.o. year old, female patient, who returns today as an established patient. She has Encounter for therapeutic drug level monitoring; Encounter for long-term use of opiate analgesic; Uncomplicated opioid dependence (HCC); Opiate use; Chronic right shoulder pain; Cervical facet syndrome; Cervical spondylosis; Chronic neck pain; Chronic left hip pain; Lumbar facet syndrome; Lumbar spondylosis; Failed back surgical syndrome; Chronic low back pain; Fibromyalgia; Chronic pain syndrome; Disorder of shoulder; Chronic generalized pain; Moderate COPD (chronic obstructive pulmonary disease) (HCC); Bladder cystocele; Essential (primary) hypertension; H/O acute myocardial infarction; Rotator cuff syndrome; Cervical pain; Chronic thumb pain, bilateral; Upper extremity pain; Dorsalgia of occipito-atlanto-axial region; Occipital neuralgia; Smoker; Generalized anxiety disorder; Depression; Wrist arthritis; Fracture five ribs-closed; Chronic obstructive pulmonary disease (COPD) (HCC); History of seizures; History of migraine; H/O non-insulin dependent diabetes mellitus; Essential hypertension; High cholesterol; Coronary artery disease; History of cardiac arrhythmia; H/O vertigo; Vitamin D insufficiency; Chronic radicular cervical pain, left-sided; Chronic fatigue syndrome; Family history of chronic pain; History of knee surgery; History of elbow surgery; Long term current use of opiate analgesic; Long term prescription opiate use; Opioid dependence, daily use (HCC); Chronic pain; and Joint disorder on her problem list.. Her primarily concern today is the Shoulder Pain; Neck Pain; Headache; and Joint Swelling   The patient returns to the clinic today for pharmacological management of her chronic  pain. She is spending to have surgery in the near future and therefore we will provide her with a handout on postoperative pain management to give to the surgeon. This handout gives very specific instructions to the surgeons on how to handle pain on a chronic pain patient after a surgery. Primarily VAD at this handout is so that the surgeons to not change the medication regimen that were currently given the patient. They should feel free to add any pain medicine that they would normally give to a normal patient for exactly the same amount of time.  Reported Pain Score: 8 , clinically she looks like a 2-3/10. Reported level is inconsistent with clinical obrservations. Pain Type: Chronic pain Pain Location: Neck Pain Descriptors / Indicators: Burning, Aching, Stabbing, Pins and needles Pain Frequency: Constant  Date of Last Visit: 11/13/15 Service Provided on Last Visit: Med Refill  Pharmacotherapy  Review:   Onset of action: Within expected pharmacological parameters Time to Peak effect: Timing and results are as within normal expected parameters Effectiveness: Described as relatively effective, allowing for increase in activities of daily living (ADL) % Relief: More than 50% Side-effects or Adverse reactions: None reported Duration of action: Within normal limits for medication [place] PMP: Compliant with practice rules and regulations UDS Results: Compliant UDS Interpretation: Patient appears to be compliant with practice rules and regulations Medication Assessment Form: Reviewed. Patient indicates being compliant with therapy Treatment compliance: Compliant Substance Use Disorder (SUD) Risk Level: Moderate Pharmacologic Plan: Continue therapy as is  Lab Work: Illicit Drugs Lab Results  Component Value Date   THCU NEGATIVE 03/27/2013   PCPSCRNUR NEGATIVE 03/27/2013   MDMA NEGATIVE 03/27/2013   AMPHETMU NEGATIVE 03/27/2013   METHADONE NEGATIVE 03/27/2013    Inflammation Markers Lab  Results  Component Value Date   ESRSEDRATE 4 05/26/2014    Renal Function Lab Results  Component Value Date   BUN 16 09/11/2015   CREATININE 0.83 09/11/2015   GFRAA >60  09/11/2015   GFRNONAA >60 09/11/2015    Hepatic Function Lab Results  Component Value Date   AST 21 05/26/2014   ALT 20 05/26/2014   ALBUMIN 3.8 05/26/2014    Electrolytes Lab Results  Component Value Date   NA 140 09/11/2015   K 3.9 09/11/2015   CL 109 09/11/2015   CALCIUM 8.9 09/11/2015   MG 1.8 05/26/2014    Allergies:  Michelle Newton is allergic to cymbalta; duloxetine; flagyl; lyrica; vicodin; and nsaids.  Meds:  The patient has a current medication list which includes the following prescription(s): albuterol-ipratropium, aspirin ec, bisoprolol-hydrochlorothiazide, butalbital-acetaminophen-caffeine, vitamin d, cinnamon, cyanocobalamin, cyclobenzaprine, diazepam, dicyclomine, estradiol, fluticasone-salmeterol, ipratropium-albuterol, lisinopril, magnesium, nitroglycerin, oxycodone-acetaminophen, paroxetine, potassium, probiotic product, promethazine, rosuvastatin, vitamin e, oxycodone-acetaminophen, and oxycodone-acetaminophen.  ROS:  Constitutional: Afebrile, no chills, well hydrated and well nourished Gastrointestinal: negative Musculoskeletal:negative Neurological: negative Behavioral/Psych: negative  PFSH:  Medical:  Ms. Farfan  has a past medical history of Fibromyalgia; Coronary artery disease; DJD (degenerative joint disease); MI (myocardial infarction) (HCC); Seizures (HCC); Migraine; COPD (chronic obstructive pulmonary disease) (HCC); Asthma; Fibromyalgia; Anxiety; Depression; Hypercholesteremia; Migraine; Vertigo; DJD (degenerative joint disease); Chronic fatigue; Osteoarthritis; CAD (coronary artery disease); H/O acute myocardial infarction (12/14/2014); Chronic thumb pain, bilateral (09/12/2015); Fracture five ribs-closed (09/12/2015); History of seizures (09/12/2015); History of migraine  (09/12/2015); H/O non-insulin dependent diabetes mellitus (09/12/2015); Essential hypertension (09/12/2015); History of cardiac arrhythmia (09/12/2015); Family history of chronic pain (09/26/2015); History of knee surgery (09/26/2015); and History of elbow surgery (09/26/2015). Family: family history includes Breast cancer in her cousin and maternal aunt. Surgical:  has past surgical history that includes ureter mesh; Lumbar disc surgery; Rotator cuff repair (Right); Knee Arthroplasty (Left); Abdominal hysterectomy; Trigger finger release (Left); and left elbow surgery. Tobacco:  reports that she has been smoking Cigarettes.  She has been smoking about 0.50 packs per day. She has never used smokeless tobacco. Alcohol:  reports that she does not drink alcohol. Drug:  reports that she does not use illicit drugs.  Physical Exam:  Vitals:  Today's Vitals   12/20/15 1444 12/20/15 1446  BP: 110/66   Pulse: 95   Temp: 98 F (36.7 C)   Resp: 18   Height:  (1.499 m)   Weight: 115 lb (52.164 kg)   SpO2: 96%   PainSc: 8  8   PainLoc: Neck     Calculated BMI: Body mass index is 23.21 kg/(m^2).  General appearance: alert, cooperative, appears stated age and mild distress Eyes: PERLA Respiratory: No evidence respiratory distress, no audible rales or ronchi and no use of accessory muscles of respiration  Cervical Spine Inspection: Normal anatomy Alignment: Symetrical Palpation: Tenderness ROM: Decreased  Upper Extremities Inspection: No gross anomalies detected ROM: Decreased, especially in the area of the wrists. Sensory: Dysesthesia Motor: Grossly normal  Thoracic Spine Inspection: No gross anomalies detected Alignment: Symetrical Palpation: WNL ROM: Adequate  Lumbar Spine Inspection: No gross anomalies detected Alignment: Symetrical Palpation: WNL ROM: Decreased Gait: Antalgic (limping)  Lower Extremities Inspection: No gross anomalies detected ROM:  Adequate Sensory: Normal Motor: Grossly normal  Assessment & Plan:  Primary Diagnosis & Pertinent Problem List: The primary encounter diagnosis was Long term current use of opiate analgesic. Diagnoses of Encounter for therapeutic drug level monitoring, Chronic pain, Chronic radicular cervical pain, left-sided, and Fibromyalgia were also pertinent to this visit.  Assessment: No problem-specific assessment & plan notes found for this encounter.   Pharmacotherapy Orders: Meds ordered this encounter  Medications  . cyclobenzaprine (FLEXERIL) 10 MG tablet  Sig: Take 1 tablet (10 mg total) by mouth 3 (three) times daily.    Dispense:  90 tablet    Refill:  2    Do not place this medication, or any other prescription from our practice, on "Automatic Refill". Patient may have prescription filled one day early if pharmacy is closed on scheduled refill date.  Marland Kitchen oxyCODONE-acetaminophen (PERCOCET/ROXICET) 5-325 MG tablet    Sig: Take 1 tablet by mouth 5 (five) times daily as needed for moderate pain or severe pain.    Dispense:  150 tablet    Refill:  0    Do not place this medication, or any other prescription from our practice, on "Automatic Refill". Patient may have prescription filled one day early if pharmacy is closed on scheduled refill date. Do not fill until: 12/25/15 To last until: 01/24/16  . oxyCODONE-acetaminophen (PERCOCET/ROXICET) 5-325 MG tablet    Sig: Take 1 tablet by mouth 5 (five) times daily as needed for moderate pain or severe pain.    Dispense:  150 tablet    Refill:  0    Do not place this medication, or any other prescription from our practice, on "Automatic Refill". Patient may have prescription filled one day early if pharmacy is closed on scheduled refill date. Do not fill until: 01/24/16 To last until: 02/20/16  . oxyCODONE-acetaminophen (PERCOCET/ROXICET) 5-325 MG tablet    Sig: Take 1 tablet by mouth 5 (five) times daily as needed for moderate pain or severe  pain.    Dispense:  150 tablet    Refill:  0    Do not place this medication, or any other prescription from our practice, on "Automatic Refill". Patient may have prescription filled one day early if pharmacy is closed on scheduled refill date. Do not fill until: 02/20/16 To last until: 03/21/16    [facility] & Procedure Orders: No orders of the defined types were placed in this encounter.    Radiology Orders: None  Interventional Therapies: None at this point.    Administered Medications: Ms. Tavano had no medications administered during this visit.  Primary Care Physician: Dortha Kern, MD Location: [facility] Note by: Sydnee Levans Laban Emperor, M.D, DABA, DABAPM, DABPM, DABIPP, FIPP

## 2015-12-20 NOTE — Patient Instructions (Addendum)
Smoking Cessation, Tips for Success If you are ready to quit smoking, congratulations! You have chosen to help yourself be healthier. Cigarettes bring nicotine, tar, carbon monoxide, and other irritants into your body. Your lungs, heart, and blood vessels will be able to work better without these poisons. There are many different ways to quit smoking. Nicotine gum, nicotine patches, a nicotine inhaler, or nicotine nasal spray can help with physical craving. Hypnosis, support groups, and medicines help break the habit of smoking. WHAT THINGS CAN I DO TO MAKE QUITTING EASIER?  Here are some tips to help you quit for good:  Pick a date when you will quit smoking completely. Tell all of your friends and family about your plan to quit on that date.  Do not try to slowly cut down on the number of cigarettes you are smoking. Pick a quit date and quit smoking completely starting on that day.  Throw away all cigarettes.   Clean and remove all ashtrays from your home, work, and car.  On a card, write down your reasons for quitting. Carry the card with you and read it when you get the urge to smoke.  Cleanse your body of nicotine. Drink enough water and fluids to keep your urine clear or pale yellow. Do this after quitting to flush the nicotine from your body.  Learn to predict your moods. Do not let a bad situation be your excuse to have a cigarette. Some situations in your life might tempt you into wanting a cigarette.  Never have "just one" cigarette. It leads to wanting another and another. Remind yourself of your decision to quit.  Change habits associated with smoking. If you smoked while driving or when feeling stressed, try other activities to replace smoking. Stand up when drinking your coffee. Brush your teeth after eating. Sit in a different chair when you read the paper. Avoid alcohol while trying to quit, and try to drink fewer caffeinated beverages. Alcohol and caffeine may urge you to  smoke.  Avoid foods and drinks that can trigger a desire to smoke, such as sugary or spicy foods and alcohol.  Ask people who smoke not to smoke around you.  Have something planned to do right after eating or having a cup of coffee. For example, plan to take a walk or exercise.  Try a relaxation exercise to calm you down and decrease your stress. Remember, you may be tense and nervous for the first 2 weeks after you quit, but this will pass.  Find new activities to keep your hands busy. Play with a pen, coin, or rubber band. Doodle or draw things on paper.  Brush your teeth right after eating. This will help cut down on the craving for the taste of tobacco after meals. You can also try mouthwash.   Use oral substitutes in place of cigarettes. Try using lemon drops, carrots, cinnamon sticks, or chewing gum. Keep them handy so they are available when you have the urge to smoke.  When you have the urge to smoke, try deep breathing.  Designate your home as a nonsmoking area.  If you are a heavy smoker, ask your health care provider about a prescription for nicotine chewing gum. It can ease your withdrawal from nicotine.  Reward yourself. Set aside the cigarette money you save and buy yourself something nice.  Look for support from others. Join a support group or smoking cessation program. Ask someone at home or at work to help you with your plan   to quit smoking.  Always ask yourself, "Do I need this cigarette or is this just a reflex?" Tell yourself, "Today, I choose not to smoke," or "I do not want to smoke." You are reminding yourself of your decision to quit.  Do not replace cigarette smoking with electronic cigarettes (commonly called e-cigarettes). The safety of e-cigarettes is unknown, and some may contain harmful chemicals.  If you relapse, do not give up! Plan ahead and think about what you will do the next time you get the urge to smoke. HOW WILL I FEEL WHEN I QUIT SMOKING? You  may have symptoms of withdrawal because your body is used to nicotine (the addictive substance in cigarettes). You may crave cigarettes, be irritable, feel very hungry, cough often, get headaches, or have difficulty concentrating. The withdrawal symptoms are only temporary. They are strongest when you first quit but will go away within 10-14 days. When withdrawal symptoms occur, stay in control. Think about your reasons for quitting. Remind yourself that these are signs that your body is healing and getting used to being without cigarettes. Remember that withdrawal symptoms are easier to treat than the major diseases that smoking can cause.  Even after the withdrawal is over, expect periodic urges to smoke. However, these cravings are generally short lived and will go away whether you smoke or not. Do not smoke! WHAT RESOURCES ARE AVAILABLE TO HELP ME QUIT SMOKING? Your health care provider can direct you to community resources or hospitals for support, which may include:  Group support.  Education.  Hypnosis.  Therapy.   This information is not intended to replace advice given to you by your health care provider. Make sure you discuss any questions you have with your health care provider.   Document Released: 08/16/2004 Document Revised: 12/09/2014 Document Reviewed: 05/06/2013 Elsevier Interactive Patient Education 2016 [employer]. Post operative pain management handout given

## 2015-12-22 ENCOUNTER — Emergency Department
Admission: EM | Admit: 2015-12-22 | Discharge: 2015-12-22 | Disposition: A | Discharge status: Home / Self Care | Payer: Medicare Other | Attending: Emergency Medicine | Admitting: Emergency Medicine

## 2015-12-22 ENCOUNTER — Other Ambulatory Visit: Payer: Self-pay

## 2015-12-22 ENCOUNTER — Encounter: Payer: Self-pay | Admitting: Emergency Medicine

## 2015-12-22 ENCOUNTER — Emergency Department: Payer: Medicare Other

## 2015-12-22 DIAGNOSIS — Z79899 Other long term (current) drug therapy: Secondary | ICD-10-CM | POA: Insufficient documentation

## 2015-12-22 DIAGNOSIS — I10 Essential (primary) hypertension: Secondary | ICD-10-CM | POA: Insufficient documentation

## 2015-12-22 DIAGNOSIS — F17210 Nicotine dependence, cigarettes, uncomplicated: Secondary | ICD-10-CM | POA: Insufficient documentation

## 2015-12-22 DIAGNOSIS — R079 Chest pain, unspecified: Secondary | ICD-10-CM | POA: Diagnosis not present

## 2015-12-22 DIAGNOSIS — Z7982 Long term (current) use of aspirin: Secondary | ICD-10-CM | POA: Insufficient documentation

## 2015-12-22 DIAGNOSIS — Z7951 Long term (current) use of inhaled steroids: Secondary | ICD-10-CM | POA: Diagnosis not present

## 2015-12-22 DIAGNOSIS — E119 Type 2 diabetes mellitus without complications: Secondary | ICD-10-CM | POA: Insufficient documentation

## 2015-12-22 DIAGNOSIS — I252 Old myocardial infarction: Secondary | ICD-10-CM | POA: Diagnosis not present

## 2015-12-22 DIAGNOSIS — I2510 Atherosclerotic heart disease of native coronary artery without angina pectoris: Secondary | ICD-10-CM | POA: Insufficient documentation

## 2015-12-22 DIAGNOSIS — R110 Nausea: Secondary | ICD-10-CM | POA: Diagnosis not present

## 2015-12-22 DIAGNOSIS — R05 Cough: Secondary | ICD-10-CM | POA: Diagnosis not present

## 2015-12-22 LAB — BASIC METABOLIC PANEL
ANION GAP: 8 (ref 5–15)
BUN: 11 mg/dL (ref 6–20)
CHLORIDE: 104 mmol/L (ref 101–111)
CO2: 26 mmol/L (ref 22–32)
Calcium: 8.7 mg/dL — ABNORMAL LOW (ref 8.9–10.3)
Creatinine, Ser: 0.69 mg/dL (ref 0.44–1.00)
Glucose, Bld: 86 mg/dL (ref 65–99)
POTASSIUM: 3.8 mmol/L (ref 3.5–5.1)
SODIUM: 138 mmol/L (ref 135–145)

## 2015-12-22 LAB — CBC
HEMATOCRIT: 44.2 % (ref 35.0–47.0)
HEMOGLOBIN: 14.8 g/dL (ref 12.0–16.0)
MCH: 33.7 pg (ref 26.0–34.0)
MCHC: 33.4 g/dL (ref 32.0–36.0)
MCV: 100.9 fL — AB (ref 80.0–100.0)
Platelets: 309 10*3/uL (ref 150–440)
RBC: 4.38 MIL/uL (ref 3.80–5.20)
RDW: 14 % (ref 11.5–14.5)
WBC: 9.3 10*3/uL (ref 3.6–11.0)

## 2015-12-22 LAB — TROPONIN I: Troponin I: <0.03 ng/mL (ref <0.031)

## 2015-12-22 NOTE — ED Notes (Signed)
States she developed pain to mid chest this am around 6 am   Positive nausea

## 2015-12-22 NOTE — ED Provider Notes (Signed)
[facility] Emergency Department Provider Note  ____________________________________________  Time seen: Approximately 1:00 PM  I have reviewed the triage vital signs and the nursing notes.   HISTORY  Chief Complaint Chest Pain    HPI Michelle Newton is a 55 y.o. female who complains of chest pain on awakening at 6:00 this morning. The pain feels tight last less than a minute and goes away and comes back before minutes out. Patient had an MI in the past but this does not feel like her pain that she remembers from the [facility]. Patient had some nausea with it but no unusual sweating or shortness of breath. Patient's had these symptoms once before and was diagnosed as exacerbation of her fibromyalgia. Patient has had a URI and has developed a cough. She is taking Delsym cough medicine and was wondering if this could make the symptoms she is having. Nothing seems to make the pain better or worse.  Past Medical History  Diagnosis Date  . Fibromyalgia   . Coronary artery disease   . DJD (degenerative joint disease)   . MI (myocardial infarction) (HCC)   . Seizures (HCC)   . Migraine   . COPD (chronic obstructive pulmonary disease) (HCC)   . Asthma   . Fibromyalgia   . Anxiety   . Depression   . Hypercholesteremia   . Migraine   . Vertigo   . DJD (degenerative joint disease)   . Chronic fatigue   . Osteoarthritis   . CAD (coronary artery disease)   . H/O acute myocardial infarction 12/14/2014  . Chronic thumb pain, bilateral 09/12/2015  . Fracture five ribs-closed 09/12/2015  . History of seizures 09/12/2015  . History of migraine 09/12/2015  . H/O non-insulin dependent diabetes mellitus 09/12/2015  . Essential hypertension 09/12/2015  . History of cardiac arrhythmia 09/12/2015  . Family history of chronic pain 09/26/2015  . History of knee surgery 09/26/2015  . History of elbow surgery 09/26/2015    Left ulnar nerve transposition    Patient Active  Problem List   Diagnosis Date Noted  . Long term current use of opiate analgesic 11/13/2015  . Long term prescription opiate use 11/13/2015  . Opioid dependence, daily use (HCC) 11/13/2015  . Chronic pain 11/13/2015  . Chronic radicular cervical pain, left-sided 09/26/2015  . Chronic fatigue syndrome 09/26/2015  . Family history of chronic pain 09/26/2015  . History of knee surgery 09/26/2015  . History of elbow surgery 09/26/2015  . Vitamin D insufficiency 09/21/2015  . Encounter for therapeutic drug level monitoring 09/12/2015  . Encounter for long-term use of opiate analgesic 09/12/2015  . Uncomplicated opioid dependence (HCC) 09/12/2015  . Opiate use 09/12/2015  . Chronic right shoulder pain 09/12/2015  . Cervical facet syndrome 09/12/2015  . Cervical spondylosis 09/12/2015  . Chronic neck pain 09/12/2015  . Chronic left hip pain 09/12/2015  . Lumbar facet syndrome 09/12/2015  . Lumbar spondylosis 09/12/2015  . 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 Chronic low back pain 09/12/2015  . Fibromyalgia 09/12/2015  . Chronic pain syndrome 09/12/2015  . Chronic thumb pain, bilateral 09/12/2015  . Upper extremity pain 09/12/2015  . Dorsalgia of occipito-atlanto-axial region 09/12/2015  . Occipital neuralgia 09/12/2015  . Smoker 09/12/2015  . Generalized anxiety disorder 09/12/2015  . Depression 09/12/2015  . Wrist arthritis 09/12/2015  . Fracture five ribs-closed 09/12/2015  . Chronic obstructive pulmonary disease (COPD) (HCC) 09/12/2015  . History of seizures 09/12/2015  . History of migraine 09/12/2015  .  H/O non-insulin dependent diabetes mellitus 09/12/2015  . Essential hypertension 09/12/2015  . High cholesterol 09/12/2015  . Coronary artery disease 09/12/2015  . History of cardiac arrhythmia 09/12/2015  . H/O vertigo 09/12/2015  . Cervical pain 07/19/2015  . Disorder of shoulder 07/10/2015  . Joint disorder 07/10/2015  . Chronic generalized pain 12/14/2014  .  Moderate COPD (chronic obstructive pulmonary disease) (HCC) 12/14/2014  . Essential (primary) hypertension 12/14/2014  . H/O acute myocardial infarction 12/14/2014  . Rotator cuff syndrome 11/15/2014  . Bladder cystocele 07/29/2014    Past Surgical History  Procedure Laterality Date  . Ureter mesh    . Lumbar disc surgery    . Rotator cuff repair Right   . Knee arthroplasty Left   . Abdominal hysterectomy    . Trigger finger release Left   . Left elbow surgery      Current Outpatient Rx  Name  Route  Sig  Dispense  Refill  . albuterol-ipratropium (COMBIVENT) 18-103 MCG/ACT inhaler   Inhalation   Inhale 2 puffs into the lungs every 6 (six) hours as needed for wheezing or shortness of breath.         Marland Kitchen aspirin EC 81 MG tablet   Oral   Take 81 mg by mouth daily.         . bisoprolol-hydrochlorothiazide (ZIAC) 10-6.25 MG tablet   Oral   Take 1 tablet by mouth daily.         . butalbital-acetaminophen-caffeine (FIORICET, ESGIC) 50-325-40 MG tablet   Oral   Take 1 tablet by mouth every 4 (four) hours as needed for headache.         . Cholecalciferol (VITAMIN D) 2000 UNITS tablet   Oral   Take 1 tablet (2,000 Units total) by mouth daily.   30 tablet   PRN     Do not place this medication, or any other prescri ...   . Cinnamon ([facility] CINNAMON) 500 MG capsule   Oral   Take 500 mg by mouth daily.         . cyanocobalamin (,VITAMIN B-12,) 1000 MCG/ML injection      Every 3 weeks         . cyclobenzaprine (FLEXERIL) 10 MG tablet   Oral   Take 1 tablet (10 mg total) by mouth 3 (three) times daily.   90 tablet   2     Do not place this medication, or any other prescri ...   . diazepam (VALIUM) 5 MG tablet   Oral   Take 5 mg by mouth every 8 (eight) hours as needed for anxiety.         . dicyclomine (BENTYL) 10 MG capsule      take 1 capsule by mouth four times a day if needed for ABDOMINAL SPASM      0   . estradiol (CLIMARA - DOSED IN MG/24 HR) 0.1  mg/24hr patch   Transdermal   Place 0.1 mg onto the skin once a week. On Wednesday         . Fluticasone-Salmeterol (ADVAIR) 100-50 MCG/DOSE AEPB   Inhalation   Inhale 1 puff into the lungs 2 (two) times daily.         . Ipratropium-Albuterol (COMBIVENT) 20-100 MCG/ACT AERS respimat   Inhalation   Inhale into the lungs.         Marland Kitchen lisinopril (PRINIVIL,ZESTRIL) 5 MG tablet   Oral   Take 5 mg by mouth daily.      0   .  Magnesium 400 MG TABS   Oral   Take 1 tablet by mouth daily.         . nitroGLYCERIN (NITROSTAT) 0.4 MG SL tablet   Sublingual   Place 0.4 mg under the tongue every 5 (five) minutes as needed for chest pain.         Marland Kitchen oxyCODONE-acetaminophen (PERCOCET/ROXICET) 5-325 MG tablet   Oral   Take 1 tablet by mouth 5 (five) times daily as needed for moderate pain or severe pain.   150 tablet   0     Do not place this medication, or any other prescri ...   . oxyCODONE-acetaminophen (PERCOCET/ROXICET) 5-325 MG tablet   Oral   Take 1 tablet by mouth 5 (five) times daily as needed for moderate pain or severe pain.   150 tablet   0     Do not place this medication, or any other prescri ...   . oxyCODONE-acetaminophen (PERCOCET/ROXICET) 5-325 MG tablet   Oral   Take 1 tablet by mouth 5 (five) times daily as needed for moderate pain or severe pain.   150 tablet   0     Do not place this medication, or any other prescri ...   . PARoxetine (PAXIL) 30 MG tablet   Oral   Take 30 mg by mouth daily.         . Potassium 95 MG TABS   Oral   Take 1 tablet by mouth daily.         . Probiotic Product (PROBIOTIC ADVANCED PO)   Oral   Take 2 tablets by mouth daily.         . promethazine (PHENERGAN) 25 MG tablet   Oral   Take 25 mg by mouth every 6 (six) hours as needed for nausea or vomiting.         . rosuvastatin (CRESTOR) 20 MG tablet   Oral   Take 20 mg by mouth at bedtime.          . vitamin E 1000 UNIT capsule   Oral   Take 1,000  Units by mouth daily.           Allergies Cymbalta; Duloxetine; Flagyl; Lyrica; Vicodin; and Nsaids  Family History  Problem Relation Age of Onset  . Breast cancer Maternal Aunt   . Breast cancer Cousin     Social History Social History  Substance Use Topics  . Smoking status: Current Every Day Smoker -- 0.50 packs/day    Types: Cigarettes  . Smokeless tobacco: Never Used  . Alcohol Use: No    Review of Systems Constitutional: No fever/chills Eyes: No visual changes. ENT: No sore throat. Cardiovascular see history of present illness Respiratory: Denies shortness of breath. Gastrointestinal: No abdominal pain.  No nausea, no vomiting.  No diarrhea.  No constipation. Genitourinary: Negative for dysuria. Musculoskeletal: Negative for back pain. Skin: Negative for rash. Neurological: Negative for headaches, focal weakness or numbness.  10-point ROS otherwise negative.  ____________________________________________   PHYSICAL EXAM:  VITAL SIGNS: ED Triage Vitals  Enc Vitals Group     BP 12/22/15 1045 128/70 mmHg     Pulse Rate 12/22/15 1045 67     Resp 12/22/15 1045 20     Temp 12/22/15 1045 98.1 F (36.7 C)     Temp Source 12/22/15 1045 Oral     SpO2 12/22/15 1045 96 %     Weight 12/22/15 1045 115 lb (52.164 kg)     Height 12/22/15 1045  4' 11.5" (1.511 m)     Head Cir --      Peak Flow --      Pain Score 12/22/15 1043 7     Pain Loc --      Pain Edu? --      Excl. in GC? --     Constitutional: Alert and oriented. Well appearing and in no acute distress. Eyes: Conjunctivae are normal. PERRL. EOMI. Head: Atraumatic. Nose: No congestion/rhinnorhea. Mouth/Throat: Mucous membranes are moist.  Oropharynx non-erythematous. Neck: No stridor. Cardiovascular: Normal rate, regular rhythm. Grossly normal heart sounds.  Good peripheral circulation. Respiratory: Normal respiratory effort.  No retractions. Lungs CTAB. Gastrointestinal: Soft and nontender. No  distention. No abdominal bruits. No CVA tenderness. Musculoskeletal: No lower extremity tenderness nor edema.  No joint effusions. Neurologic:  Normal speech and language. No gross focal neurologic deficits are appreciated. No gait instability. Skin:  Skin is warm, dry and intact. No rash noted.   ____________________________________________   LABS (all labs ordered are listed, but only abnormal results are displayed)  Labs Reviewed  BASIC METABOLIC PANEL - Abnormal; Notable for the following:    Calcium 8.7 (*)    All other components within normal limits  CBC - Abnormal; Notable for the following:    MCV 100.9 (*)    All other components within normal limits  TROPONIN I   ____________________________________________  EKG  EKG read and interpreted by me shows normal sinus rhythm rate of 63 normal axis computer is reading possible left atrial enlargement EKG is otherwise normal ____________________________________________  RADIOLOGY  Chest x-ray read by radiology reviewed by me  shows no acute disease ____________________________________________   PROCEDURES  I told patient was given a do a second troponin just to be sure patient wants to go home now since first troponin was negative. Since first troponin was done several hours after her chest pain in her chest pain is intermittent and unchanged within normal percent and normal EKG I will allow her to leave if she wishes.  ____________________________________________   INITIAL IMPRESSION / ASSESSMENT AND PLAN / ED COURSE  Pertinent labs & imaging results that were available during my care of the patient were reviewed by me and considered in my medical decision making (see chart for details). ____________________________________________   FINAL CLINICAL IMPRESSION(S) / ED DIAGNOSES  Final diagnoses:  Chest pain, unspecified chest pain type      Arnaldo Natal, MD 12/22/15 2154

## 2015-12-22 NOTE — Telephone Encounter (Signed)
Request for e-script, phone, and/or fax refilled denied, based on my personal practicing policy of no e-script, phone, and/or fax refills outside of appointments. 

## 2015-12-26 LAB — TOXASSURE SELECT 13 (MW), URINE: PDF: 0

## 2016-01-12 NOTE — Progress Notes (Signed)
Quick Note:  NOTE: This forensic urine drug screen (UDS) test was conducted using a state-of-the-art ultra high performance liquid chromatography and mass spectrometry system (UPLC/MS-MS), the most sophisticated and accurate method available. UPLC/MS-MS is 1,000 times more precise and accurate than standard gas chromatography and mass spectrometry (GC/MS). This system can analyze 26 drug categories and 180 drug compounds.  The results of this test came back with unexpected findings: 1. No Oxycodone (None of the prescribed medication. 2. Unreported Benzodiazepine.  The 2016 CDC Guideline Recommendations state: "Clinicians should avoid prescribing opioid pain medication and benzodiazepines concurrently whenever possible (recommendation category: A, evidence type: 3)" - Recommendations and Reports, [place] Department of Health and Human Services/Centers for Disease Control and Prevention 32 MMWR / February 17, 2015 / Vol. 65 / No. 1 / Page 31-2 / item 11.  Benzodiazepines and opioids both cause central nervous system depression and can decrease respiratory drive. Concurrent use is likely to put patients at greater risk for potentially fatal overdose.  The contextual evidence review found evidence in epidemiologic series of concurrent benzodiazepine use in large proportions of opioid-related overdose deaths, and a case-cohort study found concurrent benzodiazepine prescription with opioid prescription to be associated with a near quadrupling of risk for overdose death compared with opioid prescription alone.  A commonly used tapering schedule that has been used safely and with moderate success is a reduction of the benzodiazepine dose by 25% every 1-2 weeks.  Examples of oral benzodiazepines are: alprazolam (Xanax, Xanax XR) clobazam (Onfi) clonazepam (Klonopin) clorazepate (Tranxene, Tranxene SD) chlordiazepoxide (Librium) diazepam (Valium, Diastat Acudial, Diastat) estazolam (Prosom is a discontinued  brand in the [place]) lorazepam (Ativan) oxazepam (Serax is a discontinued brand in the [place]) temazepam (Restoril) triazolam (Halcion) ______

## 2016-02-22 DIAGNOSIS — M67919 Unspecified disorder of synovium and tendon, unspecified shoulder: Secondary | ICD-10-CM | POA: Insufficient documentation

## 2016-03-18 ENCOUNTER — Encounter: Payer: Medicare Other | Admitting: Pain Medicine

## 2016-03-19 ENCOUNTER — Encounter: Payer: Self-pay | Admitting: Pain Medicine

## 2016-03-19 ENCOUNTER — Ambulatory Visit (HOSPITAL_BASED_OUTPATIENT_CLINIC_OR_DEPARTMENT_OTHER): Payer: Medicare Other | Admitting: Pain Medicine

## 2016-03-19 ENCOUNTER — Other Ambulatory Visit
Admission: RE | Admit: 2016-03-19 | Discharge: 2016-03-19 | Disposition: A | Discharge status: Home / Self Care | Payer: Medicare Other | Source: Ambulatory Visit | Attending: Pain Medicine | Admitting: Pain Medicine

## 2016-03-19 VITALS — BP 108/81 | HR 94 | Temp 97.8°F | Resp 18 | Ht 59.0 in | Wt 115.0 lb

## 2016-03-19 DIAGNOSIS — Z5181 Encounter for therapeutic drug level monitoring: Secondary | ICD-10-CM | POA: Diagnosis not present

## 2016-03-19 DIAGNOSIS — Z79891 Long term (current) use of opiate analgesic: Secondary | ICD-10-CM | POA: Diagnosis not present

## 2016-03-19 DIAGNOSIS — G90511 Complex regional pain syndrome I of right upper limb: Secondary | ICD-10-CM | POA: Insufficient documentation

## 2016-03-19 DIAGNOSIS — G8929 Other chronic pain: Secondary | ICD-10-CM | POA: Insufficient documentation

## 2016-03-19 DIAGNOSIS — F1190 Opioid use, unspecified, uncomplicated: Secondary | ICD-10-CM

## 2016-03-19 DIAGNOSIS — M797 Fibromyalgia: Secondary | ICD-10-CM | POA: Diagnosis present

## 2016-03-19 MED ORDER — CYCLOBENZAPRINE HCL 10 MG PO TABS: 10.0000 mg | ORAL_TABLET | Freq: Three times a day (TID) | ORAL | Status: DC

## 2016-03-19 MED ORDER — OXYCODONE-ACETAMINOPHEN 5-325 MG PO TABS: 1.0000 | ORAL_TABLET | Freq: Every day | ORAL | Status: DC | PRN

## 2016-03-19 NOTE — Progress Notes (Signed)
Patient's Name: Michelle Newton  Patient type: Established  MRN: [id number]  Service setting: Ambulatory outpatient  DOB: Aug 24, 1961  Location: [facility]  DOS: 03/19/2016  Primary Care Physician: Dortha Kern, MD  Note by: Sydnee Levans Laban Emperor, M.D, DABA, DABAPM, DABPM, Olga Coaster, FIPP  Referring Physician: Dortha Kern, MD  Specialty: Board-Certified Interventional Pain Management     Primary Reason(s) for Visit: Encounter for prescription drug management (Level of risk: moderate) CC: Fibromyalgia   HPI  Michelle Newton is a 55 y.o. year old, female patient, who returns today as an established patient. She has Encounter for therapeutic drug level monitoring; Encounter for long-term use of opiate analgesic; Uncomplicated opioid dependence (HCC); Opiate use (37.5 MME/Day); Chronic right shoulder pain; Cervical facet syndrome; Cervical spondylosis; Chronic neck pain; Chronic left hip pain; Lumbar facet syndrome; Lumbar spondylosis; Failed back surgical syndrome; Chronic low back pain; Fibromyalgia; Chronic pain syndrome; Disorder of shoulder; Chronic generalized pain; Moderate COPD (chronic obstructive pulmonary disease) (HCC); Bladder cystocele; Essential (primary) hypertension; H/O acute myocardial infarction; Rotator cuff syndrome; Cervical pain; Chronic thumb pain, bilateral; Upper extremity pain; Dorsalgia of occipito-atlanto-axial region; Occipital neuralgia; Smoker; Generalized anxiety disorder; Depression; Wrist arthritis; Fracture five ribs-closed; Chronic obstructive pulmonary disease (COPD) (HCC); History of seizures; History of migraine; H/O non-insulin dependent diabetes mellitus; Essential hypertension; High cholesterol; Coronary artery disease; History of cardiac arrhythmia; H/O vertigo; Vitamin D insufficiency; Chronic radicular cervical pain, left-sided; Chronic fatigue syndrome; Family history of chronic pain; History of knee surgery; History of elbow surgery;  Long term current use of opiate analgesic; Long term prescription opiate use; Opioid dependence, daily use (HCC); Chronic pain; Joint disorder; Disorder of rotator cuff; and CRPS (complex regional pain syndrome), type I, upper (Right) on her problem list.. Her primarily concern today is the Fibromyalgia   Pain Assessment: Self-Reported Pain Score: 8 , clinically she looks like a 2/10. Reported level is inconsistent with clinical obrservations Pain Orientation: Other (Comment) ("Pain is all over") Pain Descriptors / Indicators: Throbbing, Sharp Pain Frequency: Constant  The patient returns to the clinics today indicating having had surgery on 01/19/2016. According to the reported patient had arthroscopy of the right shoulder with distal claviculectomy, including distal articular surface ([facility] Procedure). She returns to the clinics today indicating that she was told yesterday that perhaps we could increase her oxycodone further to compensate for her current pain. She brought a letter indicating that they are concerned about the possibility of a complex regional pain syndrome. The patient does have swelling of the extremity, although not significant with no significant: Her temperature changes. However because there is the possibility that he may be a possibility if CRPS, we will bring the patient back for a diagnostic stellate ganglion block. I have informed the patient that I will not be increasing her narcotics since opioids are not the best choices for neuropathic pain. Unfortunately, the patient indicates that she cannot tolerate any Lyrica or Neurontin, both of which are the best possible alternatives for the treatment of a neuropathic type of pain.  Date of Last Visit: 12/20/15 Service Provided on Last Visit: Med Refill  Controlled Substance Pharmacotherapy Assessment & REMS (Risk Evaluation and Mitigation Strategy)  Analgesic: Oxycodone/APAP 5/325 one tablet 5 times a day (25 mg/day) Pill  Count: The patient did not bring any of her prescriptions, pills, or empty bottles. The patient was given a final warning with regards to this. Today she also had some excuses when I asked her for a urine sample  for the UDS. Went ahead and put an order for a serum drug screen, but for some reason they were unable to see my ordered down in the lab. The patient came back and attempted to give us a urine sample which I believe may be very limited. MME/day: 37.5 mg/day.  Pharmacokinetics: Onset of action (Liberation/Absorption): Within expected pharmacological parameters Time to Peak effect (Distribution): Timing and results are as within normal expected parameters Duration of action (Metabolism/Excretion): Within normal limits for medication Pharmacodynamics: Analgesic Effect: More than 50% Activity Facilitation: Medication(s) allow patient to sit, stand, walk, and do the basic ADLs Perceived Effectiveness: Described as relatively effective, allowing for increase in activities of daily living (ADL) Side-effects or Adverse reactions: None reported Monitoring: [place] PMP: Online review of the past 7236-month period conducted. Compliant with practice rules and regulations UDS Results/interpretation: The patient's last UDS was done on 12/20/2015 and it came back abnormal with no oxycodone present and evidence of unreported benzodiazepines. Medication Assessment Form: Reviewed. Patient indicates being compliant with therapy Treatment compliance: Compliant Risk Assessment: Aberrant Behavior: claims that "nothing else works", extensive time discussing medication , repeated negotiations to obtain more medication and frequent involvement in accidents Substance Use Disorder (SUD) Risk Level: Moderate Risk of opioid abuse or dependence: 0.7-3.0% with doses ? 36 MME/day and 6.1-26% with doses ? 120 MME/day. Opioid Risk Tool (ORT) Score: Total Score: 5 Moderate Risk for SUD (Score between 4-7) Depression Scale  Score: PHQ-2: PHQ-2 Total Score: 0 No depression (0) PHQ-9: PHQ-9 Total Score: 0 No depression (0-4)  Pharmacologic Plan: No change in therapy, at this time  Laboratory Chemistry  Inflammation Markers Lab Results  Component Value Date   ESRSEDRATE 4 05/26/2014    Renal Function Lab Results  Component Value Date   BUN 11 12/22/2015   CREATININE 0.69 12/22/2015   GFRAA >60 12/22/2015   GFRNONAA >60 12/22/2015    Hepatic Function Lab Results  Component Value Date   AST 21 05/26/2014   ALT 20 05/26/2014   ALBUMIN 3.8 05/26/2014    Electrolytes Lab Results  Component Value Date   NA 138 12/22/2015   K 3.8 12/22/2015   CL 104 12/22/2015   CALCIUM 8.7* 12/22/2015   MG 1.8 05/26/2014    Pain Modulating Vitamins No results found for: VD25OH, VD125OH2TOT, [id number], NW2956OZ3VD2125OH2, VITAMINB12  Coagulation Parameters No results found for: INR, LABPROT  Note: I personally reviewed the above data. Results shared with patient.  Meds  The patient has a current medication list which includes the following prescription(s): albuterol-ipratropium, aspirin ec, bisoprolol-hydrochlorothiazide, butalbital-acetaminophen-caffeine, vitamin d, cinnamon, cyanocobalamin, cyclobenzaprine, diazepam, dicyclomine, estradiol, fluticasone-salmeterol, ipratropium-albuterol, lisinopril, magnesium, nitroglycerin, oxycodone-acetaminophen, oxycodone-acetaminophen, oxycodone-acetaminophen, paroxetine, potassium, probiotic product, promethazine, rosuvastatin, and vitamin e.  Current Outpatient Prescriptions on File Prior to Visit  Medication Sig  . albuterol-ipratropium (COMBIVENT) 18-103 MCG/ACT inhaler Inhale 2 puffs into the lungs every 6 (six) hours as needed for wheezing or shortness of breath.  Marland Kitchen. aspirin EC 81 MG tablet Take 81 mg by mouth daily.  . bisoprolol-hydrochlorothiazide (ZIAC) 10-6.25 MG tablet Take 1 tablet by mouth daily.  . butalbital-acetaminophen-caffeine (FIORICET, ESGIC) 50-325-40 MG  tablet Take 1 tablet by mouth every 4 (four) hours as needed for headache.  . Cholecalciferol (VITAMIN D) 2000 UNITS tablet Take 1 tablet (2,000 Units total) by mouth daily.  Marland Kitchen. Cinnamon ([employer] CINNAMON) 500 MG capsule Take 500 mg by mouth daily.  . cyanocobalamin (,VITAMIN B-12,) 1000 MCG/ML injection Every 3 weeks  . diazepam (VALIUM) 5 MG tablet Take  5 mg by mouth every 8 (eight) hours as needed for anxiety.  . dicyclomine (BENTYL) 10 MG capsule take 1 capsule by mouth four times a day if needed for ABDOMINAL SPASM  . estradiol (CLIMARA - DOSED IN MG/24 HR) 0.1 mg/24hr patch Place 0.1 mg onto the skin once a week. On Wednesday  . Fluticasone-Salmeterol (ADVAIR) 100-50 MCG/DOSE AEPB Inhale 1 puff into the lungs 2 (two) times daily.  . Ipratropium-Albuterol (COMBIVENT) 20-100 MCG/ACT AERS respimat Inhale into the lungs.  Marland Kitchen lisinopril (PRINIVIL,ZESTRIL) 5 MG tablet Take 5 mg by mouth daily.  . Magnesium 400 MG TABS Take 1 tablet by mouth daily.  . nitroGLYCERIN (NITROSTAT) 0.4 MG SL tablet Place 0.4 mg under the tongue every 5 (five) minutes as needed for chest pain.  Marland Kitchen PARoxetine (PAXIL) 30 MG tablet Take 30 mg by mouth daily.  . Potassium 95 MG TABS Take 1 tablet by mouth daily.  . Probiotic Product (PROBIOTIC ADVANCED PO) Take 2 tablets by mouth daily.  . promethazine (PHENERGAN) 25 MG tablet Take 25 mg by mouth every 6 (six) hours as needed for nausea or vomiting.  . rosuvastatin (CRESTOR) 20 MG tablet Take 20 mg by mouth at bedtime.   . vitamin E 1000 UNIT capsule Take 1,000 Units by mouth daily.   No current facility-administered medications on file prior to visit.    ROS  Constitutional: Afebrile, no chills, well hydrated and well nourished Gastrointestinal: No upper or lower GI bleeding, no nausea, no vomiting and no acute GI distress Musculoskeletal: No acute joint swelling or redness, no acute loss of range of motion and no acute onset weakness Neurological: Denies any acute onset  apraxia, no episodes of paralysis, no acute loss of coordination, no acute loss of consciousness and no acute onset aphasia, dysarthria, agnosia, or amnesia  Allergies  Ms. Juncaj is allergic to cymbalta; duloxetine; flagyl; lyrica; vicodin; and nsaids.  PFSH  Medical:  Ms. Holsonback  has a past medical history of Fibromyalgia; Coronary artery disease; DJD (degenerative joint disease); MI (myocardial infarction) (HCC); Seizures (HCC); Migraine; COPD (chronic obstructive pulmonary disease) (HCC); Asthma; Fibromyalgia; Anxiety; Depression; Hypercholesteremia; Migraine; Vertigo; DJD (degenerative joint disease); Chronic fatigue; Osteoarthritis; CAD (coronary artery disease); H/O acute myocardial infarction (12/14/2014); Chronic thumb pain, bilateral (09/12/2015); Fracture five ribs-closed (09/12/2015); History of seizures (09/12/2015); History of migraine (09/12/2015); H/O non-insulin dependent diabetes mellitus (09/12/2015); Essential hypertension (09/12/2015); History of cardiac arrhythmia (09/12/2015); Family history of chronic pain (09/26/2015); History of knee surgery (09/26/2015); and History of elbow surgery (09/26/2015). Family: family history includes Breast cancer in her cousin and maternal aunt. Surgical:  has past surgical history that includes ureter mesh; Lumbar disc surgery; Rotator cuff repair (Right); Knee Arthroplasty (Left); Abdominal hysterectomy; Trigger finger release (Left); and left elbow surgery. Tobacco:  reports that she has been smoking Cigarettes.  She has been smoking about 0.50 packs per day. She has never used smokeless tobacco. Alcohol:  reports that she does not drink alcohol. Drug:  reports that she does not use illicit drugs.  Physical Examination  Constitutional Vitals:  Today's Vitals   03/19/16 1321 03/19/16 1332  BP: 108/81   Pulse: 94   Temp: 97.8 F (36.6 C)   TempSrc: Oral   Resp: 18   Height: 4\' 11"  (1.499 m)   Weight: 115 lb (52.164 kg)   SpO2: 98%    PainSc:  8    Calculated BMI: Body mass index is 23.21 kg/(m^2). (18.5-24.9 kg/m2) Ideal body weight General appearance: alert, cooperative, in  mild distress, well nourished and well hydrated Eyes: PERLA Respiratory: No evidence respiratory distress, no audible rales or ronchi and no use of accessory muscles of respiration Psych: Alert, oriented to person, oriented to place and oriented to time  Cervical Spine Exam  Inspection: Normal anatomy, no anomalies observed Cervical Lordosis: Normal Alignment: Symetrical Functional ROM: Within functional limits [facility]) AROM: WFL Sensory: No sensory anomalies reported or detected  Upper Extremity Exam    Right  Left  Inspection: Some swelling of the right upper extremity.  Inspection: No gross anomalies detected  Functional ROM: Limited  Functional ROM: Adequate  AROM: Decreased at the shoulder   AROM: Adequate  Sensory: Movement-associated discomfort  Sensory: No sensory anomalies reported or detected  Motor: Guarding  Motor: Unremarkable  Vascular: Some swelling observed.  Vascular: Normal skin color, temperature, and hair growth. No peripheral edema or cyanosis   Thoracic Spine  Inspection: No gross anomalies detected Alignment: Symetrical Functional ROM: Within functional limits [facility]) AROM: Adequate Palpation: WNL  Lumbar Spine  Inspection: No gross anomalies detected Alignment: Symetrical Functional ROM: Within functional limits (WFL) AROM: Adequate Sensory: No sensory anomalies reported or detected Palpation: Tender Provocative Tests: Lumbar Hyperextension and rotation test: deferred Patrick's Maneuver: deferred  Gait Assessment  Gait: WNL  Lower Extremities    Right  Left  Inspection: No gross anomalies detected  Inspection: No gross anomalies detected  Functional ROM: Within functional limits [facility])  Functional ROM: Within functional limits (WFL)  AROM: Adequate  AROM: Adequate  Sensory: No sensory anomalies reported  or detected  Sensory: No sensory anomalies reported or detected  Motor: Unremarkable  Motor: Unremarkable  Toe walk (S1): WNL  Toe walk (S1): WNL  Heal walk (L5): WNL  Heal walk (L5): WNL   Assessment & Plan  Primary Diagnosis & Pertinent Problem List: The primary encounter diagnosis was CRPS (complex regional pain syndrome), type I, upper, right. Diagnoses of Chronic pain, Long term current use of opiate analgesic, Encounter for therapeutic drug level monitoring, Opiate use, and Fibromyalgia were also pertinent to this visit.  Visit Diagnosis: 1. CRPS (complex regional pain syndrome), type I, upper, right   2. Chronic pain   3. Long term current use of opiate analgesic   4. Encounter for therapeutic drug level monitoring   5. Opiate use   6. Fibromyalgia     Problems updated and reviewed during this visit: Problem  CRPS (complex regional pain syndrome), type I, upper (Right)  Opiate use (37.5 MME/Day)  Disorder of Rotator Cuff  Moderate Copd (Chronic Obstructive Pulmonary Disease) (Hcc)    Problem-specific Plan(s): No problem-specific assessment & plan notes found for this encounter.  No new assessment & plan notes have been filed under this hospital service since the last note was generated. Service: Pain Management   Plan of Care   Problem List Items Addressed This Visit      High   Chronic pain (Chronic)   Relevant Medications   oxyCODONE-acetaminophen (PERCOCET/ROXICET) 5-325 MG tablet   oxyCODONE-acetaminophen (PERCOCET/ROXICET) 5-325 MG tablet   oxyCODONE-acetaminophen (PERCOCET/ROXICET) 5-325 MG tablet   cyclobenzaprine (FLEXERIL) 10 MG tablet   CRPS (complex regional pain syndrome), type I, upper (Right) - Primary   Relevant Medications   cyclobenzaprine (FLEXERIL) 10 MG tablet   Other Relevant Orders   STELLATE GANGLION BLOCK   Fibromyalgia (Chronic)   Relevant Medications   cyclobenzaprine (FLEXERIL) 10 MG tablet     Medium   Encounter for therapeutic  drug level monitoring   Long  term current use of opiate analgesic (Chronic)   Relevant Orders   ToxASSURE Select 13 (MW), Urine   Drug Screen 10 W/Conf, Serum   Opiate use (37.5 MME/Day) (Chronic)       Pharmacotherapy (Medications Ordered): Meds ordered this encounter  Medications  . oxyCODONE-acetaminophen (PERCOCET/ROXICET) 5-325 MG tablet    Sig: Take 1 tablet by mouth 5 (five) times daily as needed for moderate pain or severe pain.    Dispense:  150 tablet    Refill:  0    Do not place this medication, or any other prescription from our practice, on "Automatic Refill". Patient may have prescription filled one day early if pharmacy is closed on scheduled refill date. Do not fill until: 03/21/16 To last until: 04/20/16  . oxyCODONE-acetaminophen (PERCOCET/ROXICET) 5-325 MG tablet    Sig: Take 1 tablet by mouth 5 (five) times daily as needed for moderate pain or severe pain.    Dispense:  150 tablet    Refill:  0    Do not place this medication, or any other prescription from our practice, on "Automatic Refill". Patient may have prescription filled one day early if pharmacy is closed on scheduled refill date. Do not fill until: 04/20/16 To last until: 05/20/16  . oxyCODONE-acetaminophen (PERCOCET/ROXICET) 5-325 MG tablet    Sig: Take 1 tablet by mouth 5 (five) times daily as needed for moderate pain or severe pain.    Dispense:  150 tablet    Refill:  0    Do not place this medication, or any other prescription from our practice, on "Automatic Refill". Patient may have prescription filled one day early if pharmacy is closed on scheduled refill date. Do not fill until: 05/20/16 To last until: 06/19/16  . cyclobenzaprine (FLEXERIL) 10 MG tablet    Sig: Take 1 tablet (10 mg total) by mouth 3 (three) times daily.    Dispense:  90 tablet    Refill:  2    Do not place this medication, or any other prescription from our practice, on "Automatic Refill". Patient may have prescription  filled one day early if pharmacy is closed on scheduled refill date.   Lab-work & Procedure Ordered: Orders Placed This Encounter  Procedures  . STELLATE GANGLION BLOCK  . ToxASSURE Select 13 (MW), Urine  . Drug Screen 10 W/Conf, Serum    Imaging Ordered: None  Interventional Therapies: Scheduled:  Right stellate ganglion block under fluoroscopic guidance and IV sedation.    Considering:  None at this time.    PRN Procedures:  None at this time.    Referral(s) or Consult(s): None at this time.  New Prescriptions   No medications on file    Medications administered during this visit: Ms. Boutwell had no medications administered during this visit.  Future Appointments Date Time Provider Department Center  03/21/2016 7:45 AM Delano Metz, MD [facility] None  06/17/2016 7:40 AM Delano Metz, MD [facility] None    Primary Care Physician: Dortha Kern, MD Location: [facility] Note by: Sydnee Levans. Laban Emperor, M.D, DABA, DABAPM, DABPM, DABIPP, FIPP  Pain Score Disclaimer: We use the NRS-11 scale. This is a self-reported, subjective measurement of pain severity with only modest accuracy. It is used primarily to identify changes within a particular patient. It must be understood that outpatient pain scales are significantly less accurate that those used for research, where they can be applied under ideal controlled circumstances with minimal exposure to variables. In reality, the score is likely  to be a combination of pain intensity and pain affect, where pain affect describes the degree of emotional arousal or changes in action readiness caused by the sensory experience of pain. Factors such as social and work situation, setting, emotional state, anxiety levels, expectation, and prior pain experience may influence pain perception and show large inter-individual differences that may also be affected by time variables.

## 2016-03-19 NOTE — Progress Notes (Signed)
Safety precautions to be maintained throughout the outpatient stay will include: orient to surroundings, keep bed in low position, maintain call bell within reach at all times, provide assistance with transfer out of bed and ambulation.   Did not bring medication bottles to the appointment.

## 2016-03-19 NOTE — Progress Notes (Signed)
patient informed per Dr. Laban EmperorNaveira order that if she does not bring her medications for pill count at next visit that he will NOT refill her prescriptions. Patient understands.

## 2016-03-20 ENCOUNTER — Other Ambulatory Visit: Payer: Self-pay | Admitting: Pain Medicine

## 2016-03-21 ENCOUNTER — Ambulatory Visit: Payer: Medicare Other | Admitting: Pain Medicine

## 2016-03-24 LAB — TOXASSURE SELECT 13 (MW), URINE: PDF: 0

## 2016-03-26 ENCOUNTER — Encounter: Payer: Medicare Other | Admitting: Pain Medicine

## 2016-03-26 ENCOUNTER — Ambulatory Visit: Payer: Medicare Other | Admitting: Pain Medicine

## 2016-03-29 ENCOUNTER — Telehealth: Payer: Self-pay

## 2016-03-29 NOTE — Telephone Encounter (Signed)
Patient called and would like to get her Oxycodone filled 4 days early because she is going out of town on 04-18-15.  Her meds are due to be refilled on 04-20-16.  Please see UDS

## 2016-04-01 ENCOUNTER — Telehealth: Payer: Self-pay

## 2016-04-01 NOTE — Telephone Encounter (Signed)
Attempted to call patient to discuss rescheduling.  Unable to leave message.

## 2016-04-01 NOTE — Telephone Encounter (Signed)
Attempted

## 2016-04-02 NOTE — Telephone Encounter (Signed)
Attempted to call patient.  No answer.

## 2016-04-02 NOTE — Telephone Encounter (Signed)
Attempted to call patient again. No answer. 

## 2016-04-03 NOTE — Telephone Encounter (Signed)
Spoke with patient.  Informed her that there would be no early refills.   Notified kathy and transferred call to have procedure rescheduled.  Pre procedure instructions given.

## 2016-04-03 NOTE — Telephone Encounter (Signed)
Attempted to call patient to let her know there would be no early refills and to discuss rescheduling.  No answer or answering machine.  Voice on recording says enter access code.

## 2016-04-15 ENCOUNTER — Other Ambulatory Visit: Payer: Self-pay | Admitting: Family Medicine

## 2016-04-15 DIAGNOSIS — S46011A Strain of muscle(s) and tendon(s) of the rotator cuff of right shoulder, initial encounter: Secondary | ICD-10-CM

## 2016-04-16 ENCOUNTER — Ambulatory Visit (HOSPITAL_BASED_OUTPATIENT_CLINIC_OR_DEPARTMENT_OTHER): Payer: Medicare Other | Admitting: Pain Medicine

## 2016-04-16 ENCOUNTER — Encounter: Payer: Self-pay | Admitting: Pain Medicine

## 2016-04-16 ENCOUNTER — Ambulatory Visit
Admission: RE | Admit: 2016-04-16 | Discharge: 2016-04-16 | Disposition: A | Discharge status: Home / Self Care | Payer: Medicare Other | Source: Ambulatory Visit | Attending: Family Medicine | Admitting: Family Medicine

## 2016-04-16 VITALS — BP 131/87 | HR 79 | Temp 97.8°F | Resp 18 | Ht 59.0 in | Wt 123.0 lb

## 2016-04-16 DIAGNOSIS — I2510 Atherosclerotic heart disease of native coronary artery without angina pectoris: Secondary | ICD-10-CM | POA: Insufficient documentation

## 2016-04-16 DIAGNOSIS — X58XXXA Exposure to other specified factors, initial encounter: Secondary | ICD-10-CM | POA: Diagnosis not present

## 2016-04-16 DIAGNOSIS — G90511 Complex regional pain syndrome I of right upper limb: Secondary | ICD-10-CM | POA: Insufficient documentation

## 2016-04-16 DIAGNOSIS — Z79891 Long term (current) use of opiate analgesic: Secondary | ICD-10-CM | POA: Diagnosis not present

## 2016-04-16 DIAGNOSIS — M797 Fibromyalgia: Secondary | ICD-10-CM | POA: Insufficient documentation

## 2016-04-16 DIAGNOSIS — F1120 Opioid dependence, uncomplicated: Secondary | ICD-10-CM | POA: Diagnosis not present

## 2016-04-16 DIAGNOSIS — E119 Type 2 diabetes mellitus without complications: Secondary | ICD-10-CM | POA: Diagnosis not present

## 2016-04-16 DIAGNOSIS — F411 Generalized anxiety disorder: Secondary | ICD-10-CM | POA: Diagnosis not present

## 2016-04-16 DIAGNOSIS — E7800 Pure hypercholesterolemia, unspecified: Secondary | ICD-10-CM | POA: Insufficient documentation

## 2016-04-16 DIAGNOSIS — Z79899 Other long term (current) drug therapy: Secondary | ICD-10-CM | POA: Insufficient documentation

## 2016-04-16 DIAGNOSIS — S46011A Strain of muscle(s) and tendon(s) of the rotator cuff of right shoulder, initial encounter: Secondary | ICD-10-CM | POA: Diagnosis present

## 2016-04-16 DIAGNOSIS — I252 Old myocardial infarction: Secondary | ICD-10-CM | POA: Insufficient documentation

## 2016-04-16 DIAGNOSIS — M47816 Spondylosis without myelopathy or radiculopathy, lumbar region: Secondary | ICD-10-CM | POA: Insufficient documentation

## 2016-04-16 DIAGNOSIS — N8110 Cystocele, unspecified: Secondary | ICD-10-CM | POA: Insufficient documentation

## 2016-04-16 DIAGNOSIS — R569 Unspecified convulsions: Secondary | ICD-10-CM | POA: Diagnosis not present

## 2016-04-16 DIAGNOSIS — F17200 Nicotine dependence, unspecified, uncomplicated: Secondary | ICD-10-CM | POA: Diagnosis not present

## 2016-04-16 DIAGNOSIS — J449 Chronic obstructive pulmonary disease, unspecified: Secondary | ICD-10-CM | POA: Insufficient documentation

## 2016-04-16 DIAGNOSIS — E559 Vitamin D deficiency, unspecified: Secondary | ICD-10-CM | POA: Diagnosis not present

## 2016-04-16 DIAGNOSIS — I10 Essential (primary) hypertension: Secondary | ICD-10-CM | POA: Insufficient documentation

## 2016-04-16 DIAGNOSIS — G894 Chronic pain syndrome: Secondary | ICD-10-CM | POA: Diagnosis not present

## 2016-04-16 DIAGNOSIS — F329 Major depressive disorder, single episode, unspecified: Secondary | ICD-10-CM | POA: Insufficient documentation

## 2016-04-16 DIAGNOSIS — R5382 Chronic fatigue, unspecified: Secondary | ICD-10-CM | POA: Insufficient documentation

## 2016-04-16 DIAGNOSIS — M5481 Occipital neuralgia: Secondary | ICD-10-CM | POA: Diagnosis not present

## 2016-04-16 MED ORDER — FENTANYL CITRATE (PF) 100 MCG/2ML IJ SOLN: 25.0000 ug | INTRAMUSCULAR | Status: DC | PRN

## 2016-04-16 MED ORDER — BUPIVACAINE-EPINEPHRINE (PF) 0.25% -1:200000 IJ SOLN: 5.0000 mL | Freq: Once | INTRAMUSCULAR | Status: DC

## 2016-04-16 MED ORDER — LIDOCAINE HCL (PF) 1 % IJ SOLN: 5.0000 mL | Freq: Once | INTRAMUSCULAR | Status: DC

## 2016-04-16 MED ORDER — MIDAZOLAM HCL 5 MG/5ML IJ SOLN: 1.0000 mg | INTRAMUSCULAR | Status: DC | PRN

## 2016-04-16 MED ORDER — MIDAZOLAM HCL 5 MG/5ML IJ SOLN
INTRAMUSCULAR | Status: AC
  Administered 2016-04-16: 4 mg via INTRAVENOUS
  Filled 2016-04-16: qty 5

## 2016-04-16 MED ORDER — BUPIVACAINE-EPINEPHRINE (PF) 0.25% -1:200000 IJ SOLN
INTRAMUSCULAR | Status: AC
  Administered 2016-04-16: 09:00:00
  Filled 2016-04-16: qty 30

## 2016-04-16 MED ORDER — FENTANYL CITRATE (PF) 100 MCG/2ML IJ SOLN
INTRAMUSCULAR | Status: AC
  Administered 2016-04-16: 50 ug via INTRAVENOUS
  Filled 2016-04-16: qty 2

## 2016-04-16 MED ORDER — LACTATED RINGERS IV SOLN: 1000.0000 mL | Freq: Once | INTRAVENOUS | Status: DC

## 2016-04-16 NOTE — Patient Instructions (Addendum)
GENERAL RISKS AND COMPLICATIONS  What are the risk, side effects and possible complications? Generally speaking, most procedures are safe.  However, with any procedure there are risks, side effects, and the possibility of complications.  The risks and complications are dependent upon the sites that are lesioned, or the type of nerve block to be performed.  The closer the procedure is to the spine, the more serious the risks are.  Great care is taken when placing the radio frequency needles, block needles or lesioning probes, but sometimes complications can occur. 1. Infection: Any time there is an injection through the skin, there is a risk of infection.  This is why sterile conditions are used for these blocks.  There are four possible types of infection. 1. Localized skin infection. 2. Central Nervous System Infection-This can be in the form of Meningitis, which can be deadly. 3. Epidural Infections-This can be in the form of an epidural abscess, which can cause pressure inside of the spine, causing compression of the spinal cord with subsequent paralysis. This would require an emergency surgery to decompress, and there are no guarantees that the patient would recover from the paralysis. 4. Discitis-This is an infection of the intervertebral discs.  It occurs in about 1% of discography procedures.  It is difficult to treat and it may lead to surgery.        2. Pain: the needles have to go through skin and soft tissues, will cause soreness.       3. Damage to internal structures:  The nerves to be lesioned may be near blood vessels or    other nerves which can be potentially damaged.       4. Bleeding: Bleeding is more common if the patient is taking blood thinners such as  aspirin, Coumadin, Ticiid, Plavix, etc., or if he/she have some genetic predisposition  such as hemophilia. Bleeding into the spinal canal can cause compression of the spinal  cord with subsequent paralysis.  This would require an  emergency surgery to  decompress and there are no guarantees that the patient would recover from the  paralysis.       5. Pneumothorax:  Puncturing of a lung is a possibility, every time a needle is introduced in  the area of the chest or upper back.  Pneumothorax refers to free air around the  collapsed lung(s), inside of the thoracic cavity (chest cavity).  Another two possible  complications related to a similar event would include: Hemothorax and Chylothorax.   These are variations of the Pneumothorax, where instead of air around the collapsed  lung(s), you may have blood or chyle, respectively.       6. Spinal headaches: They may occur with any procedures in the area of the spine.       7. Persistent CSF (Cerebro-Spinal Fluid) leakage: This is a rare problem, but may occur  with prolonged intrathecal or epidural catheters either due to the formation of a fistulous  track or a dural tear.       8. Nerve damage: By working so close to the spinal cord, there is always a possibility of  nerve damage, which could be as serious as a permanent spinal cord injury with  paralysis.       9. Death:  Although rare, severe deadly allergic reactions known as "Anaphylactic  reaction" can occur to any of the medications used.      10. Worsening of the symptoms:  We can always make thing worse.    What are the chances of something like this happening? Chances of any of this occuring are extremely low.  By statistics, you have more of a chance of getting killed in a motor vehicle accident: while driving to the hospital than any of the above occurring .  Nevertheless, you should be aware that they are possibilities.  In general, it is similar to taking a shower.  Everybody knows that you can slip, hit your head and get killed.  Does that mean that you should not shower again?  Nevertheless always keep in mind that statistics do not mean anything if you happen to be on the wrong side of them.  Even if a procedure has a 1  (one) in a 1,000,000 (million) chance of going wrong, it you happen to be that one..Also, keep in mind that by statistics, you have more of a chance of having something go wrong when taking medications.  Who should not have this procedure? If you are on a blood thinning medication (e.g. Coumadin, Plavix, see list of "Blood Thinners"), or if you have an active infection going on, you should not have the procedure.  If you are taking any blood thinners, please inform your physician.  How should I prepare for this procedure?  Do not eat or drink anything at least six hours prior to the procedure.  Bring a driver with you .  It cannot be a taxi.  Come accompanied by an adult that can drive you back, and that is strong enough to help you if your legs get weak or numb from the local anesthetic.  Take all of your medicines the morning of the procedure with just enough water to swallow them.  If you have diabetes, make sure that you are scheduled to have your procedure done first thing in the morning, whenever possible.  If you have diabetes, take only half of your insulin dose and notify our nurse that you have done so as soon as you arrive at the clinic.  If you are diabetic, but only take blood sugar pills (oral hypoglycemic), then do not take them on the morning of your procedure.  You may take them after you have had the procedure.  Do not take aspirin or any aspirin-containing medications, at least eleven (11) days prior to the procedure.  They may prolong bleeding.  Wear loose fitting clothing that may be easy to take off and that you would not mind if it got stained with Betadine or blood.  Do not wear any jewelry or perfume  Remove any nail coloring.  It will interfere with some of our monitoring equipment.  NOTE: Remember that this is not meant to be interpreted as a complete list of all possible complications.  Unforeseen problems may occur.  BLOOD THINNERS The following drugs  contain aspirin or other products, which can cause increased bleeding during surgery and should not be taken for 2 weeks prior to and 1 week after surgery.  If you should need take something for relief of minor pain, you may take acetaminophen which is found in Tylenol,m Datril, Anacin-3 and Panadol. It is not blood thinner. The products listed below are.  Do not take any of the products listed below in addition to any listed on your instruction sheet.  A.P.C or A.P.C with Codeine Codeine Phosphate Capsules #3 Ibuprofen Ridaura  ABC compound Congesprin Imuran rimadil  Advil Cope Indocin Robaxisal  Alka-Seltzer Effervescent Pain Reliever and Antacid Coricidin or Coricidin-D  Indomethacin Rufen    Alka-Seltzer plus Cold Medicine Cosprin Ketoprofen S-A-C Tablets  Anacin Analgesic Tablets or Capsules Coumadin Korlgesic Salflex  Anacin Extra Strength Analgesic tablets or capsules CP-2 Tablets Lanoril Salicylate  Anaprox Cuprimine Capsules Levenox Salocol  Anexsia-D Dalteparin Magan Salsalate  Anodynos Darvon compound Magnesium Salicylate Sine-off  Ansaid Dasin Capsules Magsal Sodium Salicylate  Anturane Depen Capsules Marnal Soma  APF Arthritis pain formula Dewitt's Pills Measurin Stanback  Argesic Dia-Gesic Meclofenamic Sulfinpyrazone  Arthritis Bayer Timed Release Aspirin Diclofenac Meclomen Sulindac  Arthritis pain formula Anacin Dicumarol Medipren Supac  Analgesic (Safety coated) Arthralgen Diffunasal Mefanamic Suprofen  Arthritis Strength Bufferin Dihydrocodeine Mepro Compound Suprol  Arthropan liquid Dopirydamole Methcarbomol with Aspirin Synalgos  ASA tablets/Enseals Disalcid Micrainin Tagament  Ascriptin Doan's Midol Talwin  Ascriptin A/D Dolene Mobidin Tanderil  Ascriptin Extra Strength Dolobid Moblgesic Ticlid  Ascriptin with Codeine Doloprin or Doloprin with Codeine Momentum Tolectin  Asperbuf Duoprin Mono-gesic Trendar  Aspergum Duradyne Motrin or Motrin IB Triminicin  Aspirin  plain, buffered or enteric coated Durasal Myochrisine Trigesic  Aspirin Suppositories Easprin Nalfon Trillsate  Aspirin with Codeine Ecotrin Regular or Extra Strength Naprosyn Uracel  Atromid-S Efficin Naproxen Ursinus  Auranofin Capsules Elmiron Neocylate Vanquish  Axotal Emagrin Norgesic Verin  Azathioprine Empirin or Empirin with Codeine Normiflo Vitamin E  Azolid Emprazil Nuprin Voltaren  Bayer Aspirin plain, buffered or children's or timed BC Tablets or powders Encaprin Orgaran Warfarin Sodium  Buff-a-Comp Enoxaparin Orudis Zorpin  Buff-a-Comp with Codeine Equegesic Os-Cal-Gesic   Buffaprin Excedrin plain, buffered or Extra Strength Oxalid   Bufferin Arthritis Strength Feldene Oxphenbutazone   Bufferin plain or Extra Strength Feldene Capsules Oxycodone with Aspirin   Bufferin with Codeine Fenoprofen Fenoprofen Pabalate or Pabalate-SF   Buffets II Flogesic Panagesic   Buffinol plain or Extra Strength Florinal or Florinal with Codeine Panwarfarin   Buf-Tabs Flurbiprofen Penicillamine   Butalbital Compound Four-way cold tablets Penicillin   Butazolidin Fragmin Pepto-Bismol   Carbenicillin Geminisyn Percodan   Carna Arthritis Reliever Geopen Persantine   Carprofen Gold's salt Persistin   Chloramphenicol Goody's Phenylbutazone   Chloromycetin Haltrain Piroxlcam   Clmetidine heparin Plaquenil   Cllnoril Hyco-pap Ponstel   Clofibrate Hydroxy chloroquine Propoxyphen         Before stopping any of these medications, be sure to consult the physician who ordered them.  Some, such as Coumadin (Warfarin) are ordered to prevent or treat serious conditions such as "deep thrombosis", "pumonary embolisms", and other heart problems.  The amount of time that you may need off of the medication may also vary with the medication and the reason for which you were taking it.  If you are taking any of these medications, please make sure you notify your pain physician before you undergo any  procedures.         GENERAL RISKS AND COMPLICATIONS  What are the risk, side effects and possible complications? Generally speaking, most procedures are safe.  However, with any procedure there are risks, side effects, and the possibility of complications.  The risks and complications are dependent upon the sites that are lesioned, or the type of nerve block to be performed.  The closer the procedure is to the spine, the more serious the risks are.  Great care is taken when placing the radio frequency needles, block needles or lesioning probes, but sometimes complications can occur. 2. Infection: Any time there is an injection through the skin, there is a risk of infection.  This is why sterile conditions are used for these blocks.  There are   four possible types of infection. 1. Localized skin infection. 2. Central Nervous System Infection-This can be in the form of Meningitis, which can be deadly. 3. Epidural Infections-This can be in the form of an epidural abscess, which can cause pressure inside of the spine, causing compression of the spinal cord with subsequent paralysis. This would require an emergency surgery to decompress, and there are no guarantees that the patient would recover from the paralysis. 4. Discitis-This is an infection of the intervertebral discs.  It occurs in about 1% of discography procedures.  It is difficult to treat and it may lead to surgery.        2. Pain: the needles have to go through skin and soft tissues, will cause soreness.       3. Damage to internal structures:  The nerves to be lesioned may be near blood vessels or    other nerves which can be potentially damaged.       4. Bleeding: Bleeding is more common if the patient is taking blood thinners such as  aspirin, Coumadin, Ticiid, Plavix, etc., or if he/she have some genetic predisposition  such as hemophilia. Bleeding into the spinal canal can cause compression of the spinal  cord with subsequent  paralysis.  This would require an emergency surgery to  decompress and there are no guarantees that the patient would recover from the  paralysis.       5. Pneumothorax:  Puncturing of a lung is a possibility, every time a needle is introduced in  the area of the chest or upper back.  Pneumothorax refers to free air around the  collapsed lung(s), inside of the thoracic cavity (chest cavity).  Another two possible  complications related to a similar event would include: Hemothorax and Chylothorax.   These are variations of the Pneumothorax, where instead of air around the collapsed  lung(s), you may have blood or chyle, respectively.       6. Spinal headaches: They may occur with any procedures in the area of the spine.       7. Persistent CSF (Cerebro-Spinal Fluid) leakage: This is a rare problem, but may occur  with prolonged intrathecal or epidural catheters either due to the formation of a fistulous  track or a dural tear.       8. Nerve damage: By working so close to the spinal cord, there is always a possibility of  nerve damage, which could be as serious as a permanent spinal cord injury with  paralysis.       9. Death:  Although rare, severe deadly allergic reactions known as "Anaphylactic  reaction" can occur to any of the medications used.      10. Worsening of the symptoms:  We can always make thing worse.  What are the chances of something like this happening? Chances of any of this occuring are extremely low.  By statistics, you have more of a chance of getting killed in a motor vehicle accident: while driving to the hospital than any of the above occurring .  Nevertheless, you should be aware that they are possibilities.  In general, it is similar to taking a shower.  Everybody knows that you can slip, hit your head and get killed.  Does that mean that you should not shower again?  Nevertheless always keep in mind that statistics do not mean anything if you happen to be on the wrong side of  them.  Even if a procedure has a 1 (one)   in a 1,000,000 (million) chance of going wrong, it you happen to be that one..Also, keep in mind that by statistics, you have more of a chance of having something go wrong when taking medications.  Who should not have this procedure? If you are on a blood thinning medication (e.g. Coumadin, Plavix, see list of "Blood Thinners"), or if you have an active infection going on, you should not have the procedure.  If you are taking any blood thinners, please inform your physician.  How should I prepare for this procedure?  Do not eat or drink anything at least six hours prior to the procedure.  Bring a driver with you .  It cannot be a taxi.  Come accompanied by an adult that can drive you back, and that is strong enough to help you if your legs get weak or numb from the local anesthetic.  Take all of your medicines the morning of the procedure with just enough water to swallow them.  If you have diabetes, make sure that you are scheduled to have your procedure done first thing in the morning, whenever possible.  If you have diabetes, take only half of your insulin dose and notify our nurse that you have done so as soon as you arrive at the clinic.  If you are diabetic, but only take blood sugar pills (oral hypoglycemic), then do not take them on the morning of your procedure.  You may take them after you have had the procedure.  Do not take aspirin or any aspirin-containing medications, at least eleven (11) days prior to the procedure.  They may prolong bleeding.  Wear loose fitting clothing that may be easy to take off and that you would not mind if it got stained with Betadine or blood.  Do not wear any jewelry or perfume  Remove any nail coloring.  It will interfere with some of our monitoring equipment.  NOTE: Remember that this is not meant to be interpreted as a complete list of all possible complications.  Unforeseen problems may occur.  BLOOD  THINNERS The following drugs contain aspirin or other products, which can cause increased bleeding during surgery and should not be taken for 2 weeks prior to and 1 week after surgery.  If you should need take something for relief of minor pain, you may take acetaminophen which is found in Tylenol,m Datril, Anacin-3 and Panadol. It is not blood thinner. The products listed below are.  Do not take any of the products listed below in addition to any listed on your instruction sheet.  A.P.C or A.P.C with Codeine Codeine Phosphate Capsules #3 Ibuprofen Ridaura  ABC compound Congesprin Imuran rimadil  Advil Cope Indocin Robaxisal  Alka-Seltzer Effervescent Pain Reliever and Antacid Coricidin or Coricidin-D  Indomethacin Rufen  Alka-Seltzer plus Cold Medicine Cosprin Ketoprofen S-A-C Tablets  Anacin Analgesic Tablets or Capsules Coumadin Korlgesic Salflex  Anacin Extra Strength Analgesic tablets or capsules CP-2 Tablets Lanoril Salicylate  Anaprox Cuprimine Capsules Levenox Salocol  Anexsia-D Dalteparin Magan Salsalate  Anodynos Darvon compound Magnesium Salicylate Sine-off  Ansaid Dasin Capsules Magsal Sodium Salicylate  Anturane Depen Capsules Marnal Soma  APF Arthritis pain formula Dewitt's Pills Measurin Stanback  Argesic Dia-Gesic Meclofenamic Sulfinpyrazone  Arthritis Bayer Timed Release Aspirin Diclofenac Meclomen Sulindac  Arthritis pain formula Anacin Dicumarol Medipren Supac  Analgesic (Safety coated) Arthralgen Diffunasal Mefanamic Suprofen  Arthritis Strength Bufferin Dihydrocodeine Mepro Compound Suprol  Arthropan liquid Dopirydamole Methcarbomol with Aspirin Synalgos  ASA tablets/Enseals Disalcid Micrainin Tagament  Ascriptin   Doan's Midol Talwin  Ascriptin A/D Dolene Mobidin Tanderil  Ascriptin Extra Strength Dolobid Moblgesic Ticlid  Ascriptin with Codeine Doloprin or Doloprin with Codeine Momentum Tolectin  Asperbuf Duoprin Mono-gesic Trendar  Aspergum Duradyne Motrin or Motrin  IB Triminicin  Aspirin plain, buffered or enteric coated Durasal Myochrisine Trigesic  Aspirin Suppositories Easprin Nalfon Trillsate  Aspirin with Codeine Ecotrin Regular or Extra Strength Naprosyn Uracel  Atromid-S Efficin Naproxen Ursinus  Auranofin Capsules Elmiron Neocylate Vanquish  Axotal Emagrin Norgesic Verin  Azathioprine Empirin or Empirin with Codeine Normiflo Vitamin E  Azolid Emprazil Nuprin Voltaren  Bayer Aspirin plain, buffered or children's or timed BC Tablets or powders Encaprin Orgaran Warfarin Sodium  Buff-a-Comp Enoxaparin Orudis Zorpin  Buff-a-Comp with Codeine Equegesic Os-Cal-Gesic   Buffaprin Excedrin plain, buffered or Extra Strength Oxalid   Bufferin Arthritis Strength Feldene Oxphenbutazone   Bufferin plain or Extra Strength Feldene Capsules Oxycodone with Aspirin   Bufferin with Codeine Fenoprofen Fenoprofen Pabalate or Pabalate-SF   Buffets II Flogesic Panagesic   Buffinol plain or Extra Strength Florinal or Florinal with Codeine Panwarfarin   Buf-Tabs Flurbiprofen Penicillamine   Butalbital Compound Four-way cold tablets Penicillin   Butazolidin Fragmin Pepto-Bismol   Carbenicillin Geminisyn Percodan   Carna Arthritis Reliever Geopen Persantine   Carprofen Gold's salt Persistin   Chloramphenicol Goody's Phenylbutazone   Chloromycetin Haltrain Piroxlcam   Clmetidine heparin Plaquenil   Cllnoril Hyco-pap Ponstel   Clofibrate Hydroxy chloroquine Propoxyphen         Before stopping any of these medications, be sure to consult the physician who ordered them.  Some, such as Coumadin (Warfarin) are ordered to prevent or treat serious conditions such as "deep thrombosis", "pumonary embolisms", and other heart problems.  The amount of time that you may need off of the medication may also vary with the medication and the reason for which you were taking it.  If you are taking any of these medications, please make sure you notify your pain physician before you  undergo any procedures.          

## 2016-04-16 NOTE — Progress Notes (Signed)
Safety precautions to be maintained throughout the outpatient stay will include: orient to surroundings, keep bed in low position, maintain call bell within reach at all times, provide assistance with transfer out of bed and ambulation.  

## 2016-04-16 NOTE — Progress Notes (Signed)
Patient's Name: Michelle Newton  Patient type: Established  MRN: [id number]  Service setting: Ambulatory outpatient  DOB: 04/06/1961  Location: [facility]  DOS: 04/16/2016  Primary Care Physician: Dortha Kern, MD  Note by: Sydnee Levans Laban Emperor, M.D, DABA, DABAPM, DABPM, Olga Coaster, FIPP  Referring Physician: Dortha Kern, MD  Specialty: Board-Certified Interventional Pain Management  Last Visit to Pain Management: 04/01/2016   Primary Reason(s) for Visit: Interventional Pain Management Treatment. CC: Shoulder Pain  Primary Diagnosis: CRPS (complex regional pain syndrome), type I, upper, right [G90.511]   Procedure:  Anesthesia, Analgesia, Anxiolysis:  Type: Diagnostic  Stellate Ganglion Block Region: Anterior Cervical Level: Chassaignac's tubercle   Laterality: Right-Sided Paramedial  Indications: 1. CRPS (complex regional pain syndrome), type I, upper, right     Pre-procedure Pain Score: 8/10 Reported level of pain is compatible with clinical observations Post-procedure Pain Score: 0-No pain  Type: Moderate (Conscious) Sedation & Local Anesthesia Local Anesthetic: Lidocaine 1% Route: Intravenous (IV) IV Access: Secured Sedation: Meaningful verbal contact was maintained at all times during the procedure  Indication(s): Analgesia & Anxiolysis   Pre-Procedure Assessment:  Michelle Newton is a 55 y.o. year old, female patient, seen today for interventional treatment. She has Encounter for therapeutic drug level monitoring; Encounter for long-term use of opiate analgesic; Uncomplicated opioid dependence (HCC); Opiate use (37.5 MME/Day); Chronic right shoulder pain; Cervical facet syndrome; Cervical spondylosis; Chronic neck pain; Chronic left hip pain; Lumbar facet syndrome; Lumbar spondylosis; Failed back surgical syndrome; Chronic low back pain; Fibromyalgia; Chronic pain syndrome; Disorder of shoulder; Chronic generalized pain; Moderate COPD (chronic obstructive  pulmonary disease) (HCC); Bladder cystocele; Essential (primary) hypertension; H/O acute myocardial infarction; Rotator cuff syndrome; Cervical pain; Chronic thumb pain, bilateral; Upper extremity pain; Dorsalgia of occipito-atlanto-axial region; Occipital neuralgia; Smoker; Generalized anxiety disorder; Depression; Wrist arthritis; Fracture five ribs-closed; Chronic obstructive pulmonary disease (COPD) (HCC); History of seizures; History of migraine; H/O non-insulin dependent diabetes mellitus; Essential hypertension; High cholesterol; Coronary artery disease; History of cardiac arrhythmia; H/O vertigo; Vitamin D insufficiency; Chronic radicular cervical pain, left-sided; Chronic fatigue syndrome; Family history of chronic pain; History of knee surgery; History of elbow surgery; Long term current use of opiate analgesic; Long term prescription opiate use; Opioid dependence, daily use (HCC); Chronic pain; Joint disorder; Disorder of rotator cuff; and CRPS (complex regional pain syndrome), type I, upper (Right) on her problem list.. Her primarily concern today is the Shoulder Pain   Pain Orientation: Right Pain Descriptors / Indicators: Stabbing Pain Frequency: Constant  Date of Last Visit: 03/05/16 Service Provided on Last Visit: Med Refill  Verification of the correct person, correct site (including marking of site), and correct procedure were performed and confirmed by the patient.  Consent: Secured. Under the influence of no sedatives a written informed consent was obtained, after having provided information on the risks and possible complications. To fulfill our ethical and legal obligations, as recommended by the American Medical Association's Code of Ethics, we have provided information to the patient about our clinical impression; the nature and purpose of the treatment or procedure; the risks, benefits, and possible complications of the intervention; alternatives; the risk(s) and benefit(s) of  the alternative treatment(s) or procedure(s); and the risk(s) and benefit(s) of doing nothing. The patient was provided information about the risks and possible complications associated with the procedure. These include, but are not limited to, failure to achieve desired goals, infection, bleeding, organ or nerve damage, allergic reactions, paralysis, and death. In the case of spinal  procedures these may include, but are not limited to, failure to achieve desired goals, infection, bleeding, organ or nerve damage, allergic reactions, paralysis, and death. In addition, the patient was informed that Medicine is not an exact science; therefore, there is also the possibility of unforeseen risks and possible complications that may result in a catastrophic outcome. The patient indicated having understood very clearly. We have given the patient no guarantees and we have made no promises. Enough time was given to the patient to ask questions, all of which were answered to the patient's satisfaction.  Consent Attestation: I, the ordering provider, attest that I have discussed with the patient the benefits, risks, side-effects, alternatives, likelihood of achieving goals, and potential problems during recovery for the procedure that I have provided informed consent.  Pre-Procedure Preparation: Safety Precautions: Allergies reviewed. Appropriate site, procedure, and patient were confirmed by following the Joint Commission's Universal Protocol (UP.01.01.01), in the form of a "Time Out". The patient was asked to confirm marked site and procedure, before commencing. The patient was asked about blood thinners, or active infections, both of which were denied. Patient was assessed for positional comfort and all pressure points were checked before starting procedure. Allergies: She is allergic to cymbalta; duloxetine; flagyl; lyrica; vicodin; and nsaids.. Infection Control Precautions: Sterile technique used. Standard  Universal Precautions were taken as recommended by the Department of [facility] for Disease Control and Prevention (CDC). Standard pre-surgical skin prep was conducted. Respiratory hygiene and cough etiquette was practiced. Hand hygiene observed. Safe injection practices and needle disposal techniques followed. SDV (single dose vial) medications used. Medications properly checked for expiration dates and contaminants. Personal protective equipment (PPE) used: Sterile Radiation-resistant gloves. Monitoring:  As per clinic protocol. Filed Vitals:   04/16/16 0904 04/16/16 0909 04/16/16 0917 04/16/16 0927  BP: 122/91 134/88 135/86 131/87  Pulse: 69 81 81 79  Temp:      TempSrc:      Resp: 12 23 17 18   Height:      Weight:      SpO2: 94% 94% 94% 95%  Calculated BMI: Body mass index is 24.83 kg/(m^2).  Description of Procedure Process:   Time-out: "Time-out" completed before starting procedure, as per protocol. Position: Supine Target Area: Chassaignac's tubercle at the C6 vertebral body level, lateral to the long goals: Lee muscle and medial to the scalene muscles. Approach: Anterior classic approach. Area Prepped: Entire Anterior Neck Region Prepping solution: ChloraPrep (2% chlorhexidine gluconate and 70% isopropyl alcohol) Safety Precautions: Aspiration looking for blood return was conducted prior to all injections. At no point did we inject any substances, as a needle was being advanced. No attempts were made at seeking any paresthesias. Safe injection practices and needle disposal techniques used. Medications properly checked for expiration dates. SDV (single dose vial) medications used.   Description of the Procedure: Protocol guidelines were followed. The patient was placed in position over the procedure table. The target area was identified and the area prepped in the usual manner. Skin & deeper tissues infiltrated with local anesthetic. Appropriate amount of time allowed to pass  for local anesthetics to take effect. The procedure needles were then advanced to the target area. Proper needle placement secured. Negative aspiration confirmed. Solution injected in intermittent fashion, asking for systemic symptoms every 0.5cc of injectate. The needles were then removed and the area cleansed, making sure to leave some of the prepping solution back to take advantage of its long term bactericidal properties. EBL: None Materials & Medications Used:  Needle(s) Used: 22g - 1.5" Needle(s) Solution Injected: 0.25% MPF-Bupivacaine with epinephrine 1:200,000 (5 mL) +1% MPF-Lidocaine (5 mL).  Imaging Guidance:  Type of Imaging Technique: Fluoroscopy Guidance (Non-spinal) Indication(s): Assistance in needle guidance and placement for procedures requiring needle placement in or near specific anatomical locations not easily accessible without such assistance. Exposure Time: Please see nurses notes. Contrast: None used. Fluoroscopic Guidance: I was personally present in the fluoroscopy suite, where the patient was placed in position for the procedure, over the fluoroscopy-compatible table. Fluoroscopy was manipulated, using "Tunnel Vision Technique", to obtain the best possible view of the target area, on the affected side. Parallax error was corrected before commencing the procedure. A "direction-depth-direction" technique was used to introduce the needle under continuous pulsed fluoroscopic guidance. Permanently recorded images stored by scanning into EMR. Interpretation: No contrast injected. Intraoperative imaging interpretation by performing Physician. Adequate needle placement confirmed. Permanent hardcopy images in multiple planes scanned into the patient's record.  Antibiotic Prophylaxis:  Indication(s): No indications identified. Type:  Antibiotics Given (last 72 hours)    None       Post-operative Assessment:  Complications: No immediate post-treatment complications were  observed. Disposition: The patient tolerated the entire procedure well. A repeat set of vitals were taken after the procedure and the patient was kept under observation following institutional policy, for this type of procedure. Post-procedural neurological assessment was performed, showing return to baseline, prior to discharge. The patient was provided with post-procedure discharge instructions, including a section on how to identify potential problems. Should any problems arise concerning this procedure, the patient was given instructions to immediately contact us, at any time, without hesitation. In any case, we plan to contact the patient by telephone for a follow-up status report regarding this interventional procedure. Discharge to: Discharge home Follow-up: Return for Post-Procedure Eval (2 weeks). Comments:  No additional relevant information.  Orders Placed This Encounter  Procedures  . STELLATE GANGLION BLOCK    Scheduling Instructions:     Side: Right-sided     Sedation: With Sedation.     Timeframe: Today    Order Specific Question:  Where will this procedure be performed?    Answer:  [facility]    Medications administered during this visit: We administered fentaNYL, midazolam, and bupivacaine-epinephrine.  Prescriptions ordered during this visit: Meds ordered this encounter  Medications  . fentaNYL (SUBLIMAZE) 100 MCG/2ML injection    Sig:     Jarold Motto, Delores: cabinet override  . midazolam (VERSED) 5 MG/5ML injection    Sig:     Jarold Motto, Delores: cabinet override  . fentaNYL (SUBLIMAZE) injection 25-50 mcg    Sig:     Make sure Narcan is available in the pyxis when using this medication. In the event of respiratory depression (RR< 8/min): Titrate NARCAN (naloxone) in increments of 0.1 to 0.2 mg IV at 2-3 minute intervals, until desired degree of reversal.  . lactated ringers infusion 1,000 mL    Sig:   . midazolam (VERSED) 5 MG/5ML injection 1-2 mg     Sig:     Make sure Flumazenil is available in the pyxis when using this medication. If oversedation occurs, administer 0.2 mg IV over 15 sec. If after 45 sec no response, administer 0.2 mg again over 1 min; may repeat at 1 min intervals; not to exceed 4 doses (1 mg)  . bupivacaine-epinephrine (MARCAINE W/ EPI) 0.25% -1:200000 injection 5 mL    Sig:   . lidocaine (PF) (XYLOCAINE) 1 % injection 5 mL  Sig:   . bupivacaine-epinephrine (MARCAINE W/ EPI) 0.25% -1:200000 injection    Sig:     Jeannine Kitten: cabinet override    Future Appointments Date Time Provider Department Center  04/30/2016 8:45 AM Delano Metz, MD [facility] None  06/17/2016 7:40 AM Delano Metz, MD [facility] None    Primary Care Physician: Dortha Kern, MD Location: [facility] Note by: Sydnee Levans. Laban Emperor, M.D, DABA, DABAPM, DABPM, DABIPP, FIPP  Disclaimer:  Medicine is not an exact science. The only guarantee in medicine is that nothing is guaranteed. It is important to note that the decision to proceed with this intervention was based on the information collected from the patient. The Data and conclusions were drawn from the patient's questionnaire, the interview, and the physical examination. Because the information was provided in large part by the patient, it cannot be guaranteed that it has not been purposely or unconsciously manipulated. Every effort has been made to obtain as much relevant data as possible for this evaluation. It is important to note that the conclusions that lead to this procedure are derived in large part from the available data. Always take into account that the treatment will also be dependent on availability of resources and existing treatment guidelines, considered by other Pain Management Practitioners as being common knowledge and practice, at the time of the intervention. For Medico-Legal purposes, it is also important to point out that variation in  procedural techniques and pharmacological choices are the acceptable norm. The indications, contraindications, technique, and results of the above procedure should only be interpreted and judged by a Board-Certified Interventional Pain Specialist with extensive familiarity and expertise in the same exact procedure and technique. Attempts at providing opinions without similar or greater experience and expertise than that of the treating physician will be considered as inappropriate and unethical, and shall result in a formal complaint to the state medical board and applicable specialty societies. Pain Score: We use the NRS-11 scale. This is a self-reported, subjective measurement of pain severity with only modest accuracy. It is used primarily to identify changes within a particular patient. It must be understood that outpatient pain scales are significantly less accurate that those used for research, where they can be applied under ideal controlled circumstances with minimal exposure to variables. In reality, the score is likely to be a combination of pain intensity and pain affect, where pain affect describes the degree of emotional arousal or changes in action readiness caused by the sensory experience of pain. Factors such as social and work situation, setting, emotional state, anxiety levels, expectation, and prior pain experience may influence pain perception and show large inter-individual differences that may also be affected by time variables.  Patient instructions provided during this appointment: Patient Instructions   GENERAL RISKS AND COMPLICATIONS  What are the risk, side effects and possible complications? Generally speaking, most procedures are safe.  However, with any procedure there are risks, side effects, and the possibility of complications.  The risks and complications are dependent upon the sites that are lesioned, or the type of nerve block to be performed.  The closer the procedure  is to the spine, the more serious the risks are.  Great care is taken when placing the radio frequency needles, block needles or lesioning probes, but sometimes complications can occur. 1. Infection: Any time there is an injection through the skin, there is a risk of infection.  This is why sterile conditions are used for these blocks.  There are  four possible types of infection. 1. Localized skin infection. 2. Central Nervous System Infection-This can be in the form of Meningitis, which can be deadly. 3. Epidural Infections-This can be in the form of an epidural abscess, which can cause pressure inside of the spine, causing compression of the spinal cord with subsequent paralysis. This would require an emergency surgery to decompress, and there are no guarantees that the patient would recover from the paralysis. 4. Discitis-This is an infection of the intervertebral discs.  It occurs in about 1% of discography procedures.  It is difficult to treat and it may lead to surgery.        2. Pain: the needles have to go through skin and soft tissues, will cause soreness.       3. Damage to internal structures:  The nerves to be lesioned may be near blood vessels or    other nerves which can be potentially damaged.       4. Bleeding: Bleeding is more common if the patient is taking blood thinners such as  aspirin, Coumadin, Ticiid, Plavix, etc., or if he/she have some genetic predisposition  such as hemophilia. Bleeding into the spinal canal can cause compression of the spinal  cord with subsequent paralysis.  This would require an emergency surgery to  decompress and there are no guarantees that the patient would recover from the  paralysis.       5. Pneumothorax:  Puncturing of a lung is a possibility, every time a needle is introduced in  the area of the chest or upper back.  Pneumothorax refers to free air around the  collapsed lung(s), inside of the thoracic cavity (chest cavity).  Another two possible   complications related to a similar event would include: Hemothorax and Chylothorax.   These are variations of the Pneumothorax, where instead of air around the collapsed  lung(s), you may have blood or chyle, respectively.       6. Spinal headaches: They may occur with any procedures in the area of the spine.       7. Persistent CSF (Cerebro-Spinal Fluid) leakage: This is a rare problem, but may occur  with prolonged intrathecal or epidural catheters either due to the formation of a fistulous  track or a dural tear.       8. Nerve damage: By working so close to the spinal cord, there is always a possibility of  nerve damage, which could be as serious as a permanent spinal cord injury with  paralysis.       9. Death:  Although rare, severe deadly allergic reactions known as "Anaphylactic  reaction" can occur to any of the medications used.      10. Worsening of the symptoms:  We can always make thing worse.  What are the chances of something like this happening? Chances of any of this occuring are extremely low.  By statistics, you have more of a chance of getting killed in a motor vehicle accident: while driving to the hospital than any of the above occurring .  Nevertheless, you should be aware that they are possibilities.  In general, it is similar to taking a shower.  Everybody knows that you can slip, hit your head and get killed.  Does that mean that you should not shower again?  Nevertheless always keep in mind that statistics do not mean anything if you happen to be on the wrong side of them.  Even if a procedure has a 1 (one) in  a 1,000,000 (million) chance of going wrong, it you happen to be that one..Also, keep in mind that by statistics, you have more of a chance of having something go wrong when taking medications.  Who should not have this procedure? If you are on a blood thinning medication (e.g. Coumadin, Plavix, see list of "Blood Thinners"), or if you have an active infection going on,  you should not have the procedure.  If you are taking any blood thinners, please inform your physician.  How should I prepare for this procedure?  Do not eat or drink anything at least six hours prior to the procedure.  Bring a driver with you .  It cannot be a taxi.  Come accompanied by an adult that can drive you back, and that is strong enough to help you if your legs get weak or numb from the local anesthetic.  Take all of your medicines the morning of the procedure with just enough water to swallow them.  If you have diabetes, make sure that you are scheduled to have your procedure done first thing in the morning, whenever possible.  If you have diabetes, take only half of your insulin dose and notify our nurse that you have done so as soon as you arrive at the clinic.  If you are diabetic, but only take blood sugar pills (oral hypoglycemic), then do not take them on the morning of your procedure.  You may take them after you have had the procedure.  Do not take aspirin or any aspirin-containing medications, at least eleven (11) days prior to the procedure.  They may prolong bleeding.  Wear loose fitting clothing that may be easy to take off and that you would not mind if it got stained with Betadine or blood.  Do not wear any jewelry or perfume  Remove any nail coloring.  It will interfere with some of our monitoring equipment.  NOTE: Remember that this is not meant to be interpreted as a complete list of all possible complications.  Unforeseen problems may occur.  BLOOD THINNERS The following drugs contain aspirin or other products, which can cause increased bleeding during surgery and should not be taken for 2 weeks prior to and 1 week after surgery.  If you should need take something for relief of minor pain, you may take acetaminophen which is found in Tylenol,m Datril, Anacin-3 and Panadol. It is not blood thinner. The products listed below are.  Do not take any of the  products listed below in addition to any listed on your instruction sheet.  A.P.C or A.P.C with Codeine Codeine Phosphate Capsules #3 Ibuprofen Ridaura  ABC compound Congesprin Imuran rimadil  Advil Cope Indocin Robaxisal  Alka-Seltzer Effervescent Pain Reliever and Antacid Coricidin or Coricidin-D  Indomethacin Rufen  Alka-Seltzer plus Cold Medicine Cosprin Ketoprofen S-A-C Tablets  Anacin Analgesic Tablets or Capsules Coumadin Korlgesic Salflex  Anacin Extra Strength Analgesic tablets or capsules CP-2 Tablets Lanoril Salicylate  Anaprox Cuprimine Capsules Levenox Salocol  Anexsia-D Dalteparin Magan Salsalate  Anodynos Darvon compound Magnesium Salicylate Sine-off  Ansaid Dasin Capsules Magsal Sodium Salicylate  Anturane Depen Capsules Marnal Soma  APF Arthritis pain formula Dewitt's Pills Measurin Stanback  Argesic Dia-Gesic Meclofenamic Sulfinpyrazone  Arthritis Bayer Timed Release Aspirin Diclofenac Meclomen Sulindac  Arthritis pain formula Anacin Dicumarol Medipren Supac  Analgesic (Safety coated) Arthralgen Diffunasal Mefanamic Suprofen  Arthritis Strength Bufferin Dihydrocodeine Mepro Compound Suprol  Arthropan liquid Dopirydamole Methcarbomol with Aspirin Synalgos  ASA tablets/Enseals Disalcid Micrainin Tagament  Ascriptin  Doan's Midol Talwin  Ascriptin A/D Dolene Mobidin Tanderil  Ascriptin Extra Strength Dolobid Moblgesic Ticlid  Ascriptin with Codeine Doloprin or Doloprin with Codeine Momentum Tolectin  Asperbuf Duoprin Mono-gesic Trendar  Aspergum Duradyne Motrin or Motrin IB Triminicin  Aspirin plain, buffered or enteric coated Durasal Myochrisine Trigesic  Aspirin Suppositories Easprin Nalfon Trillsate  Aspirin with Codeine Ecotrin Regular or Extra Strength Naprosyn Uracel  Atromid-S Efficin Naproxen Ursinus  Auranofin Capsules Elmiron Neocylate Vanquish  Axotal Emagrin Norgesic Verin  Azathioprine Empirin or Empirin with Codeine Normiflo Vitamin E  Azolid Emprazil  Nuprin Voltaren  Bayer Aspirin plain, buffered or children's or timed BC Tablets or powders Encaprin Orgaran Warfarin Sodium  Buff-a-Comp Enoxaparin Orudis Zorpin  Buff-a-Comp with Codeine Equegesic Os-Cal-Gesic   Buffaprin Excedrin plain, buffered or Extra Strength Oxalid   Bufferin Arthritis Strength Feldene Oxphenbutazone   Bufferin plain or Extra Strength Feldene Capsules Oxycodone with Aspirin   Bufferin with Codeine Fenoprofen Fenoprofen Pabalate or Pabalate-SF   Buffets II Flogesic Panagesic   Buffinol plain or Extra Strength Florinal or Florinal with Codeine Panwarfarin   Buf-Tabs Flurbiprofen Penicillamine   Butalbital Compound Four-way cold tablets Penicillin   Butazolidin Fragmin Pepto-Bismol   Carbenicillin Geminisyn Percodan   Carna Arthritis Reliever Geopen Persantine   Carprofen Gold's salt Persistin   Chloramphenicol Goody's Phenylbutazone   Chloromycetin Haltrain Piroxlcam   Clmetidine heparin Plaquenil   Cllnoril Hyco-pap Ponstel   Clofibrate Hydroxy chloroquine Propoxyphen         Before stopping any of these medications, be sure to consult the physician who ordered them.  Some, such as Coumadin (Warfarin) are ordered to prevent or treat serious conditions such as "deep thrombosis", "pumonary embolisms", and other heart problems.  The amount of time that you may need off of the medication may also vary with the medication and the reason for which you were taking it.  If you are taking any of these medications, please make sure you notify your pain physician before you undergo any procedures.         GENERAL RISKS AND COMPLICATIONS  What are the risk, side effects and possible complications? Generally speaking, most procedures are safe.  However, with any procedure there are risks, side effects, and the possibility of complications.  The risks and complications are dependent upon the sites that are lesioned, or the type of nerve block to be performed.  The closer  the procedure is to the spine, the more serious the risks are.  Great care is taken when placing the radio frequency needles, block needles or lesioning probes, but sometimes complications can occur. 2. Infection: Any time there is an injection through the skin, there is a risk of infection.  This is why sterile conditions are used for these blocks.  There are four possible types of infection. 1. Localized skin infection. 2. Central Nervous System Infection-This can be in the form of Meningitis, which can be deadly. 3. Epidural Infections-This can be in the form of an epidural abscess, which can cause pressure inside of the spine, causing compression of the spinal cord with subsequent paralysis. This would require an emergency surgery to decompress, and there are no guarantees that the patient would recover from the paralysis. 4. Discitis-This is an infection of the intervertebral discs.  It occurs in about 1% of discography procedures.  It is difficult to treat and it may lead to surgery.        2. Pain: the needles have to go through skin and soft  tissues, will cause soreness.       3. Damage to internal structures:  The nerves to be lesioned may be near blood vessels or    other nerves which can be potentially damaged.       4. Bleeding: Bleeding is more common if the patient is taking blood thinners such as  aspirin, Coumadin, Ticiid, Plavix, etc., or if he/she have some genetic predisposition  such as hemophilia. Bleeding into the spinal canal can cause compression of the spinal  cord with subsequent paralysis.  This would require an emergency surgery to  decompress and there are no guarantees that the patient would recover from the  paralysis.       5. Pneumothorax:  Puncturing of a lung is a possibility, every time a needle is introduced in  the area of the chest or upper back.  Pneumothorax refers to free air around the  collapsed lung(s), inside of the thoracic cavity (chest cavity).  Another two  possible  complications related to a similar event would include: Hemothorax and Chylothorax.   These are variations of the Pneumothorax, where instead of air around the collapsed  lung(s), you may have blood or chyle, respectively.       6. Spinal headaches: They may occur with any procedures in the area of the spine.       7. Persistent CSF (Cerebro-Spinal Fluid) leakage: This is a rare problem, but may occur  with prolonged intrathecal or epidural catheters either due to the formation of a fistulous  track or a dural tear.       8. Nerve damage: By working so close to the spinal cord, there is always a possibility of  nerve damage, which could be as serious as a permanent spinal cord injury with  paralysis.       9. Death:  Although rare, severe deadly allergic reactions known as "Anaphylactic  reaction" can occur to any of the medications used.      10. Worsening of the symptoms:  We can always make thing worse.  What are the chances of something like this happening? Chances of any of this occuring are extremely low.  By statistics, you have more of a chance of getting killed in a motor vehicle accident: while driving to the hospital than any of the above occurring .  Nevertheless, you should be aware that they are possibilities.  In general, it is similar to taking a shower.  Everybody knows that you can slip, hit your head and get killed.  Does that mean that you should not shower again?  Nevertheless always keep in mind that statistics do not mean anything if you happen to be on the wrong side of them.  Even if a procedure has a 1 (one) in a 1,000,000 (million) chance of going wrong, it you happen to be that one..Also, keep in mind that by statistics, you have more of a chance of having something go wrong when taking medications.  Who should not have this procedure? If you are on a blood thinning medication (e.g. Coumadin, Plavix, see list of "Blood Thinners"), or if you have an active infection  going on, you should not have the procedure.  If you are taking any blood thinners, please inform your physician.  How should I prepare for this procedure?  Do not eat or drink anything at least six hours prior to the procedure.  Bring a driver with you .  It cannot be a taxi.  Come  accompanied by an adult that can drive you back, and that is strong enough to help you if your legs get weak or numb from the local anesthetic.  Take all of your medicines the morning of the procedure with just enough water to swallow them.  If you have diabetes, make sure that you are scheduled to have your procedure done first thing in the morning, whenever possible.  If you have diabetes, take only half of your insulin dose and notify our nurse that you have done so as soon as you arrive at the clinic.  If you are diabetic, but only take blood sugar pills (oral hypoglycemic), then do not take them on the morning of your procedure.  You may take them after you have had the procedure.  Do not take aspirin or any aspirin-containing medications, at least eleven (11) days prior to the procedure.  They may prolong bleeding.  Wear loose fitting clothing that may be easy to take off and that you would not mind if it got stained with Betadine or blood.  Do not wear any jewelry or perfume  Remove any nail coloring.  It will interfere with some of our monitoring equipment.  NOTE: Remember that this is not meant to be interpreted as a complete list of all possible complications.  Unforeseen problems may occur.  BLOOD THINNERS The following drugs contain aspirin or other products, which can cause increased bleeding during surgery and should not be taken for 2 weeks prior to and 1 week after surgery.  If you should need take something for relief of minor pain, you may take acetaminophen which is found in Tylenol,m Datril, Anacin-3 and Panadol. It is not blood thinner. The products listed below are.  Do not take any of  the products listed below in addition to any listed on your instruction sheet.  A.P.C or A.P.C with Codeine Codeine Phosphate Capsules #3 Ibuprofen Ridaura  ABC compound Congesprin Imuran rimadil  Advil Cope Indocin Robaxisal  Alka-Seltzer Effervescent Pain Reliever and Antacid Coricidin or Coricidin-D  Indomethacin Rufen  Alka-Seltzer plus Cold Medicine Cosprin Ketoprofen S-A-C Tablets  Anacin Analgesic Tablets or Capsules Coumadin Korlgesic Salflex  Anacin Extra Strength Analgesic tablets or capsules CP-2 Tablets Lanoril Salicylate  Anaprox Cuprimine Capsules Levenox Salocol  Anexsia-D Dalteparin Magan Salsalate  Anodynos Darvon compound Magnesium Salicylate Sine-off  Ansaid Dasin Capsules Magsal Sodium Salicylate  Anturane Depen Capsules Marnal Soma  APF Arthritis pain formula Dewitt's Pills Measurin Stanback  Argesic Dia-Gesic Meclofenamic Sulfinpyrazone  Arthritis Bayer Timed Release Aspirin Diclofenac Meclomen Sulindac  Arthritis pain formula Anacin Dicumarol Medipren Supac  Analgesic (Safety coated) Arthralgen Diffunasal Mefanamic Suprofen  Arthritis Strength Bufferin Dihydrocodeine Mepro Compound Suprol  Arthropan liquid Dopirydamole Methcarbomol with Aspirin Synalgos  ASA tablets/Enseals Disalcid Micrainin Tagament  Ascriptin Doan's Midol Talwin  Ascriptin A/D Dolene Mobidin Tanderil  Ascriptin Extra Strength Dolobid Moblgesic Ticlid  Ascriptin with Codeine Doloprin or Doloprin with Codeine Momentum Tolectin  Asperbuf Duoprin Mono-gesic Trendar  Aspergum Duradyne Motrin or Motrin IB Triminicin  Aspirin plain, buffered or enteric coated Durasal Myochrisine Trigesic  Aspirin Suppositories Easprin Nalfon Trillsate  Aspirin with Codeine Ecotrin Regular or Extra Strength Naprosyn Uracel  Atromid-S Efficin Naproxen Ursinus  Auranofin Capsules Elmiron Neocylate Vanquish  Axotal Emagrin Norgesic Verin  Azathioprine Empirin or Empirin with Codeine Normiflo Vitamin E  Azolid  Emprazil Nuprin Voltaren  Bayer Aspirin plain, buffered or children's or timed BC Tablets or powders Encaprin Orgaran Warfarin Sodium  Buff-a-Comp Enoxaparin Orudis Zorpin  Buff-a-Comp  with Codeine Equegesic Os-Cal-Gesic   Buffaprin Excedrin plain, buffered or Extra Strength Oxalid   Bufferin Arthritis Strength Feldene Oxphenbutazone   Bufferin plain or Extra Strength Feldene Capsules Oxycodone with Aspirin   Bufferin with Codeine Fenoprofen Fenoprofen Pabalate or Pabalate-SF   Buffets II Flogesic Panagesic   Buffinol plain or Extra Strength Florinal or Florinal with Codeine Panwarfarin   Buf-Tabs Flurbiprofen Penicillamine   Butalbital Compound Four-way cold tablets Penicillin   Butazolidin Fragmin Pepto-Bismol   Carbenicillin Geminisyn Percodan   Carna Arthritis Reliever Geopen Persantine   Carprofen Gold's salt Persistin   Chloramphenicol Goody's Phenylbutazone   Chloromycetin Haltrain Piroxlcam   Clmetidine heparin Plaquenil   Cllnoril Hyco-pap Ponstel   Clofibrate Hydroxy chloroquine Propoxyphen         Before stopping any of these medications, be sure to consult the physician who ordered them.  Some, such as Coumadin (Warfarin) are ordered to prevent or treat serious conditions such as "deep thrombosis", "pumonary embolisms", and other heart problems.  The amount of time that you may need off of the medication may also vary with the medication and the reason for which you were taking it.  If you are taking any of these medications, please make sure you notify your pain physician before you undergo any procedures.

## 2016-04-17 ENCOUNTER — Telehealth: Payer: Self-pay | Admitting: *Deleted

## 2016-04-17 NOTE — Telephone Encounter (Signed)
Vomited 7 times last night, better this a.m. Patient advised probably not related to the procedure, advised to taken only liquids or soft foods until nausea relieved. Dr. Laban EmperorNaveira notified.

## 2016-04-18 ENCOUNTER — Encounter: Payer: Self-pay | Admitting: Pain Medicine

## 2016-04-18 ENCOUNTER — Ambulatory Visit: Payer: Medicare Other | Attending: Pain Medicine | Admitting: Pain Medicine

## 2016-04-18 VITALS — BP 111/67 | HR 77 | Temp 97.7°F | Resp 18 | Ht 59.0 in | Wt 123.0 lb

## 2016-04-18 DIAGNOSIS — J449 Chronic obstructive pulmonary disease, unspecified: Secondary | ICD-10-CM | POA: Diagnosis not present

## 2016-04-18 DIAGNOSIS — M7501 Adhesive capsulitis of right shoulder: Secondary | ICD-10-CM | POA: Insufficient documentation

## 2016-04-18 DIAGNOSIS — E7800 Pure hypercholesterolemia, unspecified: Secondary | ICD-10-CM | POA: Diagnosis not present

## 2016-04-18 DIAGNOSIS — S46811S Strain of other muscles, fascia and tendons at shoulder and upper arm level, right arm, sequela: Secondary | ICD-10-CM

## 2016-04-18 DIAGNOSIS — M75100 Unspecified rotator cuff tear or rupture of unspecified shoulder, not specified as traumatic: Secondary | ICD-10-CM | POA: Insufficient documentation

## 2016-04-18 DIAGNOSIS — M7550 Bursitis of unspecified shoulder: Secondary | ICD-10-CM | POA: Insufficient documentation

## 2016-04-18 DIAGNOSIS — M879 Osteonecrosis, unspecified: Secondary | ICD-10-CM | POA: Insufficient documentation

## 2016-04-18 DIAGNOSIS — M797 Fibromyalgia: Secondary | ICD-10-CM | POA: Insufficient documentation

## 2016-04-18 DIAGNOSIS — F419 Anxiety disorder, unspecified: Secondary | ICD-10-CM | POA: Diagnosis not present

## 2016-04-18 DIAGNOSIS — G8911 Acute pain due to trauma: Secondary | ICD-10-CM | POA: Diagnosis not present

## 2016-04-18 DIAGNOSIS — M7551 Bursitis of right shoulder: Secondary | ICD-10-CM

## 2016-04-18 DIAGNOSIS — M545 Low back pain: Secondary | ICD-10-CM | POA: Diagnosis not present

## 2016-04-18 DIAGNOSIS — M47812 Spondylosis without myelopathy or radiculopathy, cervical region: Secondary | ICD-10-CM | POA: Insufficient documentation

## 2016-04-18 DIAGNOSIS — G43909 Migraine, unspecified, not intractable, without status migrainosus: Secondary | ICD-10-CM | POA: Diagnosis not present

## 2016-04-18 DIAGNOSIS — E559 Vitamin D deficiency, unspecified: Secondary | ICD-10-CM | POA: Diagnosis not present

## 2016-04-18 DIAGNOSIS — M79645 Pain in left finger(s): Secondary | ICD-10-CM | POA: Insufficient documentation

## 2016-04-18 DIAGNOSIS — M542 Cervicalgia: Secondary | ICD-10-CM | POA: Diagnosis not present

## 2016-04-18 DIAGNOSIS — M25511 Pain in right shoulder: Secondary | ICD-10-CM | POA: Diagnosis not present

## 2016-04-18 DIAGNOSIS — M778 Other enthesopathies, not elsewhere classified: Secondary | ICD-10-CM | POA: Insufficient documentation

## 2016-04-18 DIAGNOSIS — G8929 Other chronic pain: Secondary | ICD-10-CM | POA: Diagnosis not present

## 2016-04-18 DIAGNOSIS — F17210 Nicotine dependence, cigarettes, uncomplicated: Secondary | ICD-10-CM | POA: Diagnosis not present

## 2016-04-18 DIAGNOSIS — M47816 Spondylosis without myelopathy or radiculopathy, lumbar region: Secondary | ICD-10-CM | POA: Insufficient documentation

## 2016-04-18 DIAGNOSIS — R938 Abnormal findings on diagnostic imaging of other specified body structures: Secondary | ICD-10-CM

## 2016-04-18 DIAGNOSIS — Z79891 Long term (current) use of opiate analgesic: Secondary | ICD-10-CM | POA: Insufficient documentation

## 2016-04-18 DIAGNOSIS — I2510 Atherosclerotic heart disease of native coronary artery without angina pectoris: Secondary | ICD-10-CM | POA: Diagnosis not present

## 2016-04-18 DIAGNOSIS — S46819A Strain of other muscles, fascia and tendons at shoulder and upper arm level, unspecified arm, initial encounter: Secondary | ICD-10-CM | POA: Insufficient documentation

## 2016-04-18 DIAGNOSIS — M25552 Pain in left hip: Secondary | ICD-10-CM | POA: Insufficient documentation

## 2016-04-18 DIAGNOSIS — X58XXXA Exposure to other specified factors, initial encounter: Secondary | ICD-10-CM | POA: Insufficient documentation

## 2016-04-18 DIAGNOSIS — I252 Old myocardial infarction: Secondary | ICD-10-CM | POA: Insufficient documentation

## 2016-04-18 DIAGNOSIS — I10 Essential (primary) hypertension: Secondary | ICD-10-CM | POA: Insufficient documentation

## 2016-04-18 DIAGNOSIS — M79644 Pain in right finger(s): Secondary | ICD-10-CM | POA: Insufficient documentation

## 2016-04-18 DIAGNOSIS — M5481 Occipital neuralgia: Secondary | ICD-10-CM | POA: Insufficient documentation

## 2016-04-18 DIAGNOSIS — M87021 Idiopathic aseptic necrosis of right humerus: Secondary | ICD-10-CM

## 2016-04-18 DIAGNOSIS — G90511 Complex regional pain syndrome I of right upper limb: Secondary | ICD-10-CM | POA: Diagnosis not present

## 2016-04-18 DIAGNOSIS — M7581 Other shoulder lesions, right shoulder: Secondary | ICD-10-CM

## 2016-04-18 DIAGNOSIS — IMO0001 Reserved for inherently not codable concepts without codable children: Secondary | ICD-10-CM

## 2016-04-18 DIAGNOSIS — R936 Abnormal findings on diagnostic imaging of limbs: Secondary | ICD-10-CM | POA: Insufficient documentation

## 2016-04-18 DIAGNOSIS — M67921 Unspecified disorder of synovium and tendon, right upper arm: Secondary | ICD-10-CM | POA: Insufficient documentation

## 2016-04-18 HISTORY — DX: Pain in right shoulder: M25.511

## 2016-04-18 MED ORDER — ORPHENADRINE CITRATE 30 MG/ML IJ SOLN
60.0000 mg | Freq: Once | INTRAMUSCULAR | Status: AC
  Administered 2016-04-18: 60 mg via INTRAMUSCULAR
  Filled 2016-04-18: qty 2

## 2016-04-18 MED ORDER — METHYLPREDNISOLONE 4 MG PO TBPK: ORAL_TABLET | ORAL | Status: DC

## 2016-04-18 MED ORDER — KETOROLAC TROMETHAMINE 60 MG/2ML IM SOLN
60.0000 mg | Freq: Once | INTRAMUSCULAR | Status: AC
  Administered 2016-04-18: 60 mg via INTRAMUSCULAR
  Filled 2016-04-18: qty 2

## 2016-04-18 NOTE — Patient Instructions (Signed)
Suprascapular nerve Block

## 2016-04-18 NOTE — Progress Notes (Signed)
Safety precautions to be maintained throughout the outpatient stay will include: orient to surroundings, keep bed in low position, maintain call bell within reach at all times, provide assistance with transfer out of bed and ambulation.  Patient states Toradol causes GI bleeding. Explained that NSAIDS given IM will not cause GI problems. Also spoke with Dr. Laban EmperorNaveira about this, he agreed.

## 2016-04-18 NOTE — Progress Notes (Signed)
Patient's Name: Michelle Newton  Patient type: Established  MRN: [id number]  Service setting: Ambulatory outpatient  DOB: 01/19/1961  Location: [facility]  DOS: 04/18/2016  Primary Care Physician: Michelle Kern, MD  Note by: Michelle Newton, M.D, DABA, DABAPM, DABPM, DABIPP, FIPP  Referring Physician: Dortha Kern, MD  Specialty: Board-Certified Interventional Pain Management  Last Visit to Pain Management: 04/17/2016   Primary Reason(s) for Visit: Encounter for Acute exacerbation of right shoulder pain secondary to a recent fall where she really injured the shoulder. CC: Shoulder Pain   HPI  [name] is a 55 y.o. year old, female patient, who returns today as an established patient. She has Encounter for therapeutic drug level monitoring; Encounter for long-term use of opiate analgesic; Opiate use (37.5 MME/Day); Right shoulder: Chronic Pain; Cervical facet syndrome; Cervical spondylosis; Chronic neck pain; Chronic left hip pain; Lumbar facet syndrome; Lumbar spondylosis; Failed back surgical syndrome; Chronic low back pain; Fibromyalgia; Chronic pain syndrome; Right shoulder: Disorder; Chronic generalized pain; Moderate COPD (chronic obstructive pulmonary disease) (HCC); Bladder cystocele; H/O acute myocardial infarction; Right shoulder: S/P Rotator cuff repair; Cervical pain; Chronic thumb pain, bilateral; Upper extremity pain; Dorsalgia of occipito-atlanto-axial region; Occipital neuralgia; Smoker; Generalized anxiety disorder; Depression; Wrist arthritis; Fracture five ribs-closed; Chronic obstructive pulmonary disease (COPD) (HCC); History of seizures; History of migraine; H/O non-insulin dependent diabetes mellitus; Essential hypertension; High cholesterol; Coronary artery disease; History of cardiac arrhythmia; H/O vertigo; Vitamin D insufficiency; Chronic radicular cervical pain, left-sided; Chronic fatigue syndrome; Family history of chronic pain; History of  knee surgery; History of elbow surgery; Long term current use of opiate analgesic; Long term prescription opiate use; Opioid dependence, daily use (HCC); Chronic pain; Right shoulder:  Rotator cuff Disorder; CRPS (complex regional pain syndrome), type I, upper (Right); Right shoulder: Acute Pain due to trauma; Abnormal MRI, shoulder (Right); Right shoulder: Adhesive capsulitis; Right shoulder: Torn & retracted Infraspinatus tendon; Right shoulder: Torn & retracted Supraspinatus tendon (Complete, posterior full-thickness tear); Right shoulder: Subscapularis tendinopathy; Right shoulder: Biceps tendinopathy; Right shoulder: Avascular necrosis of humeral head (HCC); and Right shoulder: Subacromial/Subdeltoid bursitis on her problem list.. Her primarily concern today is the Shoulder Pain   Pain Assessment: Self-Reported Pain Score: 9 , clinically she looks like a 5/10 Reported level is inconsistent with clinical obrservations Pain Orientation: Right Pain Descriptors / Indicators: Stabbing Pain Frequency: Constant  The patient comes into the clinics today for post-procedure evaluation on the interventional treatment done on 04/16/2016. The patient showed up to the pain clinic today without an appointment demanding to be seen. On 04/16/2016 we did a diagnostic right-sided stellate ganglion block for her to determine if she indeed had complex regional pain syndrome of the right upper extremity. The patient does not appear to have attained any type of relief from this and in view of it she had an MRI of the right shoulder that has revealed significant pathology since she fell and reinjured the shoulder. In the last year, the patient has ended up in the emergency room several times due to recurrent injuries. This makes me wonder what is going on with her. She may be having an issue of instability or he may be possible that she takes more medication than prescribed thereby losing her balance and increasing her risk  of falls. In fact today, she indicates that she has been taking twice as much medication as she should. She admits that I did not cue her permission to do that which represents  noncompliance with regards to her medication regiment. I have warned her against doing this and today I took a stance about this and informed her that I will not be allowing for her to have early refills.  She is currently seen an orthopedic surgeon for her shoulder and they are evaluating her case to see if she may be a candidate for a shoulder replacement. Today I have taking a look at her recent MRI and although I am not an orthopedic surgeon and my expertise in the field is very limited, it sounds to me that she may need that replacement. In any case, the patient was doing well until she had this fall and therefore this qualifies as acute pain rather than chronic pain. Because of this reason I am not willing to increase her medication regiment especially due to the fact that she has not been compliant with my orders and also because I suspect that this may be the reason why she is having some any falls. I have informed the patient that if her orthopedic surgeon feels it is necessary, I would be okay with him managing the acute phase of this shoulder pain as well as the postop period, should she need a replacement.  Today she comes in indicating that she is interested in me increasing her pain medication dose. I have declined this request. I offered her a prescription for a Medrol Dosepak, but of course she started giving me excuses why she could not or did not want to use steroids. I also went ahead and offer her an IM injection of Toradol 60 mg and Norflex 60 mg to help with the acute phase of the pain. She asked what these were and when she found out that her although was a nonsteroidal anti-inflammatory drug, again she indicated that she has some concerns about this making her colitis worse. However, because it is given IM rather  than by mouth, and because it is a single dose as opposed to a long regiment, I have explained to the patient that I feel he should not aggravate her colitis. She accepted this one would proceeded with the IM injection of both medicines. In addition, I also offered to do a right suprascapular nerve block in an effort to provide her with some relief of the pain and if effective, at least for the duration of local on static, then I informed her that I may be able to apply radiofrequency ablation to that nerve. Several times she asked about what else I was going to do for her, clearly meaning that she was interested in more pain medication. I provided her with as many options as I could short of increasing her pain medication. After having had this asked several times, I had to be very direct and told her that I would not be giving her any more pain medicine.   Laboratory Chemistry  Inflammation Markers Lab Results  Component Value Date   ESRSEDRATE 4 05/26/2014    Renal Function Lab Results  Component Value Date   BUN 11 12/22/2015   CREATININE 0.69 12/22/2015   GFRAA >60 12/22/2015   GFRNONAA >60 12/22/2015    Hepatic Function Lab Results  Component Value Date   AST 21 05/26/2014   ALT 20 05/26/2014   ALBUMIN 3.8 05/26/2014    Electrolytes Lab Results  Component Value Date   NA 138 12/22/2015   K 3.8 12/22/2015   CL 104 12/22/2015   CALCIUM 8.7* 12/22/2015   MG 1.8  05/26/2014    Pain Modulating Vitamins No results found for: VD25OH, [id number], [id number], [id number], VITAMINB12  Coagulation Parameters No results found for: INR, LABPROT  Note: I personally reviewed the above data. Results made available to patient.  Recent Diagnostic Imaging  Mr Shoulder Right Wo Contrast  04/16/2016  CLINICAL DATA:  Patient status post right shoulder surgery 3 months ago. The patient's pain never subsided. No known injury Initial encounter. EXAM: MRI OF THE RIGHT SHOULDER WITHOUT  CONTRAST TECHNIQUE: Multiplanar, multisequence MR imaging of the shoulder was performed. No intravenous contrast was administered. COMPARISON:  None. FINDINGS: Rotator cuff: Four anchors are seen in the humeral head for rotator cuff repair. The infraspinatus tendon is completely torn and retracted to the top of the humeral head, 2-3 cm. There is a large full-thickness tear of the posterior fibers of the supraspinatus measuring approximately 1 cm from front to back with retraction of 2-3 cm. Subscapularis tendinopathy without tear is identified. The teres minor is intact. Muscles: Fatty atrophy of the infraspinatus is seen. Musculature otherwise appears preserved. Biceps long head: Intrasubstance increased T2 signal is seen in the intra-articular segment consistent with severe tendinopathy. The tendon appears to be intact. Acromioclavicular Joint:  The joint has been resected. Glenohumeral Joint: The humeral head is high-riding secondary to the patient's rotator cuff tears. Intermediate increased T2 signal and thickening of the inferior glenohumeral ligament are compatible with adhesive capsulitis. Labrum: The superior labrum is markedly blunted compatible with degeneration or possibly debridement. Bones: The anterior aspect of the acromion measuring 1.7 cm AP by 1.3 cm transverse does not appear attached to the scapula. This could be due to os acromiale or fracture. There is marrow edema in this bone fragment in the adjacent scapula. Avascular necrosis is seen in the superior, lateral margin of the humeral head measuring 1.8 cm transverse by 1.8 cm AP. Fluid is present subacromial/subdeltoid bursa. IMPRESSION: Status post rotator cuff repair. The infraspinatus tendon is completely torn and there is approximately 1 cm full-thickness tear of the posterior supraspinatus with severe tendinopathy seen in the intact supraspinatus. Both tendons are retracted 2-3 cm. There is fatty atrophy of the infraspinatus. Musculature  is otherwise preserved. Severe tendinopathy of the intra-articular long head of biceps. The tendon is difficult to visualize attaching to the superior labrum but is likely intact. The distal of the acromion does not appear to attach to the remainder of the scapula consistent with fracture or less likely the presence of an os acromiale. Marrow edema is seen in this bone fragment and the adjacent acromion. Avascular necrosis superior, lateral humeral head. Findings suggestive of adhesive capsulitis. Status post resection of the acromioclavicular joint and acromioplasty. Subacromial/subdeltoid fluid consistent with bursitis. Electronically Signed   By: Drusilla Kanner M.D.   On: 04/16/2016 12:32   Note: Study reviewed and a copy provided to the patient.  Meds  The patient has a current medication list which includes the following prescription(s): albuterol-ipratropium, aspirin ec, bisoprolol-hydrochlorothiazide, butalbital-acetaminophen-caffeine, vitamin d, cinnamon, cyanocobalamin, cyclobenzaprine, diazepam, dicyclomine, estradiol, fluticasone-salmeterol, ipratropium-albuterol, magnesium, methylprednisolone, nitroglycerin, oxycodone-acetaminophen, oxycodone-acetaminophen, oxycodone-acetaminophen, paroxetine, potassium, probiotic product, promethazine, rosuvastatin, and vitamin e.  Current Outpatient Prescriptions on File Prior to Visit  Medication Sig  . albuterol-ipratropium (COMBIVENT) 18-103 MCG/ACT inhaler Inhale 2 puffs into the lungs every 6 (six) hours as needed for wheezing or shortness of breath.  Marland Kitchen aspirin EC 81 MG tablet Take 81 mg by mouth daily.  . bisoprolol-hydrochlorothiazide (ZIAC) 10-6.25 MG tablet Take 1 tablet by mouth daily.  . butalbital-acetaminophen-caffeine (  FIORICET, ESGIC) 50-325-40 MG tablet Take 1 tablet by mouth every 4 (four) hours as needed for headache.  . Cholecalciferol (VITAMIN D) 2000 UNITS tablet Take 1 tablet (2,000 Units total) by mouth daily.  Marland Kitchen Cinnamon ([employer]  CINNAMON) 500 MG capsule Take 500 mg by mouth daily.  . cyanocobalamin (,VITAMIN B-12,) 1000 MCG/ML injection Every 3 weeks  . cyclobenzaprine (FLEXERIL) 10 MG tablet Take 1 tablet (10 mg total) by mouth 3 (three) times daily.  . diazepam (VALIUM) 5 MG tablet Take 5 mg by mouth every 8 (eight) hours as needed for anxiety.  . dicyclomine (BENTYL) 10 MG capsule take 1 capsule by mouth four times a day if needed for ABDOMINAL SPASM  . estradiol (CLIMARA - DOSED IN MG/24 HR) 0.1 mg/24hr patch Place 0.1 mg onto the skin once a week. On Wednesday  . Fluticasone-Salmeterol (ADVAIR) 100-50 MCG/DOSE AEPB Inhale 1 puff into the lungs 2 (two) times daily.  . Ipratropium-Albuterol (COMBIVENT) 20-100 MCG/ACT AERS respimat Inhale into the lungs.  . Magnesium 400 MG TABS Take 1 tablet by mouth daily.  . nitroGLYCERIN (NITROSTAT) 0.4 MG SL tablet Place 0.4 mg under the tongue every 5 (five) minutes as needed for chest pain.  Marland Kitchen oxyCODONE-acetaminophen (PERCOCET/ROXICET) 5-325 MG tablet Take 1 tablet by mouth 5 (five) times daily as needed for moderate pain or severe pain.  Marland Kitchen oxyCODONE-acetaminophen (PERCOCET/ROXICET) 5-325 MG tablet Take 1 tablet by mouth 5 (five) times daily as needed for moderate pain or severe pain.  Marland Kitchen oxyCODONE-acetaminophen (PERCOCET/ROXICET) 5-325 MG tablet Take 1 tablet by mouth 5 (five) times daily as needed for moderate pain or severe pain.  Marland Kitchen PARoxetine (PAXIL) 30 MG tablet Take 30 mg by mouth daily.  . Potassium 95 MG TABS Take 1 tablet by mouth daily.  . Probiotic Product (PROBIOTIC ADVANCED PO) Take 2 tablets by mouth daily.  . promethazine (PHENERGAN) 25 MG tablet Take 25 mg by mouth every 6 (six) hours as needed for nausea or vomiting.  . rosuvastatin (CRESTOR) 20 MG tablet Take 20 mg by mouth at bedtime.   . vitamin E 1000 UNIT capsule Take 1,000 Units by mouth daily.   No current facility-administered medications on file prior to visit.    ROS  Constitutional: Denies any  fever or chills Gastrointestinal: No reported hemesis, hematochezia, vomiting, or acute GI distress Musculoskeletal: Denies any acute onset joint swelling, redness, loss of ROM, or weakness Neurological: No reported episodes of acute onset apraxia, aphasia, dysarthria, agnosia, amnesia, paralysis, loss of coordination, or loss of consciousness  Allergies  Ms. Miotke is allergic to cymbalta; duloxetine; flagyl; lyrica; vicodin; nsaids; and tape.  PFSH  Medical:  Ms. Liese  has a past medical history of Fibromyalgia; Coronary artery disease; DJD (degenerative joint disease); MI (myocardial infarction) (HCC); Seizures (HCC); Migraine; COPD (chronic obstructive pulmonary disease) (HCC); Asthma; Fibromyalgia; Anxiety; Depression; Hypercholesteremia; Migraine; Vertigo; DJD (degenerative joint disease); Chronic fatigue; Osteoarthritis; CAD (coronary artery disease); H/O acute myocardial infarction (12/14/2014); Chronic thumb pain, bilateral (09/12/2015); Fracture five ribs-closed (09/12/2015); History of seizures (09/12/2015); History of migraine (09/12/2015); H/O non-insulin dependent diabetes mellitus (09/12/2015); Essential hypertension (09/12/2015); History of cardiac arrhythmia (09/12/2015); Family history of chronic pain (09/26/2015); History of knee surgery (09/26/2015); and History of elbow surgery (09/26/2015). Family: family history includes Breast cancer in her cousin and maternal aunt. Surgical:  has past surgical history that includes ureter mesh; Lumbar disc surgery; Rotator cuff repair (Right); Knee Arthroplasty (Left); Abdominal hysterectomy; Trigger finger release (Left); and left elbow surgery. Tobacco:  reports that she has been smoking Cigarettes.  She has been smoking about 0.50 packs per day. She has never used smokeless tobacco. Alcohol:  reports that she does not drink alcohol. Drug:  reports that she does not use illicit drugs.  Constitutional Exam  Vitals: Blood pressure  111/67, pulse 77, temperature 97.7 F (36.5 C), temperature source Oral, resp. rate 18, height 4\' 11"  (1.499 m), weight 123 lb (55.792 kg), SpO2 95 %. General appearance: alert, cooperative, oriented, in moderate distress, well nourished and well hydrated Calculated BMI/Body habitus: Body mass index is 24.83 kg/(m^2). (18.5-24.9 kg/m2) Ideal body weight Psych/Mental status: Alert and oriented x 3 (person, place, & time) Eyes: PERLA Respiratory: No evidence of acute respiratory distress  Cervical Spine Exam  Inspection: No masses, redness, or swelling Alignment: Symmetrical ROM: Functional: ROM is within functional limits [facility]) Stability: No instability detected Muscle strength & Tone: Functionally intact Sensory: Unimpaired Palpation: No complaints of tenderness  Upper Extremity (UE) Exam    Side: Right upper extremity  Side: Left upper extremity  Inspection: No masses, redness, swelling, or asymmetry  Inspection: No masses, redness, swelling, or asymmetry  ROM:  ROM:  Functional: Limited ROM over the right shoulder   Functional: ROM is within functional limits [facility])  Muscle strength & Tone: Guarding  Muscle strength & Tone: Functionally intact  Sensory: Pain pattern appears Articular  Sensory: Unimpaired  Palpation: Tender  Palpation: Non-contributory   Thoracic Spine Exam  Inspection: No masses, redness, or swelling Alignment: Symmetrical ROM: Functional: ROM is within functional limits [facility]) Stability: No instability detected Sensory: Unimpaired Muscle strength & Tone: Functionally intact Palpation: No complaints of tenderness  Lumbar Spine Exam  Inspection: No masses, redness, or swelling Alignment: Symmetrical ROM: Functional: ROM is within functional limits [facility]) Stability: No instability detected Muscle strength & Tone: Functionally intact Sensory: Unimpaired Palpation: No complaints of tenderness Provocative Tests: Lumbar Hyperextension and rotation test:  deferred Patrick's Maneuver: deferred  Gait & Posture Assessment  Ambulation: Unassisted Gait: Unaffected Posture: WNL  Lower Extremity Exam    Side: Right lower extremity  Side: Left lower extremity  Inspection: No masses, redness, swelling, or asymmetry ROM:  Inspection: No masses, redness, swelling, or asymmetry ROM:  Functional: ROM is within functional limits [facility])  Functional: ROM is within functional limits [facility])  Muscle strength & Tone: Functionally intact  Muscle strength & Tone: Functionally intact  Sensory: Unimpaired  Sensory: Unimpaired  Palpation: Non-contributory  Palpation: Non-contributory   Assessment & Plan  Primary Diagnosis & Pertinent Problem List: The primary encounter diagnosis was Acute pain of right shoulder due to trauma. Diagnoses of Abnormal MRI, shoulder (Right), Right shoulder Adhesive capsulitis, Infraspinatus tendon tear, right, sequela, Supraspinatus tendon tear, right, sequela, Right shoulder: Subscapularis tendinopathy, Right shoulder: Biceps tendinopathy, Right shoulder: Avascular necrosis of humeral head (HCC), and Subacromial or subdeltoid bursitis, right were also pertinent to this visit.  Visit Diagnosis: 1. Acute pain of right shoulder due to trauma   2. Abnormal MRI, shoulder (Right)   3. Right shoulder Adhesive capsulitis   4. Infraspinatus tendon tear, right, sequela   5. Supraspinatus tendon tear, right, sequela   6. Right shoulder: Subscapularis tendinopathy   7. Right shoulder: Biceps tendinopathy   8. Right shoulder: Avascular necrosis of humeral head (HCC)   9. Subacromial or subdeltoid bursitis, right     Problem-specific Plan(s): No problem-specific assessment & plan notes found for this encounter.   Plan of Care   Problem List Items Addressed This Visit  High   Abnormal MRI, shoulder (Right)   Right shoulder: Acute Pain due to trauma - Primary   Relevant Medications   orphenadrine (NORFLEX) injection 60 mg  (Completed)   ketorolac (TORADOL) injection 60 mg (Completed)   methylPREDNISolone (MEDROL) 4 MG TBPK tablet   Other Relevant Orders   SUPRASCAPULAR NERVE BLOCK   Right shoulder: Adhesive capsulitis   Relevant Medications   orphenadrine (NORFLEX) injection 60 mg (Completed)   ketorolac (TORADOL) injection 60 mg (Completed)   methylPREDNISolone (MEDROL) 4 MG TBPK tablet   Right shoulder: Avascular necrosis of humeral head (HCC)   Right shoulder: Biceps tendinopathy   Right shoulder: Subacromial/Subdeltoid bursitis   Right shoulder: Subscapularis tendinopathy   Relevant Medications   orphenadrine (NORFLEX) injection 60 mg (Completed)   ketorolac (TORADOL) injection 60 mg (Completed)   methylPREDNISolone (MEDROL) 4 MG TBPK tablet   Right shoulder: Torn & retracted Infraspinatus tendon   Right shoulder: Torn & retracted Supraspinatus tendon (Complete, posterior full-thickness tear)       Pharmacotherapy (Medications Ordered): Meds ordered this encounter  Medications  . orphenadrine (NORFLEX) injection 60 mg    Sig:   . ketorolac (TORADOL) injection 60 mg    Sig:   . methylPREDNISolone (MEDROL) 4 MG TBPK tablet    Sig: Follow package instructions.    Dispense:  21 tablet    Refill:  0    Do not place this medication, or any other prescription from our practice, on "Automatic Refill". Patient may have prescription filled one day early if pharmacy is closed on scheduled refill date.    Lab-work & Procedure Ordered: Orders Placed This Encounter  Procedures  . SUPRASCAPULAR NERVE BLOCK    Standing Status: Future     Number of Occurrences:      Standing Expiration Date: 04/18/2017    Scheduling Instructions:     Side: Right-sided     Sedation: With Sedation.     Timeframe: ASAP    Order Specific Question:  Where will this procedure be performed?    Answer:  [facility]    Imaging Ordered: None  Interventional Therapies: Scheduled:  Right suprascapular nerve block  under fluoroscopic guidance and IV sedation.    Considering:  Possible right suprascapular nerve radiofrequency ablation.   PRN Procedures:  None at this time.    Referral(s) or Consult(s): None at this time.  Medications administered during this visit: We administered orphenadrine and ketorolac.  Requested PM Follow-up: Return for Keep previously scheduled appointment., Procedure (Scheduled).  Future Appointments Date Time Provider Department Center  04/25/2016 9:15 AM Delano Metz, MD [facility] None  04/30/2016 8:45 AM Delano Metz, MD [facility] None  06/17/2016 7:40 AM Delano Metz, MD [facility] None    Primary Care Physician: Michelle Kern, MD Location: [facility] Note by: Michelle Newton, M.D, DABA, DABAPM, DABPM, DABIPP, FIPP  Pain Score Disclaimer: We use the NRS-11 scale. This is a self-reported, subjective measurement of pain severity with only modest accuracy. It is used primarily to identify changes within a particular patient. It must be understood that outpatient pain scales are significantly less accurate that those used for research, where they can be applied under ideal controlled circumstances with minimal exposure to variables. In reality, the score is likely to be a combination of pain intensity and pain affect, where pain affect describes the degree of emotional arousal or changes in action readiness caused by the sensory experience of pain. Factors such as social and work situation,  setting, emotional state, anxiety levels, expectation, and prior pain experience may influence pain perception and show large inter-individual differences that may also be affected by time variables.  Patient instructions provided during this appointment: Patient Instructions  Suprascapular nerve Block

## 2016-04-25 ENCOUNTER — Telehealth: Payer: Self-pay | Admitting: *Deleted

## 2016-04-25 ENCOUNTER — Ambulatory Visit: Payer: Medicare Other | Admitting: Pain Medicine

## 2016-04-25 NOTE — Telephone Encounter (Signed)
pt called stating that she's sick

## 2016-04-30 ENCOUNTER — Ambulatory Visit: Payer: Medicare Other | Admitting: Pain Medicine

## 2016-05-09 ENCOUNTER — Telehealth: Payer: Self-pay | Admitting: Pain Medicine

## 2016-05-09 DIAGNOSIS — S42123A Displaced fracture of acromial process, unspecified shoulder, initial encounter for closed fracture: Secondary | ICD-10-CM | POA: Insufficient documentation

## 2016-05-09 NOTE — Telephone Encounter (Signed)
Patient states she is going to have surgery on her shoulder there is a lot of damage, they gave her a script of Tylenol 3 to help with pain at night , her doctor is supposed to call

## 2016-06-05 ENCOUNTER — Ambulatory Visit: Payer: Medicare Other | Attending: Pain Medicine | Admitting: Pain Medicine

## 2016-06-05 ENCOUNTER — Encounter: Payer: Self-pay | Admitting: Pain Medicine

## 2016-06-05 VITALS — BP 155/95 | HR 75 | Temp 97.0°F | Resp 16 | Ht 59.0 in | Wt 120.0 lb

## 2016-06-05 DIAGNOSIS — G8929 Other chronic pain: Secondary | ICD-10-CM | POA: Insufficient documentation

## 2016-06-05 DIAGNOSIS — F17210 Nicotine dependence, cigarettes, uncomplicated: Secondary | ICD-10-CM | POA: Insufficient documentation

## 2016-06-05 DIAGNOSIS — Z981 Arthrodesis status: Secondary | ICD-10-CM | POA: Insufficient documentation

## 2016-06-05 DIAGNOSIS — M5382 Other specified dorsopathies, cervical region: Secondary | ICD-10-CM

## 2016-06-05 DIAGNOSIS — M549 Dorsalgia, unspecified: Secondary | ICD-10-CM | POA: Insufficient documentation

## 2016-06-05 DIAGNOSIS — E559 Vitamin D deficiency, unspecified: Secondary | ICD-10-CM | POA: Diagnosis not present

## 2016-06-05 DIAGNOSIS — Z5181 Encounter for therapeutic drug level monitoring: Secondary | ICD-10-CM

## 2016-06-05 DIAGNOSIS — R202 Paresthesia of skin: Secondary | ICD-10-CM | POA: Insufficient documentation

## 2016-06-05 DIAGNOSIS — M542 Cervicalgia: Secondary | ICD-10-CM | POA: Insufficient documentation

## 2016-06-05 DIAGNOSIS — R209 Unspecified disturbances of skin sensation: Secondary | ICD-10-CM | POA: Insufficient documentation

## 2016-06-05 DIAGNOSIS — E119 Type 2 diabetes mellitus without complications: Secondary | ICD-10-CM | POA: Diagnosis not present

## 2016-06-05 DIAGNOSIS — M79646 Pain in unspecified finger(s): Secondary | ICD-10-CM

## 2016-06-05 DIAGNOSIS — R52 Pain, unspecified: Secondary | ICD-10-CM | POA: Diagnosis present

## 2016-06-05 DIAGNOSIS — G43909 Migraine, unspecified, not intractable, without status migrainosus: Secondary | ICD-10-CM | POA: Diagnosis not present

## 2016-06-05 DIAGNOSIS — M79645 Pain in left finger(s): Secondary | ICD-10-CM | POA: Diagnosis not present

## 2016-06-05 DIAGNOSIS — M79644 Pain in right finger(s): Secondary | ICD-10-CM | POA: Diagnosis not present

## 2016-06-05 DIAGNOSIS — M5127 Other intervertebral disc displacement, lumbosacral region: Secondary | ICD-10-CM | POA: Insufficient documentation

## 2016-06-05 DIAGNOSIS — E7800 Pure hypercholesterolemia, unspecified: Secondary | ICD-10-CM | POA: Insufficient documentation

## 2016-06-05 DIAGNOSIS — J449 Chronic obstructive pulmonary disease, unspecified: Secondary | ICD-10-CM | POA: Insufficient documentation

## 2016-06-05 DIAGNOSIS — M25511 Pain in right shoulder: Secondary | ICD-10-CM | POA: Insufficient documentation

## 2016-06-05 DIAGNOSIS — M4726 Other spondylosis with radiculopathy, lumbar region: Secondary | ICD-10-CM

## 2016-06-05 DIAGNOSIS — M5481 Occipital neuralgia: Secondary | ICD-10-CM | POA: Diagnosis not present

## 2016-06-05 DIAGNOSIS — Z79891 Long term (current) use of opiate analgesic: Secondary | ICD-10-CM | POA: Insufficient documentation

## 2016-06-05 DIAGNOSIS — Z9889 Other specified postprocedural states: Secondary | ICD-10-CM | POA: Insufficient documentation

## 2016-06-05 DIAGNOSIS — S42123A Displaced fracture of acromial process, unspecified shoulder, initial encounter for closed fracture: Secondary | ICD-10-CM | POA: Diagnosis not present

## 2016-06-05 DIAGNOSIS — I10 Essential (primary) hypertension: Secondary | ICD-10-CM | POA: Diagnosis not present

## 2016-06-05 DIAGNOSIS — M47812 Spondylosis without myelopathy or radiculopathy, cervical region: Secondary | ICD-10-CM

## 2016-06-05 DIAGNOSIS — M545 Low back pain: Secondary | ICD-10-CM

## 2016-06-05 DIAGNOSIS — M47816 Spondylosis without myelopathy or radiculopathy, lumbar region: Secondary | ICD-10-CM | POA: Diagnosis not present

## 2016-06-05 DIAGNOSIS — M25552 Pain in left hip: Secondary | ICD-10-CM | POA: Insufficient documentation

## 2016-06-05 DIAGNOSIS — M5489 Other dorsalgia: Secondary | ICD-10-CM

## 2016-06-05 DIAGNOSIS — M797 Fibromyalgia: Secondary | ICD-10-CM | POA: Insufficient documentation

## 2016-06-05 DIAGNOSIS — X58XXXA Exposure to other specified factors, initial encounter: Secondary | ICD-10-CM | POA: Diagnosis not present

## 2016-06-05 DIAGNOSIS — N8110 Cystocele, unspecified: Secondary | ICD-10-CM | POA: Diagnosis not present

## 2016-06-05 DIAGNOSIS — Z7982 Long term (current) use of aspirin: Secondary | ICD-10-CM | POA: Insufficient documentation

## 2016-06-05 DIAGNOSIS — I252 Old myocardial infarction: Secondary | ICD-10-CM | POA: Diagnosis not present

## 2016-06-05 DIAGNOSIS — G90511 Complex regional pain syndrome I of right upper limb: Secondary | ICD-10-CM | POA: Diagnosis not present

## 2016-06-05 DIAGNOSIS — M5412 Radiculopathy, cervical region: Secondary | ICD-10-CM

## 2016-06-05 MED ORDER — OXYCODONE-ACETAMINOPHEN 5-325 MG PO TABS: 1.0000 | ORAL_TABLET | Freq: Every day | ORAL | Status: DC | PRN

## 2016-06-05 MED ORDER — CYCLOBENZAPRINE HCL 10 MG PO TABS
10.0000 mg | ORAL_TABLET | Freq: Three times a day (TID) | ORAL | Status: DC
Start: 2016-06-05 — End: 2016-08-19

## 2016-06-05 NOTE — Progress Notes (Signed)
Safety precautions to be maintained throughout the outpatient stay will include: orient to surroundings, keep bed in low position, maintain call bell within reach at all times, provide assistance with transfer out of bed and ambulation.  Percocet 5/325 mg #33 out of 150 remaining. Filled 05-20-16. Given Percocet 10/325mg  for post op pain by Dr. Lenore CordiaLouis Almekinders [phone] [id number]

## 2016-06-05 NOTE — Patient Instructions (Signed)
You were given 3 prescriptions for Oxycodone today. 

## 2016-06-05 NOTE — Progress Notes (Signed)
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 (PHENERGAN) 25 MG tablet Take 25 mg by mouth every 6 (six) hours as needed for nausea or vomiting.  . rosuvastatin (CRESTOR) 20 MG tablet Take 20 mg by mouth at bedtime.   . vitamin E 1000 UNIT capsule Take 1,000 Units by mouth daily.   No current facility-administered medications on file prior to visit.    ROS  Constitutional: Denies any fever or chills Gastrointestinal: No reported hemesis, hematochezia, vomiting, or acute GI distress Musculoskeletal: Denies any acute onset joint swelling, redness, loss of ROM, or weakness Neurological: No reported episodes of acute onset apraxia, aphasia, dysarthria, agnosia, amnesia, paralysis, loss of coordination, or loss of consciousness  Allergies  Ms. Tullos is allergic to cymbalta; duloxetine; flagyl; lyrica; vicodin; nsaids; and tape.  PFSH  Medical:  Ms. Milbrath  has a past  medical history of Fibromyalgia; Coronary artery disease; DJD (degenerative joint disease); MI (myocardial infarction) (HCC); Seizures (HCC); Migraine; COPD (chronic obstructive pulmonary disease) (HCC); Asthma; Fibromyalgia; Anxiety; Depression; Hypercholesteremia; Migraine; Vertigo; DJD (degenerative joint disease); Chronic fatigue; Osteoarthritis; CAD (coronary artery disease); H/O acute myocardial infarction (12/14/2014);  Chronic thumb pain, bilateral (09/12/2015); Fracture five ribs-closed (09/12/2015); History of seizures (09/12/2015); History of migraine (09/12/2015); H/O non-insulin dependent diabetes mellitus (09/12/2015); Essential hypertension (09/12/2015); History of cardiac arrhythmia (09/12/2015); Family history of chronic pain (09/26/2015); History of knee surgery (09/26/2015); and History of elbow surgery (09/26/2015). Family: family history includes Breast cancer in her cousin and maternal aunt. Surgical:  has past surgical history that includes ureter mesh; Lumbar disc surgery; Rotator cuff repair (Right); Knee Arthroplasty (Left); Abdominal hysterectomy; Trigger finger release (Left); and left elbow surgery. Tobacco:  reports that she has been smoking Cigarettes.  She has been smoking about 0.50 packs per day. She has never used smokeless tobacco. Alcohol:  reports that she does not drink alcohol. Drug:  reports that she does not use illicit drugs.  Constitutional Exam  Vitals: Blood pressure 155/95, pulse 75, temperature 97 F (36.1 C), temperature source Oral, resp. rate 16, height  (1.499 m), weight 120 lb (54.432 kg), SpO2 96 %. General appearance: Well nourished, well developed, and well hydrated. In no acute distress Calculated BMI/Body habitus: Body mass index is 24.22 kg/(m^2). (18.5-24.9 kg/m2) Ideal body weight Psych/Mental status: Alert and oriented x 3 (person, place, & time) Eyes: PERLA Respiratory: No evidence of acute respiratory distress  Cervical Spine Exam   Inspection: No masses, redness, or swelling Alignment: Symmetrical ROM: Functional: ROM is within functional limits [facility]) Stability: No instability detected Muscle strength & Tone: Functionally intact Sensory: Unimpaired Palpation: No complaints of tenderness  Upper Extremity (UE) Exam    Side: Right upper extremity  Side: Left upper extremity  Inspection: No masses, redness, swelling, or asymmetry. There is a bandage over the right shoulder from the recent surgery and she has her right upper extremity in a sling.   Inspection: No masses, redness, swelling, or asymmetry  ROM:  ROM:  Functional: Limited ROM Due to shoulder arthropathy and recent surgery.   Functional: ROM is within functional limits [facility])        Muscle strength & Tone: Functionally intact  Muscle strength & Tone: Functionally intact  Sensory: Movement-associated pain  Sensory: Unimpaired  Palpation: Area tender to palpation  Palpation: No complaints of tenderness   Thoracic Spine Exam  Inspection: No masses, redness, or swelling Alignment: Symmetrical ROM: Functional: ROM is within functional limits [facility]) Stability: No instability detected Sensory: Unimpaired Muscle strength & Tone: Functionally intact Palpation: No complaints of tenderness  Lumbar Spine Exam  Inspection: No masses, redness, or swelling Alignment: Symmetrical ROM: Functional: ROM is within functional limits [facility]) Stability: No instability detected Muscle strength & Tone: Functionally intact Sensory: Unimpaired Palpation: No complaints of tenderness Provocative Tests: Lumbar Hyperextension and rotation test: deferred       Patrick's Maneuver: deferred              Gait & Posture Assessment  Ambulation: Unassisted Gait: Unaffected Posture: WNL   Lower Extremity Exam    Side: Right lower extremity  Side: Left lower extremity  Inspection: No masses, redness, swelling, or asymmetry ROM:  Inspection: No masses, redness, swelling, or  asymmetry ROM:  Functional: ROM is within functional limits [facility])        Functional: ROM is within functional limits [facility])        Muscle strength & Tone: Functionally intact  Muscle strength & Tone: Functionally intact  Sensory: Unimpaired  Sensory: Unimpaired  Palpation: No complaints of tenderness  Palpation: No complaints of tenderness   Assessment & Plan  Primary Diagnosis & Pertinent Problem List: The  primary encounter diagnosis was Chronic pain. Diagnoses of Encounter for therapeutic drug level monitoring, Long term current use of opiate analgesic, Disturbance of skin sensation, S/P shoulder surgery (05/24/2016) (Right), Cervical facet syndrome, Chronic hip pain (Left), Chronic low back pain, Chronic neck pain, Chronic cervical radicular pain (Left), Chronic thumb pain (Bilateral), Dorsalgia of occipito-atlanto-axial region (Bilateral), Lumbar facet syndrome, Lumbar spondylosis, Bilateral occipital neuralgia, Right shoulder: Chronic Pain, Vitamin D insufficiency, and Fibromyalgia were also pertinent to this visit.  Visit Diagnosis: 1. Chronic pain   2. Encounter for therapeutic drug level monitoring   3. Long term current use of opiate analgesic   4. Disturbance of skin sensation   5. S/P shoulder surgery (05/24/2016) (Right)   6. Cervical facet syndrome   7. Chronic hip pain (Left)   8. Chronic low back pain   9. Chronic neck pain   10. Chronic cervical radicular pain (Left)   11. Chronic thumb pain (Bilateral)   12. Dorsalgia of occipito-atlanto-axial region (Bilateral)   13. Lumbar facet syndrome   14. Lumbar spondylosis   15. Bilateral occipital neuralgia   16. Right shoulder: Chronic Pain   17. Vitamin D insufficiency   18. Fibromyalgia     Problems updated and reviewed during this visit: Problem  S/P shoulder surgery (05/24/2016) (Right)  Chronic cervical radicular pain (Left)  Chronic hip pain (Left)  Chronic thumb pain (Bilateral)  Dorsalgia of  occipito-atlanto-axial region (Bilateral)  Occipital neuralgia (Bilateral)  Disturbance of Skin Sensation  Acromial Fracture    Problem-specific Plan(s): No problem-specific assessment & plan notes found for this encounter.  No new assessment & plan notes have been filed under this hospital service since the last note was generated. Service: Pain Management   Plan of Care   Problem List Items Addressed This Visit      High   Cervical facet syndrome (Chronic)   Relevant Orders   CERVICAL FACET (MEDIAL BRANCH NERVE BLOCK)    Chronic cervical radicular pain (Left) (Chronic)   Relevant Medications   cyclobenzaprine (FLEXERIL) 10 MG tablet   Other Relevant Orders   CERVICAL EPIDURAL STEROID INJECTION   Chronic hip pain (Left) (Chronic)   Relevant Orders   HIP INJECTION   Chronic low back pain (Chronic)   Relevant Medications   oxyCODONE-acetaminophen (PERCOCET/ROXICET) 5-325 MG tablet   oxyCODONE-acetaminophen (PERCOCET/ROXICET) 5-325 MG tablet   oxyCODONE-acetaminophen (PERCOCET/ROXICET) 5-325 MG tablet   cyclobenzaprine (FLEXERIL) 10 MG tablet   Other Relevant Orders   LUMBAR FACET(MEDIAL BRANCH NERVE BLOCK) MBNB   LUMBAR EPIDURAL STEROID INJECTION   Chronic neck pain (Chronic)   Relevant Medications   oxyCODONE-acetaminophen (PERCOCET/ROXICET) 5-325 MG tablet   oxyCODONE-acetaminophen (PERCOCET/ROXICET) 5-325 MG tablet   oxyCODONE-acetaminophen (PERCOCET/ROXICET) 5-325 MG tablet   cyclobenzaprine (FLEXERIL) 10 MG tablet   Other Relevant Orders   CERVICAL EPIDURAL STEROID INJECTION   CERVICAL FACET (MEDIAL BRANCH NERVE BLOCK)    Chronic pain - Primary (Chronic)   Relevant Medications   oxyCODONE-acetaminophen (PERCOCET/ROXICET) 5-325 MG tablet   oxyCODONE-acetaminophen (PERCOCET/ROXICET) 5-325 MG tablet   oxyCODONE-acetaminophen (PERCOCET/ROXICET) 5-325 MG tablet   cyclobenzaprine (FLEXERIL) 10 MG tablet   Other Relevant Orders   Comprehensive metabolic panel    C-reactive protein   Magnesium   Sedimentation rate   25-Hydroxyvitamin D Lcms D2+D3   Chronic thumb pain (Bilateral) (Chronic)   Relevant Medications   oxyCODONE-acetaminophen (PERCOCET/ROXICET) 5-325 MG tablet   oxyCODONE-acetaminophen (PERCOCET/ROXICET) 5-325 MG tablet   oxyCODONE-acetaminophen (PERCOCET/ROXICET) 5-325 MG tablet   cyclobenzaprine (FLEXERIL) 10 MG tablet  Other Relevant Orders   GREATER OCCIPITAL NERVE BLOCK   TRIGGER POINT INJECTION   Dorsalgia of occipito-atlanto-axial region (Bilateral) (Chronic)   Relevant Orders   GREATER OCCIPITAL NERVE BLOCK   Fibromyalgia (Chronic)   Relevant Medications   cyclobenzaprine (FLEXERIL) 10 MG tablet   Lumbar facet syndrome (Chronic)   Relevant Medications   oxyCODONE-acetaminophen (PERCOCET/ROXICET) 5-325 MG tablet   oxyCODONE-acetaminophen (PERCOCET/ROXICET) 5-325 MG tablet   oxyCODONE-acetaminophen (PERCOCET/ROXICET) 5-325 MG tablet   cyclobenzaprine (FLEXERIL) 10 MG tablet   Other Relevant Orders   LUMBAR FACET(MEDIAL BRANCH NERVE BLOCK) MBNB   Lumbar spondylosis (Chronic)   Relevant Medications   oxyCODONE-acetaminophen (PERCOCET/ROXICET) 5-325 MG tablet   oxyCODONE-acetaminophen (PERCOCET/ROXICET) 5-325 MG tablet   oxyCODONE-acetaminophen (PERCOCET/ROXICET) 5-325 MG tablet   cyclobenzaprine (FLEXERIL) 10 MG tablet   Other Relevant Orders   LUMBAR FACET(MEDIAL BRANCH NERVE BLOCK) MBNB   LUMBAR EPIDURAL STEROID INJECTION   Occipital neuralgia (Bilateral) (Chronic)   Relevant Medications   oxyCODONE-acetaminophen (PERCOCET/ROXICET) 5-325 MG tablet   oxyCODONE-acetaminophen (PERCOCET/ROXICET) 5-325 MG tablet   oxyCODONE-acetaminophen (PERCOCET/ROXICET) 5-325 MG tablet   cyclobenzaprine (FLEXERIL) 10 MG tablet   Other Relevant Orders   GREATER OCCIPITAL NERVE BLOCK   Right shoulder: Chronic Pain (Chronic)   Relevant Orders   SUPRASCAPULAR NERVE BLOCK   S/P shoulder surgery (05/24/2016) (Right) (Chronic)      Medium   Encounter for therapeutic drug level monitoring   Long term current use of opiate analgesic (Chronic)   Relevant Orders   ToxASSURE Select 13 (MW), Urine     Low   Disturbance of skin sensation   Relevant Orders   Vitamin B12   Vitamin D insufficiency       Pharmacotherapy (Medications Ordered): Meds ordered this encounter  Medications  . oxyCODONE-acetaminophen (PERCOCET/ROXICET) 5-325 MG tablet    Sig: Take 1 tablet by mouth 5 (five) times daily as needed for severe pain.    Dispense:  150 tablet    Refill:  0    Do not place this medication, or any other prescription from our practice, on "Automatic Refill". Patient may have prescription filled one day early if pharmacy is closed on scheduled refill date. Do not fill until: 06/19/16 To last until: 07/19/16  . oxyCODONE-acetaminophen (PERCOCET/ROXICET) 5-325 MG tablet    Sig: Take 1 tablet by mouth 5 (five) times daily as needed for severe pain.    Dispense:  150 tablet    Refill:  0    Do not place this medication, or any other prescription from our practice, on "Automatic Refill". Patient may have prescription filled one day early if pharmacy is closed on scheduled refill date. Do not fill until: 07/19/16 To last until: 08/18/16  . oxyCODONE-acetaminophen (PERCOCET/ROXICET) 5-325 MG tablet    Sig: Take 1 tablet by mouth 5 (five) times daily as needed for severe pain.    Dispense:  150 tablet    Refill:  0    Do not place this medication, or any other prescription from our practice, on "Automatic Refill". Patient may have prescription filled one day early if pharmacy is closed on scheduled refill date. Do not fill until: 08/18/16 To last until: 09/17/16  . cyclobenzaprine (FLEXERIL) 10 MG tablet    Sig: Take 1 tablet (10 mg total) by mouth 3 (three) times daily.    Dispense:  90 tablet    Refill:  2    Do not place this medication, or any other prescription from our practice, on "Automatic Refill". Patient may  have prescription filled one day early if pharmacy is closed on scheduled refill date.    Lab-work & Procedure Ordered: Orders Placed This Encounter  Procedures  . CERVICAL EPIDURAL STEROID INJECTION  . CERVICAL FACET (MEDIAL BRANCH NERVE BLOCK)   . HIP INJECTION  . LUMBAR FACET(MEDIAL BRANCH NERVE BLOCK) MBNB  . LUMBAR EPIDURAL STEROID INJECTION  . SUPRASCAPULAR NERVE BLOCK  . GREATER OCCIPITAL NERVE BLOCK  . TRIGGER POINT INJECTION  . ToxASSURE Select 13 (MW), Urine  . Comprehensive metabolic panel  . C-reactive protein  . Magnesium  . Sedimentation rate  . Vitamin B12  . 25-Hydroxyvitamin D Lcms D2+D3    Imaging Ordered: None  Interventional Therapies: Scheduled:  None at this time.    Considering:   1. Diagnostic bilateral cervical facet block under fluoroscopic guidance and IV sedation.  2. Possible bilateral cervical facet radiofrequency ablation under fluoroscopic guidance and IV sedation.  3. Diagnostic left-sided cervical epidural steroid injection under fluoroscopic guidance, with or without sedation.  4. Diagnostic left intra-articular hip joint injection under fluoroscopic guidance, with or without sedation.  5. Possible left hip joint radiofrequency ablation under fluoroscopic guidance and IV sedation.  6. Diagnostic bilateral lumbar facet block under fluoroscopic guidance and IV sedation.  7. Possible bilateral lumbar facet radiofrequency ablation under fluoroscopic guidance and IV sedation.  8. Diagnostic caudal epidural steroid injection under fluoroscopic guidance, with or without IV sedation.  9. Possible Racz epidural lysis of adhesions under fluoroscopic guidance and IV sedation.  10. Diagnostic right suprascapular nerve block under fluoroscopic guidance, with or without IV sedation.  11. Possible right suprascapular radiofrequency ablation under fluoroscopic guidance and IV sedation.  12. Diagnostic bilateral greater occipital nerve block under  fluoroscopic guidance, with or without sedation.  13. Possible bilateral greater occipital nerve radiofrequency ablation under fluoroscopic guidance and IV sedation.  14. Palliative bilateral thumb trigger point injection, without fluoroscopy IV sedation.    PRN Procedures:   1. Diagnostic bilateral cervical facet block under fluoroscopic guidance and IV sedation.  2. Diagnostic left-sided cervical epidural steroid injection under fluoroscopic guidance, with or without sedation.  3. Diagnostic left intra-articular hip joint injection under fluoroscopic guidance, with or without sedation.  4. Diagnostic bilateral lumbar facet block under fluoroscopic guidance and IV sedation.  5. Diagnostic caudal epidural steroid injection under fluoroscopic guidance, with or without IV sedation.  6. Diagnostic right suprascapular nerve block under fluoroscopic guidance, with or without IV sedation.  7. Diagnostic bilateral greater occipital nerve block under fluoroscopic guidance, with or without sedation.  8. Palliative bilateral thumb trigger point injection, without fluoroscopy IV sedation.    Referral(s) or Consult(s): None at this time.  New Prescriptions   No medications on file    Medications administered during this visit: [name] had no medications administered during this visit.  Requested PM Follow-up: Return in 3 months (on 09/02/2016) for (3-Mo), Med-Mgmt, (PRN) Procedure.  Future Appointments Date Time Provider Department Center  09/02/2016 9:20 AM Delano MetzFrancisco Elmarie Devlin, MD [facility] None    Primary Care Physician: Dortha KernBLISS, LAURA K, MD [facility] Note by: Sydnee LevansFrancisco A. Laban EmperorNaveira, M.D, DABA, DABAPM, DABPM, DABIPP, FIPP  Pain Score Disclaimer: We use the NRS-11 scale. This is a self-reported, subjective measurement of pain severity with only modest accuracy. It is used primarily to identify changes within a particular patient. It must be understood that  outpatient pain scales are significantly less accurate that those used for research, where they can be applied under ideal controlled  circumstances with minimal exposure to variables. In reality, the score is likely to be a combination of pain intensity and pain affect, where pain affect describes the degree of emotional arousal or changes in action readiness caused by the sensory experience of pain. Factors such as social and work situation, setting, emotional state, anxiety levels, expectation, and prior pain experience may influence pain perception and show large inter-individual differences that may also be affected by time variables.  Patient instructions provided during this appointment: Patient Instructions  You were given 3 prescriptions for Oxycodone today.

## 2016-06-06 ENCOUNTER — Other Ambulatory Visit
Admission: RE | Admit: 2016-06-06 | Discharge: 2016-06-06 | Disposition: A | Discharge status: Home / Self Care | Payer: Medicare Other | Source: Ambulatory Visit | Attending: Pain Medicine | Admitting: Pain Medicine

## 2016-06-06 DIAGNOSIS — R209 Unspecified disturbances of skin sensation: Secondary | ICD-10-CM | POA: Insufficient documentation

## 2016-06-06 DIAGNOSIS — G8929 Other chronic pain: Secondary | ICD-10-CM | POA: Diagnosis present

## 2016-06-06 LAB — COMPREHENSIVE METABOLIC PANEL
ALBUMIN: 3.7 g/dL (ref 3.5–5.0)
ALT: 17 U/L (ref 14–54)
AST: 21 U/L (ref 15–41)
Alkaline Phosphatase: 100 U/L (ref 38–126)
Anion gap: 9 (ref 5–15)
BUN: 12 mg/dL (ref 6–20)
CHLORIDE: 106 mmol/L (ref 101–111)
CO2: 27 mmol/L (ref 22–32)
CREATININE: 0.85 mg/dL (ref 0.44–1.00)
Calcium: 8.8 mg/dL — ABNORMAL LOW (ref 8.9–10.3)
GFR calc Af Amer: >60 mL/min (ref >60)
GFR calc non Af Amer: >60 mL/min (ref >60)
GLUCOSE: 118 mg/dL — AB (ref 65–99)
POTASSIUM: 3.3 mmol/L — AB (ref 3.5–5.1)
SODIUM: 142 mmol/L (ref 135–145)
Total Bilirubin: 0.3 mg/dL (ref 0.3–1.2)
Total Protein: 6.4 g/dL — ABNORMAL LOW (ref 6.5–8.1)

## 2016-06-06 LAB — SEDIMENTATION RATE: Sed Rate: 13 mm/hr (ref 0–30)

## 2016-06-06 LAB — VITAMIN B12: VITAMIN B 12: 593 pg/mL (ref 180–914)

## 2016-06-06 LAB — C-REACTIVE PROTEIN: CRP: 1.1 mg/dL — ABNORMAL HIGH (ref <1.0)

## 2016-06-06 LAB — MAGNESIUM: Magnesium: 1.7 mg/dL (ref 1.7–2.4)

## 2016-06-09 LAB — 25-HYDROXY VITAMIN D LCMS D2+D3
25-Hydroxy, Vitamin D-2: 2.2 ng/mL
25-Hydroxy, Vitamin D-3: 37 ng/mL

## 2016-06-09 LAB — 25-HYDROXYVITAMIN D LCMS D2+D3: 25-HYDROXY, VITAMIN D: 39 ng/mL

## 2016-06-10 NOTE — Progress Notes (Signed)

## 2016-06-10 NOTE — Progress Notes (Signed)
Quick Note:   Potassium levels below 3.6 mmol/L are considered to be low. Levels (less than 2.5 mmol/L) can be life-threatening and requires urgent medical attention. Low potassium (hypokalemia) has many causes. The most common cause is excessive potassium loss in urine due to prescription water or fluid pills (diuretics). Vomiting or diarrhea or both can result in excessive potassium loss from the digestive tract. Causes of potassium loss leading to low potassium include: chronic kidney disease; diabetic ketoacidosis; diarrhea; excessive alcohol use; excessive laxative use; excessive sweating; folic acid deficiency; diuretics; primary aldosteronism; vomiting; and/or some antibiotic use.  Normal fasting (NPO x 8 hours) glucose levels are between 65-99 mg/dl, with 2 hour fasting, levels are usually less than 140 mg/dl. Any random blood glucose level greater than 200 mg/dl is considered to be Diabetes.  Normal calcium levels are between 9.0 and 10.5 mg/dl. The most common cause of low total calcium is: Low blood protein levels, especially a low level of albumin, which can result from liver disease or malnutrition, both of which may result from alcoholism or other illnesses. Low albumin is also very common in people who are acutely ill. With low albumin, only the bound calcium is low. Ionized calcium remains normal, and calcium metabolism is being regulated appropriately. Some other causes of hypocalcemia include: Underactive parathyroid gland (hypoparathyroidism); Inherited resistance to the effects of parathyroid hormone; Extreme deficiency in dietary calcium; Decreased levels of vitamin D; Magnesium deficiency; Increased levels of phosphorus; Acute inflammation of the pancreas (pancreatitis); & Renal failure.  A low total protein level can suggest a liver disorder, a kidney disorder, or a disorder in which protein is not digested or absorbed properly. Low levels may be seen in severe malnutrition and with  conditions that cause malabsorption, such as celiac disease or inflammatory bowel disease (IBD).  Drugs that may decrease protein levels include estrogens and oral contraceptives.  Total protein may decrease in conditions: 1. Where production of albumin or globulin proteins is impaired, such as malnutrition or severe liver disease 2. That accelerate the breakdown or loss of protein, such as kidney disease (nephrotic syndrome) 3. That increase/expand plasma volume (diluting the blood), such as congestive heart failure  ______

## 2016-06-13 LAB — TOXASSURE SELECT 13 (MW), URINE: PDF: 0

## 2016-06-14 ENCOUNTER — Telehealth: Payer: Self-pay | Admitting: Pain Medicine

## 2016-06-14 NOTE — Telephone Encounter (Signed)
Patient notified of labwork.Encouraged patient to sign up for My chart.  States she goes to PCP on Monday and will notify her of labs.

## 2016-06-14 NOTE — Telephone Encounter (Signed)
Would like results of labs

## 2016-06-17 ENCOUNTER — Encounter: Payer: Medicare Other | Admitting: Pain Medicine

## 2016-06-19 ENCOUNTER — Other Ambulatory Visit: Payer: Self-pay | Admitting: Family Medicine

## 2016-06-19 DIAGNOSIS — Z1231 Encounter for screening mammogram for malignant neoplasm of breast: Secondary | ICD-10-CM

## 2016-06-25 ENCOUNTER — Telehealth: Payer: Self-pay

## 2016-06-25 ENCOUNTER — Telehealth: Payer: Self-pay | Admitting: Pain Medicine

## 2016-06-25 NOTE — Telephone Encounter (Signed)
[facility] / calling to verify its ok to fill pain script from surgeon

## 2016-06-25 NOTE — Telephone Encounter (Signed)
Th pharmacy who was questioning whether or not ti fill prescription for Percocet 5/325 mg #90, take 1 -2 tablets Q 4-6 hours prn not to exceed 8 per day.  Spoke with Dr Laban Emperor who instructed me to tell patient not to take our Percocet 5/325 mg at all while taking surgeon Percocet.  Pharmacy notified.  Patient notified not to take Our Percocet at all while taking Percocet from surgeon.  Patient states understanding and teach back was done.

## 2016-07-12 ENCOUNTER — Emergency Department
Admission: EM | Admit: 2016-07-12 | Discharge: 2016-07-13 | Disposition: A | Discharge status: Home / Self Care | Payer: Medicare Other | Attending: Emergency Medicine | Admitting: Emergency Medicine

## 2016-07-12 ENCOUNTER — Emergency Department: Payer: Medicare Other

## 2016-07-12 DIAGNOSIS — E119 Type 2 diabetes mellitus without complications: Secondary | ICD-10-CM | POA: Diagnosis not present

## 2016-07-12 DIAGNOSIS — Z791 Long term (current) use of non-steroidal anti-inflammatories (NSAID): Secondary | ICD-10-CM | POA: Insufficient documentation

## 2016-07-12 DIAGNOSIS — W1839XA Other fall on same level, initial encounter: Secondary | ICD-10-CM | POA: Diagnosis not present

## 2016-07-12 DIAGNOSIS — Y939 Activity, unspecified: Secondary | ICD-10-CM | POA: Diagnosis not present

## 2016-07-12 DIAGNOSIS — I252 Old myocardial infarction: Secondary | ICD-10-CM | POA: Diagnosis not present

## 2016-07-12 DIAGNOSIS — Z79899 Other long term (current) drug therapy: Secondary | ICD-10-CM | POA: Diagnosis not present

## 2016-07-12 DIAGNOSIS — J449 Chronic obstructive pulmonary disease, unspecified: Secondary | ICD-10-CM | POA: Diagnosis not present

## 2016-07-12 DIAGNOSIS — Y92009 Unspecified place in unspecified non-institutional (private) residence as the place of occurrence of the external cause: Secondary | ICD-10-CM

## 2016-07-12 DIAGNOSIS — J45909 Unspecified asthma, uncomplicated: Secondary | ICD-10-CM | POA: Diagnosis not present

## 2016-07-12 DIAGNOSIS — I10 Essential (primary) hypertension: Secondary | ICD-10-CM | POA: Insufficient documentation

## 2016-07-12 DIAGNOSIS — F17210 Nicotine dependence, cigarettes, uncomplicated: Secondary | ICD-10-CM | POA: Insufficient documentation

## 2016-07-12 DIAGNOSIS — Z7982 Long term (current) use of aspirin: Secondary | ICD-10-CM | POA: Diagnosis not present

## 2016-07-12 DIAGNOSIS — Y92012 Bathroom of single-family (private) house as the place of occurrence of the external cause: Secondary | ICD-10-CM | POA: Diagnosis not present

## 2016-07-12 DIAGNOSIS — S4991XA Unspecified injury of right shoulder and upper arm, initial encounter: Secondary | ICD-10-CM | POA: Diagnosis present

## 2016-07-12 DIAGNOSIS — Y999 Unspecified external cause status: Secondary | ICD-10-CM | POA: Insufficient documentation

## 2016-07-12 DIAGNOSIS — I2510 Atherosclerotic heart disease of native coronary artery without angina pectoris: Secondary | ICD-10-CM | POA: Insufficient documentation

## 2016-07-12 DIAGNOSIS — W19XXXA Unspecified fall, initial encounter: Secondary | ICD-10-CM

## 2016-07-12 HISTORY — DX: Ulcerative (chronic) pancolitis without complications: K51.00

## 2016-07-12 NOTE — ED Provider Notes (Signed)
[facility] Emergency Department Provider Note ____________________________________________  Time seen: Approximately 10:34 PM  I have reviewed the triage vital signs and the nursing notes.   HISTORY  Chief Complaint Fall (should, hip and knee pain)    HPI Michelle Newton is a 55 y.o. female who presents to the emergency department for evaluation of right shoulder pain. She is 5-6 weeks post op from rotator cuff repair and fracture repair. She has a history of falls related to vertigo. She states she stood up from the toilet, turned around too fast and lost her balance and fell on her right shoulder, landing on linoleum floor. She now has severe pain in her right shoulder that is similar to the pain she experienced prior to surgery. She has had multiple surgeries on the right shoulder. She is under pain management.  Past Medical History:  Diagnosis Date  . Anxiety   . Asthma   . CAD (coronary artery disease)   . Chronic fatigue   . Chronic thumb pain, bilateral 09/12/2015  . COPD (chronic obstructive pulmonary disease) (HCC)   . Coronary artery disease   . Depression   . DJD (degenerative joint disease)   . DJD (degenerative joint disease)   . Essential hypertension 09/12/2015  . Family history of chronic pain 09/26/2015  . Fibromyalgia   . Fibromyalgia   . Fracture five ribs-closed 09/12/2015  . H/O acute myocardial infarction 12/14/2014  . H/O non-insulin dependent diabetes mellitus 09/12/2015  . History of cardiac arrhythmia 09/12/2015  . History of elbow surgery 09/26/2015   Left ulnar nerve transposition  . History of knee surgery 09/26/2015  . History of migraine 09/12/2015  . History of seizures 09/12/2015  . Hypercholesteremia   . MI (myocardial infarction) (HCC)   . Migraine   . Migraine   . Osteoarthritis   . Seizures (HCC)   . Ulcerative (chronic) enterocolitis (HCC)   . Vertigo     Patient Active Problem List   Diagnosis Date Noted   . Disturbance of skin sensation 06/05/2016  . S/P shoulder surgery (05/24/2016) (Right) 06/05/2016  . Acromial fracture 05/09/2016  . Right shoulder: Acute Pain due to trauma 04/18/2016  . Abnormal MRI, shoulder (Right) 04/18/2016  . Right shoulder: Adhesive capsulitis 04/18/2016  . Right shoulder: Torn & retracted Infraspinatus tendon 04/18/2016  . Right shoulder: Torn & retracted Supraspinatus tendon (Complete, posterior full-thickness tear) 04/18/2016  . Right shoulder: Subscapularis tendinopathy 04/18/2016  . Right shoulder: Biceps tendinopathy 04/18/2016  . Right shoulder: Avascular necrosis of humeral head (HCC) 04/18/2016  . Right shoulder: Subacromial/Subdeltoid bursitis 04/18/2016  . CRPS (complex regional pain syndrome), type I, upper (Right) 03/19/2016  . Right shoulder:  Rotator cuff Disorder 02/22/2016  . Long term current use of opiate analgesic 11/13/2015  . Long term prescription opiate use 11/13/2015  . Opioid dependence, daily use (HCC) 11/13/2015  . Chronic pain 11/13/2015  . Chronic cervical radicular pain (Left) 09/26/2015  . Chronic fatigue syndrome 09/26/2015  . Family history of chronic pain 09/26/2015  . History of knee surgery 09/26/2015  . History of elbow surgery 09/26/2015  . Vitamin D insufficiency 09/21/2015  . Encounter for therapeutic drug level monitoring 09/12/2015  . Encounter for long-term use of opiate analgesic 09/12/2015  . Opiate use (37.5 MME/Day) 09/12/2015  . Right shoulder: Chronic Pain 09/12/2015  . Cervical facet syndrome 09/12/2015  . Cervical spondylosis 09/12/2015  . Chronic neck pain 09/12/2015  . Chronic hip pain (Left) 09/12/2015  . Lumbar facet  syndrome 09/12/2015  . Lumbar spondylosis 09/12/2015  . 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 Chronic low back pain 09/12/2015  . Fibromyalgia 09/12/2015  . Chronic pain syndrome 09/12/2015  . Chronic thumb pain (Bilateral) 09/12/2015  . Upper extremity pain 09/12/2015  .  Dorsalgia of occipito-atlanto-axial region (Bilateral) 09/12/2015  . Occipital neuralgia (Bilateral) 09/12/2015  . Smoker 09/12/2015  . Generalized anxiety disorder 09/12/2015  . Depression 09/12/2015  . Wrist arthritis 09/12/2015  . Fracture five ribs-closed 09/12/2015  . Chronic obstructive pulmonary disease (COPD) (HCC) 09/12/2015  . History of seizures 09/12/2015  . History of migraine 09/12/2015  . H/O non-insulin dependent diabetes mellitus 09/12/2015  . Essential hypertension 09/12/2015  . High cholesterol 09/12/2015  . Coronary artery disease 09/12/2015  . History of cardiac arrhythmia 09/12/2015  . H/O vertigo 09/12/2015  . Cervical pain 07/19/2015  . Right shoulder: Disorder 07/10/2015  . Chronic generalized pain 12/14/2014  . Moderate COPD (chronic obstructive pulmonary disease) (HCC) 12/14/2014  . H/O acute myocardial infarction 12/14/2014  . Right shoulder: S/P Rotator cuff repair 11/15/2014  . Bladder cystocele 07/29/2014    Past Surgical History:  Procedure Laterality Date  . ABDOMINAL HYSTERECTOMY    . KNEE ARTHROPLASTY Left   . left elbow surgery    . LUMBAR DISC SURGERY    . ROTATOR CUFF REPAIR Right   . TRIGGER FINGER RELEASE Left   . ureter mesh      Prior to Admission medications   Medication Sig Start Date End Date Taking? Authorizing Provider  albuterol-ipratropium (COMBIVENT) 18-103 MCG/ACT inhaler Inhale 2 puffs into the lungs every 6 (six) hours as needed for wheezing or shortness of breath.    Historical Provider, MD  aspirin EC 81 MG tablet Take 81 mg by mouth daily.    Historical Provider, MD  bisoprolol-hydrochlorothiazide [facility]) 10-6.25 MG tablet Take 1 tablet by mouth daily.    Historical Provider, MD  butalbital-acetaminophen-caffeine (FIORICET, ESGIC) 50-325-40 MG tablet Take 1 tablet by mouth every 4 (four) hours as needed for headache.    Historical Provider, MD  Cholecalciferol (VITAMIN D) 2000 UNITS tablet Take 1 tablet (2,000 Units  total) by mouth daily. 09/21/15   Delano Metz, MD  Cinnamon ([facility] CINNAMON) 500 MG capsule Take 500 mg by mouth daily.    Historical Provider, MD  cyanocobalamin (,VITAMIN B-12,) 1000 MCG/ML injection Every 3 weeks 10/31/14   Historical Provider, MD  cyclobenzaprine (FLEXERIL) 10 MG tablet Take 1 tablet (10 mg total) by mouth 3 (three) times daily. 06/05/16   Delano Metz, MD  diazepam (VALIUM) 5 MG tablet Take 5 mg by mouth every 8 (eight) hours as needed for anxiety.    Historical Provider, MD  dicyclomine (BENTYL) 10 MG capsule take 1 capsule by mouth four times a day if needed for ABDOMINAL SPASM 10/28/15   Historical Provider, MD  estradiol (CLIMARA - DOSED IN MG/24 HR) 0.1 mg/24hr patch Place 0.1 mg onto the skin once a week. On Wednesday    Historical Provider, MD  Fluticasone-Salmeterol (ADVAIR) 100-50 MCG/DOSE AEPB Inhale 1 puff into the lungs 2 (two) times daily.    Historical Provider, MD  Ipratropium-Albuterol (COMBIVENT) 20-100 MCG/ACT AERS respimat Inhale into the lungs.    Historical Provider, MD  Magnesium 400 MG TABS Take 1 tablet by mouth daily.    Historical Provider, MD  nitroGLYCERIN (NITROSTAT) 0.4 MG SL tablet Place 0.4 mg under the tongue every 5 (five) minutes as needed for chest pain.    Historical Provider, MD  oxyCODONE-acetaminophen (PERCOCET/ROXICET) 5-325 MG tablet Take 1 tablet by mouth 5 (five) times daily as needed for severe pain. 06/05/16   Delano MetzFrancisco Naveira, MD  oxyCODONE-acetaminophen (PERCOCET/ROXICET) 5-325 MG tablet Take 1 tablet by mouth 5 (five) times daily as needed for severe pain. 06/05/16   Delano MetzFrancisco Naveira, MD  oxyCODONE-acetaminophen (PERCOCET/ROXICET) 5-325 MG tablet Take 1 tablet by mouth 5 (five) times daily as needed for severe pain. 06/05/16   Delano MetzFrancisco Naveira, MD  PARoxetine (PAXIL) 30 MG tablet Take 30 mg by mouth daily.    Historical Provider, MD  Potassium 95 MG TABS Take 1 tablet by mouth daily.    Historical Provider, MD  Probiotic  Product (PROBIOTIC ADVANCED PO) Take 2 tablets by mouth daily.    Historical Provider, MD  promethazine (PHENERGAN) 25 MG tablet Take 25 mg by mouth every 6 (six) hours as needed for nausea or vomiting.    Historical Provider, MD  rosuvastatin (CRESTOR) 20 MG tablet Take 20 mg by mouth at bedtime.     Historical Provider, MD  vitamin E 1000 UNIT capsule Take 1,000 Units by mouth daily.    Historical Provider, MD    Allergies Cymbalta [duloxetine hcl]; Duloxetine; Flagyl [metronidazole]; Lyrica [pregabalin]; Vicodin [hydrocodone-acetaminophen]; Nsaids; and Tape  Family History  Problem Relation Age of Onset  . Breast cancer Cousin   . Breast cancer Maternal Aunt     Social History Social History  Substance Use Topics  . Smoking status: Current Every Day Smoker    Packs/day: 0.50    Types: Cigarettes  . Smokeless tobacco: Never Used  . Alcohol use No    Review of Systems Constitutional: No recent illness. Cardiovascular: Denies chest pain or palpitations. Respiratory: Denies shortness of breath. Musculoskeletal: Pain in right shoulder. Skin: Negative for rash, wound, lesion. Neurological: Negative for focal weakness or numbness.  ____________________________________________   PHYSICAL EXAM:  VITAL SIGNS: ED Triage Vitals  Enc Vitals Group     BP      Pulse      Resp      Temp      Temp src      SpO2      Weight      Height      Head Circumference      Peak Flow      Pain Score      Pain Loc      Pain Edu?      Excl. in GC?     Constitutional: Alert and oriented. Well appearing and in no acute distress. Eyes: Conjunctivae are normal. EOMI. Head: Atraumatic. Neck: No stridor.  Respiratory: Normal respiratory effort.   Musculoskeletal: Limited ROM of the right shoulder due to pain and recent surgery. No obvious deformity. Neurologic:  Normal speech and language. No gross focal neurologic deficits are appreciated. Speech is normal. No gait instability. Skin:   Skin is warm, dry and intact. Atraumatic. Psychiatric: Mood and affect are normal. Speech and behavior are normal.  ____________________________________________   LABS (all labs ordered are listed, but only abnormal results are displayed)  Labs Reviewed - No data to display ____________________________________________  RADIOLOGY  No acute bony abnormality but concern for rotator cuff tear per radiology. ____________________________________________   PROCEDURES  Procedure(s) performed: Shoulder immobilizer applied to the right shoulder.   ____________________________________________   INITIAL IMPRESSION / ASSESSMENT AND PLAN / ED COURSE  Pertinent labs & imaging results that were available during my care of the patient were reviewed by me and considered in my  medical decision making (see chart for details).  Patient was advised that rotator cuff tear can not be ruled out. She was placed in a shoulder immobilizer. She was instructed to call her orthopedic surgeon and schedule an appointment. She declined pain medication while in the emergency department and will speak with her pain management provider if she needs to adjust her medication schedule. She was advised to return to the ER for symptoms that change or worsen if unable to see the specialist. She was also advised to speak with her primary care provider about the recurrent falls and vertigo.  ____________________________________________   FINAL CLINICAL IMPRESSION(S) / ED DIAGNOSES  Final diagnoses:  Shoulder injury, right, initial encounter  Fall at home, initial encounter       Chinita Pester, FNP 07/12/16 2355    Jennye Moccasin, MD 07/15/16 1410

## 2016-07-12 NOTE — ED Triage Notes (Signed)
Pt reports hx of falls due to vertigo. Pt reports hx of surgery to right shoulder for rotator cuff, and clavicle break.   Pt reports she fell at approx 2200 today and injured her right shoulder, knee and hip. Pt states: "I'm not worried about the knee or hip - if those were broken I couldn't walk"

## 2016-07-12 NOTE — ED Notes (Addendum)
Pt reports she took a percocet 5-325 at 1930 this evening for chronic pain/fibromyalgia. Pt initially reported she took medication at 2100. Pt then reported 1830, then stated maybe 1930.

## 2016-07-13 NOTE — ED Notes (Signed)
Reviewed d/c instructions, follow-up care with pt. PT verbalized understanding

## 2016-07-18 ENCOUNTER — Encounter: Payer: Self-pay | Admitting: Pain Medicine

## 2016-07-18 ENCOUNTER — Telehealth: Payer: Self-pay | Admitting: *Deleted

## 2016-07-18 NOTE — Telephone Encounter (Signed)
patient called and instructed (per  Discussion with  Dr. Laban EmperorNaveira-) that script cannot be filled until 07/28/16 as she was given a 30 day supply on 06/28/2016. Patient instructed to call surgeon and discuss with him.

## 2016-07-18 NOTE — Progress Notes (Signed)
Patient's Name: Michelle Newton  Patient type: Established  MRN: [id number]  Service setting: Telephone Encounter  DOB: 11/03/1961  Location: [facility]  DOS: 07/18/2016  Primary Care Physician: Dortha Kern, MD  Note by: Sydnee Levans Laban Emperor, M.D, DABA, DABAPM, DABPM, DABIPP, FIPP  Referring Physician: No ref. provider found  Specialty: Board-Certified Interventional Pain Management     Type of Phone Encounter: Pateint initiated  Re: Early refill of her prescriptions.  Issue:   This patient has a very complicated history involving multiple injuries. Her right shoulder has a history of subacromial decompression and biceps tenodesis done in June 2013. She has had 2 prior scopes on this shoulder - first one about 10 years ago in [place], and more recently in 2013 by Dr. Carney Living, whose op report details a debridement of a 10-15% thickness partial cuff tear, biceps tenodesis, and acromioplasty. Patient reports the shoulder never "felt right" after the surgery. She has had multiple subacromial injections - most recently by her PCP with temporary relief. On 11/15/2014, the patient started having problems with her left shoulder and was evaluated at [facility] by Dr. Alethia Berthold.    On 12/20/2015 I wrote a prescription for oxycodone/APAP to last for 30 days. This prescription was filled on 12/25/2015 and it should've lasted until 01/24/2016. This wears pressure him was for an  MME/Day* of 37.5. On 01/19/2016 the patient underwent a right shoulder arthroscopy with distal claviculectomy by Dr. Olin Hauser Almekinders. At the time she was given a prescription for oxycodone IR 10 mg (#120) for postoperative pain, to last for 10 days, written by Debroah Loop, PA-C. This represents an  MME/Day of 180. On 01/24/2016 she had her second prescription for oxycodone/APAP, that I had written for her on 12/20/2015, filled. Her third prescription was filled on 02/21/2016. On  03/05/2016, 13 days after my prescription, she had a prescription filled for Tylenol 3 #90 (to last for 20 to days) (4 tablets per day), again written by Debroah Loop, PA-C. This is an MME/Day equivalent of 18.41. We so the patient back on 03/19/2016 at which time she had her medication prescriptions written. Her next medication refill was on 03/21/2016 to last until 04/20/2016, at which time she had the next one refilled. On 05/09/2016, she received a second prescription, written by Dr. Olin Hauser Almekinders, for Tylenol 3 #90 to take 1-2 tablets by mouth every 6 hours (12 day supply, good until 05/21/2016) MME/Day of 33.75. At that time, she had not had any surgery or any other procedures done. The third prescription that I had written for the patient on 03/19/2016 was filled on 05/20/2016, 9 days after she had obtained the 05/09/2016 prescription. On 05/24/2016 the patient underwent a right shoulder open reduction and internal fixation of acromial fragment, and a right shoulder open partial rotator cuff repair. This was done by Louis C. Almekinders, MD at [facility]. On that same day, 05/24/2016, 4 days after having filled my 05/20/2016 prescription, and 15 days after having filled the Tylenol 3, she filled the prescription for oxycodone/APAP 10/325 #120 to take as much as 8 tablets per day (15 day supply, good until 06/08/2016), ( MME/Day of 120) this prescription was written by Debroah Loop, PA-C. This was provided to the patient for the management of her acute postoperative pain from the right shoulder ORIF done on 05/24/2016. I so the patient on follow-up on 06/05/2016 at which time she had another 3 prescriptions. At that time,  the patient had 33 of the 150 pills that I have prescribed and she had filled on 05/20/2016. This prescription was written to last until 06/19/2016. However, based on the amount of pills that she had left and assuming that she was taking them and is  instructed (5 per day), she would've had enough to last for another 6 days, or until 06/11/2016, instead of 06/19/2016, as it was supposed to have lasted. Interestingly, the patient did not bring the bottle from the prescription written by Debroah LoopNicole Graf-Perkins, PA-C. Therefore we were unable to count how many pills she had left of this medication. The first prescription that I wrote on 06/05/2016 was supposed to have been filled on 06/19/2016. However, on 06/18/2016 she was given another prescription for Percocet 5/325 #90 to be taken 1-2 tablets by mouth every 4-6 hours, not to exceed 8 tablets per day (12 day supply, to last until 06/30/2016), by Debroah LoopNicole Graf-Perkins, PA-C, after being evaluated at [facility] where she told them that she had ran out of her Percocet 10/325 2 days earlier, at around 06/16/2016. In view of the fact that the patient had this prescription filled on 06/18/2016, her pharmacy declined to have my prescription filled on 06/19/2016. Instead, they held that on file until 06/28/2016 when day filled that for the patient. This prescription should then be good until 07/28/2016, at which time her next prescription should be filled. If she has ran out of her medication, this would mean that she went over her 30 day supply in only 20 days.   The problem that we are running into is that it has seen very difficult to calculate her medication use due to the way that she has been getting her medicines. If we simplify this and start calculating from 01/19/2016 when she underwent her right shoulder arthroscopy, then we see that, assuming that she was using my prescriptions correctly, she should've had 25 of my Percocet 5/325 left on 01/19/2016 (125 mg of oxycodone). At that time she received a prescription for oxycodone 10 mg #120 (1200 mg of oxycodone). On 01/24/2016 she filled her next Percocet 5/325 prescription (750 mg of oxycodone). On 02/21/2016 she filled her next Percocet 5/325 prescription  (750 mg of oxycodone). This was followed by a prescription for Tylenol 3 on 03/05/2016, #90 pills (2700 mg of codeine). On 03/21/2016 she had her next Percocet 5/325 prescription filled (750 mg of oxycodone). Followed by the next prescription of Percocet 5/325 (750 mg of oxycodone) on 04/20/2016. On 05/09/2016 she had another prescription filled for Tylenol 3 (2700 mg of codeine). On 05/20/2016 she had her next Percocet 5/325 (750 mg of oxycodone). This was followed on 05/24/2016 by a prescription for Percocet 10/325 #120 (1200 mg of oxycodone). This was followed on 06/18/2016 by a prescription for some additional Percocet 5/325 #90 (450 mg of oxycodone). Followed on 06/28/2016 by her usual Percocet 5/325 prescription (750 mg of oxycodone). This last prescription should've lasted until 07/28/2016, however today 07/18/2016 the patient called requesting that her next prescription be filled early. If we do the math and start to count on 01/19/2016 and in today 07/18/2016, we have that a total of 182 days have passed. During this time the patient has consumed 7,475 mg of oxycodone (11,212.5 MME) + 5,400 mg of codeine (810 MME), for a total of 12,022.5 MME in 182 days, or a daily average of 66 MME/day. This is almost twice of what we had her on. I understand that having had surgery on  01/19/2016 and again on 05/24/2016, her medication requirements are expected to increase for a short period time. However, I am getting the distinct impression that she is pushing this beyond what should be normal. Her last surgery was on 05/24/2016, approximately 6.5 weeks ago. By now her medication requirements should be going down. It is for this reason that I have denied her early refill. I suspect that by me denied in this early refill, this will trigger her to call the orthopedic surgeons to complain of another flareup of her pain and request additional pain medicine. If this is the case, we clearly have a problem.  Disposition:  Early refill denied.  Service: Phone call. Note by: Awa Bachicha A. Laban EmperorNaveira, M.D, DABA, DABAPM, DABPM, DABIPP, FIPP   *MME/Day = morphine milligram equivalents per day

## 2016-07-22 ENCOUNTER — Telehealth: Payer: Self-pay | Admitting: *Deleted

## 2016-07-23 ENCOUNTER — Encounter: Payer: Self-pay | Admitting: Pain Medicine

## 2016-07-23 ENCOUNTER — Ambulatory Visit: Payer: Medicare Other | Attending: Pain Medicine | Admitting: Pain Medicine

## 2016-07-23 VITALS — BP 158/93 | HR 76 | Temp 98.1°F | Resp 16 | Ht 59.5 in | Wt 123.0 lb

## 2016-07-23 DIAGNOSIS — E7800 Pure hypercholesterolemia, unspecified: Secondary | ICD-10-CM | POA: Insufficient documentation

## 2016-07-23 DIAGNOSIS — M797 Fibromyalgia: Secondary | ICD-10-CM | POA: Insufficient documentation

## 2016-07-23 DIAGNOSIS — N8110 Cystocele, unspecified: Secondary | ICD-10-CM | POA: Diagnosis not present

## 2016-07-23 DIAGNOSIS — G8929 Other chronic pain: Secondary | ICD-10-CM | POA: Diagnosis not present

## 2016-07-23 DIAGNOSIS — F329 Major depressive disorder, single episode, unspecified: Secondary | ICD-10-CM | POA: Insufficient documentation

## 2016-07-23 DIAGNOSIS — R569 Unspecified convulsions: Secondary | ICD-10-CM | POA: Diagnosis not present

## 2016-07-23 DIAGNOSIS — M5489 Other dorsalgia: Secondary | ICD-10-CM

## 2016-07-23 DIAGNOSIS — X58XXXA Exposure to other specified factors, initial encounter: Secondary | ICD-10-CM | POA: Insufficient documentation

## 2016-07-23 DIAGNOSIS — J449 Chronic obstructive pulmonary disease, unspecified: Secondary | ICD-10-CM | POA: Insufficient documentation

## 2016-07-23 DIAGNOSIS — G43909 Migraine, unspecified, not intractable, without status migrainosus: Secondary | ICD-10-CM | POA: Insufficient documentation

## 2016-07-23 DIAGNOSIS — F411 Generalized anxiety disorder: Secondary | ICD-10-CM | POA: Diagnosis not present

## 2016-07-23 DIAGNOSIS — M542 Cervicalgia: Secondary | ICD-10-CM

## 2016-07-23 DIAGNOSIS — M47816 Spondylosis without myelopathy or radiculopathy, lumbar region: Secondary | ICD-10-CM | POA: Diagnosis not present

## 2016-07-23 DIAGNOSIS — S2249XA Multiple fractures of ribs, unspecified side, initial encounter for closed fracture: Secondary | ICD-10-CM | POA: Diagnosis not present

## 2016-07-23 DIAGNOSIS — M879 Osteonecrosis, unspecified: Secondary | ICD-10-CM | POA: Insufficient documentation

## 2016-07-23 DIAGNOSIS — M25511 Pain in right shoulder: Secondary | ICD-10-CM | POA: Insufficient documentation

## 2016-07-23 DIAGNOSIS — M47812 Spondylosis without myelopathy or radiculopathy, cervical region: Secondary | ICD-10-CM | POA: Insufficient documentation

## 2016-07-23 DIAGNOSIS — M7551 Bursitis of right shoulder: Secondary | ICD-10-CM | POA: Insufficient documentation

## 2016-07-23 DIAGNOSIS — M545 Low back pain: Secondary | ICD-10-CM | POA: Insufficient documentation

## 2016-07-23 DIAGNOSIS — G90511 Complex regional pain syndrome I of right upper limb: Secondary | ICD-10-CM | POA: Insufficient documentation

## 2016-07-23 DIAGNOSIS — M25512 Pain in left shoulder: Secondary | ICD-10-CM | POA: Diagnosis not present

## 2016-07-23 DIAGNOSIS — M25552 Pain in left hip: Secondary | ICD-10-CM | POA: Diagnosis not present

## 2016-07-23 DIAGNOSIS — E559 Vitamin D deficiency, unspecified: Secondary | ICD-10-CM | POA: Insufficient documentation

## 2016-07-23 DIAGNOSIS — Z9889 Other specified postprocedural states: Secondary | ICD-10-CM | POA: Insufficient documentation

## 2016-07-23 DIAGNOSIS — R200 Anesthesia of skin: Secondary | ICD-10-CM | POA: Insufficient documentation

## 2016-07-23 DIAGNOSIS — I10 Essential (primary) hypertension: Secondary | ICD-10-CM | POA: Diagnosis not present

## 2016-07-23 DIAGNOSIS — S46811A Strain of other muscles, fascia and tendons at shoulder and upper arm level, right arm, initial encounter: Secondary | ICD-10-CM | POA: Diagnosis not present

## 2016-07-23 DIAGNOSIS — Z90710 Acquired absence of both cervix and uterus: Secondary | ICD-10-CM | POA: Insufficient documentation

## 2016-07-23 DIAGNOSIS — F17210 Nicotine dependence, cigarettes, uncomplicated: Secondary | ICD-10-CM | POA: Insufficient documentation

## 2016-07-23 DIAGNOSIS — E119 Type 2 diabetes mellitus without complications: Secondary | ICD-10-CM | POA: Diagnosis not present

## 2016-07-23 DIAGNOSIS — M5481 Occipital neuralgia: Secondary | ICD-10-CM | POA: Diagnosis not present

## 2016-07-23 DIAGNOSIS — I252 Old myocardial infarction: Secondary | ICD-10-CM | POA: Insufficient documentation

## 2016-07-23 DIAGNOSIS — Z79891 Long term (current) use of opiate analgesic: Secondary | ICD-10-CM | POA: Insufficient documentation

## 2016-07-23 DIAGNOSIS — M8580 Other specified disorders of bone density and structure, unspecified site: Secondary | ICD-10-CM | POA: Insufficient documentation

## 2016-07-23 MED ORDER — KETOROLAC TROMETHAMINE 60 MG/2ML IM SOLN
60.0000 mg | Freq: Once | INTRAMUSCULAR | Status: AC
  Administered 2016-07-23: 60 mg via INTRAMUSCULAR
  Filled 2016-07-23: qty 2

## 2016-07-23 MED ORDER — ORPHENADRINE CITRATE 30 MG/ML IJ SOLN
60.0000 mg | Freq: Once | INTRAMUSCULAR | Status: AC
  Administered 2016-07-23: 60 mg via INTRAMUSCULAR
  Filled 2016-07-23: qty 2

## 2016-07-23 NOTE — Patient Instructions (Signed)

## 2016-07-23 NOTE — Progress Notes (Signed)
Patient here for evaluation of neck pain.  Patient has had recent surgery on the right shoulder and has appt with orthopedic surgeon to discuss treatment of left shoulder and results of CT scan taken last week.   Safety precautions to be maintained throughout the outpatient stay will include: orient to surroundings, keep bed in low position, maintain call bell within reach at all times, provide assistance with transfer out of bed and ambulation.

## 2016-07-23 NOTE — Progress Notes (Signed)
Patient's Name: Michelle Newton  Patient type: Established  MRN: [id number]  Service setting: Ambulatory outpatient  DOB: 12-Feb-1961  Location: [facility]  DOS: 07/23/2016  Primary Care Physician: Lynnell Jude, MD  Note by: Kathlen Brunswick. Dossie Arbour, M.D  Referring Physician: Lynnell Jude, MD  Specialty: Interventional Pain Management  Last Visit to Pain Management: 07/22/2016   Primary Reason(s) for Visit: Encounter for prescription drug management (Level of risk: moderate) CC: Neck Pain   HPI  Ms. Faerber is a 55 y.o. year old, female patient, who returns today as an established patient. She has Encounter for therapeutic drug level monitoring; Encounter for long-term use of opiate analgesic; Opiate use (37.5 MME/Day); Right shoulder: Chronic Pain; Cervical facet syndrome; Cervical spondylosis; Chronic neck pain (B) (L>R); Chronic hip pain (Left); Lumbar facet syndrome; Lumbar spondylosis; Failed back surgical syndrome; Chronic low back pain; Fibromyalgia; Chronic pain syndrome; Right shoulder: Disorder; Chronic generalized pain; Moderate COPD (chronic obstructive pulmonary disease) ([place]); Bladder cystocele; H/O acute myocardial infarction; Right shoulder: S/P Rotator cuff repair; Cervical pain; Chronic thumb pain (Bilateral); Upper extremity pain; Dorsalgia of occipito-atlanto-axial region (Bilateral) (L>R); Occipital neuralgia (Bilateral); Smoker; Generalized anxiety disorder; Depression; Wrist arthritis; Fracture five ribs-closed; Chronic obstructive pulmonary disease (COPD) ([place]); History of seizures; History of migraine; H/O non-insulin dependent diabetes mellitus; Essential hypertension; High cholesterol; Coronary artery disease; History of cardiac arrhythmia; H/O vertigo; Vitamin D insufficiency; Chronic cervical radicular pain (Left); Chronic fatigue syndrome; Family history of chronic pain; History of knee surgery; History of elbow surgery; Long term current use of opiate  analgesic; Long term prescription opiate use; Opioid dependence, daily use ([place]); Chronic pain; Right shoulder:  Rotator cuff Disorder; CRPS (complex regional pain syndrome), type I, upper (Right); Right shoulder: Acute Pain due to trauma; Abnormal MRI, shoulder (Right); Right shoulder: Adhesive capsulitis; Right shoulder: Torn & retracted Infraspinatus tendon; Right shoulder: Torn & retracted Supraspinatus tendon (Complete, posterior full-thickness tear); Right shoulder: Subscapularis tendinopathy; Right shoulder: Biceps tendinopathy; Right shoulder: Avascular necrosis of humeral head ([place]); Right shoulder: Subacromial/Subdeltoid bursitis; Acromial fracture; Disturbance of skin sensation; S/P shoulder surgery (05/24/2016) (Right); COPD, moderate (HCC); and Chronic pain of shoulders (B) ((R>L) on her problem list.. Her primarily concern today is the Neck Pain  Pain Assessment: Self-Reported Pain Score: 8  Clinically the patient looks like a 3/10 Reported level is inconsistent with clinical obrservations Information on the proper use of the pain score provided to the patient today. Pain Type: Chronic pain Pain Location: Neck Pain Descriptors / Indicators: Constant (painful) Pain Frequency: Constant  The patient comes into the clinics today for pharmacological management of her chronic pain. I last saw this patient on 06/25/2016. The patient  reports that she does not use drugs. Her body mass index is 24.43 kg/m. The patient comes into the clinics today to talk to me about the issues that are taking place with regards to her medications. Of course, at this point she apparently has no medications since she took more medication than what I had prescribed and she used the 30 day supply in 20 days. She understands that I will not be given her an early refill. I also talked bedtime to talk to the patient about the conversation that I had with the orthopedic PA. I made it very clear to the patient that they have  the expertise when he comes to her shoulder pathology and therefore they needed to complete their evaluation so as to decide whether or not she would be a  good candidate for any further surgery or joint replacement. I spoke to the patient about the possibility of doing the suprascapular nerve blocks and also loss I did that, she started throwing hurdles them away. She indicates that she cannot tolerate any NSAIDs and that she will not be taking any more steroids. She claims that before I started treating her she had a lot of injections containing steroids and she blames those for her osteopenia. However, I had a long conversation with her today about the fact that she has a problem with the way that she moves around. I made her known to the patient that she probably has the highest incidence of accidents among all of my patients. We quickly went over all of the accidents and fractures that she has had since I met her and she has realized that it is quite a bit.  We talked about her medications and her medication usage in the reasons why I will not longer be seeing her every 3 months for refills. The second that she started not being compliant with my instructions on how to take the medication, she crossed the line and now we will be monitoring her a lot closer. I am concerned that her issues with balance and the high risk of falls that she has is secondary to her opioid and benzodiazepine use. Today I have once again reminded her that she needs to completely come off of her benzodiazepines. She keeps telling me that she has been doing this but I do not see any significant improvement. Today the patient was given a Toradol 60 mg Norflex 60 mg IM injection to help her with her discomfort.  Date of Last Visit: 06/05/16 Service Provided on Last Visit: Evaluation  Controlled Substance Pharmacotherapy Assessment & REMS (Risk Evaluation and Mitigation Strategy)  Analgesic: Oxycodone/APAP 5/325 one tablet 5 times a  day (25 mg/day) MME/day: 37.5 mg/day. (According to the patient's latest [place], her medication use has fluctuated from the usual 37.5 MME/Day that I prescribed for her to as much as 180 MME/Day, prescribed byGRAF-PERKINS, NICOLE ([facility], PA-C); [facility], [place], [place] [address], for postoperative pain (01/19/2016)) Pill Count: The patient brought in her last prescription which I have voided and scanned into the system. However, she did not bring any of her medications which clearly suggest that she has not. Pharmacokinetics: Onset of action (Liberation/Absorption): Within expected pharmacological parameters Time to Peak effect (Distribution): Timing and results are as within normal expected parameters Duration of action (Metabolism/Excretion): Within normal limits for medication Pharmacodynamics: Analgesic Effect: Less than 50% Activity Facilitation: Medication(s) allow patient to sit, stand, walk, and do the basic ADLs Perceived Effectiveness: Described as relatively effective, allowing for increase in activities of daily living (ADL) Side-effects or Adverse reactions: None reported Monitoring: [place] PMP: Online review of the past 49-monthperiod conducted. Abnormal. Results discussed with patient Last UDS on record: [employer] Select 13  Date Value Ref Range Status  06/05/2016 FINAL  Final    Comment:    ==================================================================== TOXASSURE SELECT 13 (MW) ==================================================================== Test                             Result       Flag       Units Drug Present and Declared for Prescription Verification   Desmethyldiazepam              1755  EXPECTED   ng/mg creat   Oxazepam                       2121         EXPECTED   ng/mg creat   Temazepam                      1539         EXPECTED   ng/mg creat     Desmethyldiazepam, oxazepam, and temazepam are expected    metabolites of diazepam. Desmethyldiazepam and oxazepam are also    expected metabolites of other drugs, including chlordiazepoxide,    prazepam, clorazepate, and halazepam. Oxazepam is an expected    metabolite of temazepam. Oxazepam and temazepam are also    available as scheduled prescription medications.   Oxycodone                      8668         EXPECTED   ng/mg creat   Oxymorphone                    6658         EXPECTED   ng/mg creat   Noroxycodone                   16426        EXPECTED   ng/mg creat   Noroxymorphone                 516          EXPECTED   ng/mg creat    Sources of oxycodone are scheduled prescription medications.    Oxymorphone, noroxycodone, and noroxymorphone are expected    metabolites of oxycodone. Oxymorphone is also available as a    scheduled prescription medication. Drug Absent but Declared for Prescription Verification   Butalbital                     Not Detected UNEXPECTED ==================================================================== Test                      Result    Flag   Units      Ref Range   Creatinine              38               mg/dL      >=20 ==================================================================== Declared Medications:  The flagging and interpretation on this report are based on the  following declared medications.  Unexpected results may arise from  inaccuracies in the declared medications.  **Note: The testing scope of this panel includes these medications:  Butalbital (Butalbital/APAP/Caffeine)  Diazepam (Valium)  Oxycodone (Oxycodone Acetaminophen)  **Note: The testing scope of this panel does not include following  reported medications:  Acetaminophen (Butalbital/APAP/Caffeine)  Acetaminophen (Oxycodone Acetaminophen)  Albuterol (Combivent)  Aspirin  Bisoprolol (Ziac)  Caffeine (Butalbital/APAP/Caffeine)  Cyclobenzaprine (Flexeril)  Dicyclomine  (Bentyl)  Estradiol  Fluticasone (Advair)  Hydrochlorothiazide (Ziac)  Ipratropium (Combivent)  Magnesium  Methylprednisolone (Medrol Dose Pack)  Nitroglycerin  Paroxetine  Potassium  Promethazine  Rosuvastatin (Crestor)  Salmeterol (Advair)  Supplement  Supplement (Probiotic)  Vitamin B12  Vitamin D  Vitamin E ==================================================================== For clinical consultation, please call [phone]. ====================================================================    UDS interpretation: Compliant          Medication Assessment Form: Reviewed. Patient indicates being  compliant with therapy Treatment compliance: Compliant Risk Assessment: Aberrant Behavior: non-compliance with medical instructions on the proper use of the medication, unsanctioned dose escalation, diminised ability to recongnize a problem with one's behavior or  use of the medication, claims that "nothing else works" and frequent involvement in accidents Substance Use Disorder (SUD) Risk Level: Very High Risk of opioid abuse or dependence: 0.7-3.0% with doses ? 36 MME/day and 6.1-26% with doses ? 120 MME/day. Opioid Risk Tool (ORT) Score: Total Score: 3 Low Risk for SUD (Score <3) Depression Scale Score: PHQ-2: PHQ-2 Total Score: 6 78.6% Probability of major depressive disorder (6) PHQ-9: PHQ-9 Total Score: 21 Severe depression (20-27): 2.4 times higher risk of SUD and 2.89 times higher risk of overuse  Pharmacologic Plan: No change in therapy, at this time  Laboratory Chemistry  Inflammation Markers Lab Results  Component Value Date   ESRSEDRATE 13 06/06/2016   CRP 1.1 (H) 06/06/2016    Renal Function Lab Results  Component Value Date   BUN 12 06/06/2016   CREATININE 0.85 06/06/2016   GFRAA >60 06/06/2016   GFRNONAA >60 06/06/2016    Hepatic Function Lab Results  Component Value Date   AST 21 06/06/2016   ALT 17 06/06/2016   ALBUMIN 3.7 06/06/2016     Electrolytes Lab Results  Component Value Date   NA 142 06/06/2016   K 3.3 (L) 06/06/2016   CL 106 06/06/2016   CALCIUM 8.8 (L) 06/06/2016   MG 1.7 06/06/2016    Pain Modulating Vitamins Lab Results  Component Value Date   25OHVITD1 39 06/06/2016   25OHVITD2 2.2 06/06/2016   25OHVITD3 37 06/06/2016   VITAMINB12 593 06/06/2016    Coagulation Parameters Lab Results  Component Value Date   PLT 309 12/22/2015    Cardiovascular Lab Results  Component Value Date   HGB 14.8 12/22/2015   HCT 44.2 12/22/2015    Note: Lab results reviewed.  Recent Diagnostic Imaging  Dg Shoulder Right  Result Date: 07/12/2016 CLINICAL DATA:  Pain after falling in bathroom this evening, recent rotator cuff surgery EXAM: RIGHT SHOULDER - 2+ VIEW COMPARISON:  11/07/2015 radiographs, MR 04/16/2016 FINDINGS: Marked osseous demineralization. Degenerative changes and osteolysis at RIGHT [facility] joint. Irregularity at margin of humeral head, new since prior radiographs, question due to recent surgery or avascular necrosis noted on prior MR. Superior subluxation of the RIGHT humeral head at the glenohumeral joint screw reflect rotator cuff tear. No acute fracture or dislocation. Visualized RIGHT ribs appear intact. IMPRESSION: Degenerative changes and osteolysis at RIGHT [facility] joint progressive since 2016. Irregularity of the margin of the humeral head which may related to prior recent rotator cuff surgery or avascular necrosis noted on MR. Superior subluxation of the RIGHT humeral head may be seen with rotator cuff tear. No acute fracture or dislocation. Electronically Signed   By: Lavonia Dana M.D.   On: 07/12/2016 23:20    Meds  The patient has a current medication list which includes the following prescription(s): acetaminophen-codeine, albuterol, albuterol-ipratropium, aspirin ec, benzonatate, bisoprolol-hydrochlorothiazide, butalbital-acetaminophen-caffeine, vitamin d, cinnamon, cyanocobalamin,  cyclobenzaprine, diazepam, dicyclomine, estradiol, fluticasone-salmeterol, hydrochlorothiazide, magnesium, metoprolol tartrate, nitroglycerin, nortriptyline, paroxetine, potassium, promethazine, rosuvastatin, vitamin e, advair diskus, combivent respimat, fluarix quadrivalent, and oxycodone-acetaminophen.  Current Outpatient Prescriptions on File Prior to Visit  Medication Sig  . albuterol-ipratropium (COMBIVENT) 18-103 MCG/ACT inhaler Inhale 2 puffs into the lungs every 6 (six) hours as needed for wheezing or shortness of breath.  Marland Kitchen aspirin EC 81 MG tablet Take 81 mg by mouth daily.  Marland Kitchen  bisoprolol-hydrochlorothiazide (ZIAC) 10-6.25 MG tablet Take 1 tablet by mouth daily.  . butalbital-acetaminophen-caffeine (FIORICET, ESGIC) 50-325-40 MG tablet Take 1 tablet by mouth every 4 (four) hours as needed for headache.  . Cholecalciferol (VITAMIN D) 2000 UNITS tablet Take 1 tablet (2,000 Units total) by mouth daily.  Marland Kitchen Cinnamon ([employer] CINNAMON) 500 MG capsule Take 500 mg by mouth daily.  . cyanocobalamin (,VITAMIN B-12,) 1000 MCG/ML injection Every 3 weeks  . cyclobenzaprine (FLEXERIL) 10 MG tablet Take 1 tablet (10 mg total) by mouth 3 (three) times daily.  . diazepam (VALIUM) 5 MG tablet Take 5 mg by mouth every 8 (eight) hours as needed for anxiety (takes 1/2 - 1 tablet as needed for anxiety).   Marland Kitchen dicyclomine (BENTYL) 10 MG capsule take 1 capsule by mouth four times a day if needed for ABDOMINAL SPASM  . estradiol (CLIMARA - DOSED IN MG/24 HR) 0.1 mg/24hr patch Place 0.1 mg onto the skin once a week. On Wednesday  . Fluticasone-Salmeterol (ADVAIR) 100-50 MCG/DOSE AEPB Inhale 1 puff into the lungs 2 (two) times daily.  . Magnesium 400 MG TABS Take 1 tablet by mouth 2 (two) times daily.   . nitroGLYCERIN (NITROSTAT) 0.4 MG SL tablet Place 0.4 mg under the tongue every 5 (five) minutes as needed for chest pain.  Marland Kitchen PARoxetine (PAXIL) 30 MG tablet Take 30 mg by mouth daily.  . Potassium 95 MG TABS Take 1  tablet by mouth daily.  . promethazine (PHENERGAN) 25 MG tablet Take 25 mg by mouth every 6 (six) hours as needed for nausea or vomiting.  . rosuvastatin (CRESTOR) 20 MG tablet Take 20 mg by mouth at bedtime.   . vitamin E 1000 UNIT capsule Take 1,000 Units by mouth daily.   No current facility-administered medications on file prior to visit.     ROS  Constitutional: Denies any fever or chills Gastrointestinal: No reported hemesis, hematochezia, vomiting, or acute GI distress Musculoskeletal: Denies any acute onset joint swelling, redness, loss of ROM, or weakness Neurological: No reported episodes of acute onset apraxia, aphasia, dysarthria, agnosia, amnesia, paralysis, loss of coordination, or loss of consciousness  Allergies  Ms. Benham is allergic to cymbalta [duloxetine hcl]; duloxetine; flagyl [metronidazole]; lyrica [pregabalin]; vicodin [hydrocodone-acetaminophen]; nsaids; and tape.  [place]  Medical:  Ms. Hulick  has a past medical history of Anxiety; Asthma; CAD (coronary artery disease); Chronic fatigue; Chronic thumb pain, bilateral (09/12/2015); COPD (chronic obstructive pulmonary disease) ([place]); Coronary artery disease; Depression; DJD (degenerative joint disease); DJD (degenerative joint disease); Essential hypertension (09/12/2015); Family history of chronic pain (09/26/2015); Fibromyalgia; Fibromyalgia; Fracture five ribs-closed (09/12/2015); H/O acute myocardial infarction (12/14/2014); H/O non-insulin dependent diabetes mellitus (09/12/2015); History of cardiac arrhythmia (09/12/2015); History of elbow surgery (09/26/2015); History of knee surgery (09/26/2015); History of migraine (09/12/2015); History of seizures (09/12/2015); Hypercholesteremia; MI (myocardial infarction) ([place]); Migraine; Migraine; Osteoarthritis; Seizures ([place]); Ulcerative (chronic) enterocolitis ([place]); and Vertigo. Family: family history includes Breast cancer in her cousin and maternal aunt. Surgical:  has  a past surgical history that includes ureter mesh; Lumbar disc surgery; Rotator cuff repair (Right); Knee Arthroplasty (Left); Abdominal hysterectomy; Trigger finger release (Left); and left elbow surgery. Tobacco:  reports that she has been smoking Cigarettes.  She has been smoking about 0.25 packs per day. She has never used smokeless tobacco. Alcohol:  reports that she does not drink alcohol. Drug:  reports that she does not use drugs.  Constitutional Exam  Vitals: Blood pressure (!) 158/93, pulse 76, temperature 98.1 F (36.7 C), temperature  source Oral, resp. rate 16, height 4' 11.5" (1.511 m), weight 123 lb (55.8 kg), SpO2 100 %. General appearance: Well nourished, well developed, and well hydrated. In no acute distress Calculated BMI/Body habitus: Body mass index is 24.43 kg/m. (18.5-24.9 kg/m2) Ideal body weight Psych/Mental status: Alert and oriented x 3 (person, place, & time) Eyes: PERLA Respiratory: No evidence of acute respiratory distress  Cervical Spine Exam  Inspection: No masses, redness, or swelling Alignment: Symmetrical Functional ROM: Minimal ROM Stability: No instability detected Muscle strength & Tone: Functionally intact Sensory: Movement-associated pain Palpation: Complains of area being tender to palpation  Upper Extremity (UE) Exam    Side: Right upper extremity  Side: Left upper extremity  Inspection: No masses, redness, swelling, or asymmetry  Inspection: No masses, redness, swelling, or asymmetry  Functional ROM: Minimal ROM of the shoulder   Functional ROM: Minimal ROM of the shoulder   Muscle strength & Tone: Functionally intact  Muscle strength & Tone: Functionally intact  Sensory: Movement-associated pain  Sensory: Movement-associated pain  Palpation: Complains of area being tender to palpation  Palpation: Complains of area being tender to palpation   Thoracic Spine Exam  Inspection: No masses, redness, or swelling Alignment: Symmetrical Functional  ROM: ROM appears unrestricted Stability: No instability detected Sensory: Unimpaired Muscle strength & Tone: Functionally intact Palpation: Non-contributory  Lumbar Spine Exam  Inspection: No masses, redness, or swelling Alignment: Symmetrical Functional ROM: ROM appears unrestricted Stability: No instability detected Muscle strength & Tone: Functionally intact Sensory: Unimpaired Palpation: Non-contributory Provocative Tests: Lumbar Hyperextension and rotation test: evaluation deferred today       Patrick's Maneuver: evaluation deferred today              Gait & Posture Assessment  Ambulation: Unassisted Gait: Relatively normal for age and body habitus Posture: WNL   Lower Extremity Exam    Side: Right lower extremity  Side: Left lower extremity  Inspection: No masses, redness, swelling, or asymmetry  Inspection: No masses, redness, swelling, or asymmetry  Functional ROM: ROM appears unrestricted  Functional ROM: ROM appears unrestricted  Muscle strength & Tone: Functionally intact  Muscle strength & Tone: Functionally intact  Sensory: Unimpaired  Sensory: Unimpaired  Palpation: Non-contributory  Palpation: Non-contributory    Assessment & Plan  Primary Diagnosis & Pertinent Problem List: The primary encounter diagnosis was Chronic pain of shoulders (B) ((R>L). Diagnoses of Cervical spondylosis, Dorsalgia of occipito-atlanto-axial region (Bilateral) (L>R), and Chronic neck pain (B) (L>R) were also pertinent to this visit.  Visit Diagnosis: 1. Chronic pain of shoulders (B) ((R>L)   2. Cervical spondylosis   3. Dorsalgia of occipito-atlanto-axial region (Bilateral) (L>R)   4. Chronic neck pain (B) (L>R)     Problems updated and reviewed during this visit: Problem  Chronic pain of shoulders (B) ((R>L)  Cervical spondylosis   grade 1 spondylolisthesis of C3-4 is noted with associated facet arthropathy.   Chronic neck pain (B) (L>R)  Dorsalgia of occipito-atlanto-axial  region (Bilateral) (L>R)  Chronic Generalized Pain  Copd, Moderate (Hcc)    Problem-specific Plan(s): No problem-specific Assessment & Plan notes found for this encounter.  No new Assessment & Plan notes have been filed under this hospital service since the last note was generated. Service: Pain Management   Plan of Care   Problem List Items Addressed This Visit      High   Cervical spondylosis (Chronic)   Relevant Medications   acetaminophen-codeine (TYLENOL #3) 300-30 MG tablet   oxyCODONE-acetaminophen (PERCOCET/ROXICET) 5-325 MG tablet  orphenadrine (NORFLEX) injection 60 mg (Completed)   ketorolac (TORADOL) injection 60 mg (Completed)   Chronic neck pain (B) (L>R) (Chronic)   Relevant Medications   acetaminophen-codeine (TYLENOL #3) 300-30 MG tablet   nortriptyline (PAMELOR) 10 MG capsule   oxyCODONE-acetaminophen (PERCOCET/ROXICET) 5-325 MG tablet   orphenadrine (NORFLEX) injection 60 mg (Completed)   ketorolac (TORADOL) injection 60 mg (Completed)   Chronic pain of shoulders (B) ((R>L) - Primary (Chronic)   Relevant Medications   orphenadrine (NORFLEX) injection 60 mg (Completed)   ketorolac (TORADOL) injection 60 mg (Completed)   Other Relevant Orders   SUPRASCAPULAR NERVE BLOCK   Dorsalgia of occipito-atlanto-axial region (Bilateral) (L>R) (Chronic)   Relevant Medications   orphenadrine (NORFLEX) injection 60 mg (Completed)   ketorolac (TORADOL) injection 60 mg (Completed)    Other Visit Diagnoses   None.      Pharmacotherapy (Medications Ordered): Meds ordered this encounter  Medications  . orphenadrine (NORFLEX) injection 60 mg  . ketorolac (TORADOL) injection 60 mg    Lab-work & Procedure Ordered: Orders Placed This Encounter  Procedures  . SUPRASCAPULAR NERVE BLOCK    Imaging Ordered: None  Interventional Therapies: Scheduled:  Diagnostic bilateral suprascapular nerve block under fluoroscopic guidance and IV sedation without any steroids.     Considering:  Diagnostic bilateral cervical facet block under fluoroscopic guidance and IV sedation. Possible bilateral cervical facet radiofrequency ablation under fluoroscopic guidance and IV sedation. Diagnostic left-sided cervical epidural steroid injection under fluoroscopic guidance, with or without sedation. Diagnostic left intra-articular hip joint injection under fluoroscopic guidance, with or without sedation. Possible left hip joint radiofrequency ablation under fluoroscopic guidance and IV sedation. Diagnostic bilateral lumbar facet block under fluoroscopic guidance and IV sedation. Possible bilateral lumbar facet radiofrequency ablation under fluoroscopic guidance and IV sedation. Diagnostic caudal epidural steroid injection under fluoroscopic guidance, with or without IV sedation. Possible Racz epidural lysis of adhesions under fluoroscopic guidance and IV sedation. Diagnostic right suprascapular nerve block under fluoroscopic guidance, with or without IV sedation. Possible right suprascapular radiofrequency ablation under fluoroscopic guidance and IV sedation. Diagnostic bilateral greater occipital nerve block under fluoroscopic guidance, with or without sedation. Possible bilateral greater occipital nerve radiofrequency ablation under fluoroscopic guidance and IV sedation. Palliative bilateral thumb trigger point injection, without fluoroscopy IV sedation.    PRN Procedures:   Diagnostic bilateral cervical facet block under fluoroscopic guidance and IV sedation.  Diagnostic left intra-articular hip joint injection under fluoroscopic guidance, with or without sedation. Diagnostic bilateral lumbar facet block under fluoroscopic guidance and IV sedation. Diagnostic right suprascapular nerve block under fluoroscopic guidance, with or without IV sedation. Diagnostic bilateral greater occipital nerve block under fluoroscopic guidance, with or without sedation.    Referral(s) or  Consult(s): None at this time.  New Prescriptions   No medications on file    Medications administered during this visit: We administered orphenadrine and ketorolac.  Requested PM Follow-up: Return for Schedule Procedure, (ASAA), In addition, Med-Mgmt appointment before 08/29/16.  Future Appointments Date Time Provider [place]  08/08/2016 12:30 PM Milinda Pointer, MD [facility] None  08/19/2016 10:40 AM Milinda Pointer, MD [facility] None    Primary Care Physician: Lynnell Jude, MD Location: [facility] Note by: Kathlen Brunswick Dossie Arbour, M.D, DABA, DABAPM, DABPM, DABIPP, FIPP  Pain Score Disclaimer: We use the NRS-11 scale. This is a self-reported, subjective measurement of pain severity with only modest accuracy. It is used primarily to identify changes within a particular patient. It must be understood that outpatient pain scales  are significantly less accurate that those used for research, where they can be applied under ideal controlled circumstances with minimal exposure to variables. In reality, the score is likely to be a combination of pain intensity and pain affect, where pain affect describes the degree of emotional arousal or changes in action readiness caused by the sensory experience of pain. Factors such as social and work situation, setting, emotional state, anxiety levels, expectation, and prior pain experience may influence pain perception and show large inter-individual differences that may also be affected by time variables.  Patient instructions provided during this appointment: Patient Instructions   GENERAL RISKS AND COMPLICATIONS  What are the risk, side effects and possible complications? Generally speaking, most procedures are safe.  However, with any procedure there are risks, side effects, and the possibility of complications.  The risks and complications are dependent upon the sites that are lesioned, or the type of nerve  block to be performed.  The closer the procedure is to the spine, the more serious the risks are.  Great care is taken when placing the radio frequency needles, block needles or lesioning probes, but sometimes complications can occur. 1. Infection: Any time there is an injection through the skin, there is a risk of infection.  This is why sterile conditions are used for these blocks.  There are four possible types of infection. 1. Localized skin infection. 2. Central Nervous System Infection-This can be in the form of Meningitis, which can be deadly. 3. Epidural Infections-This can be in the form of an epidural abscess, which can cause pressure inside of the spine, causing compression of the spinal cord with subsequent paralysis. This would require an emergency surgery to decompress, and there are no guarantees that the patient would recover from the paralysis. 4. Discitis-This is an infection of the intervertebral discs.  It occurs in about 1% of discography procedures.  It is difficult to treat and it may lead to surgery.        2. Pain: the needles have to go through skin and soft tissues, will cause soreness.       3. Damage to internal structures:  The nerves to be lesioned may be near blood vessels or    other nerves which can be potentially damaged.       4. Bleeding: Bleeding is more common if the patient is taking blood thinners such as  aspirin, Coumadin, Ticiid, Plavix, etc., or if he/she have some genetic predisposition  such as hemophilia. Bleeding into the spinal canal can cause compression of the spinal  cord with subsequent paralysis.  This would require an emergency surgery to  decompress and there are no guarantees that the patient would recover from the  paralysis.       5. Pneumothorax:  Puncturing of a lung is a possibility, every time a needle is introduced in  the area of the chest or upper back.  Pneumothorax refers to free air around the  collapsed lung(s), inside of the thoracic  cavity (chest cavity).  Another two possible  complications related to a similar event would include: Hemothorax and Chylothorax.   These are variations of the Pneumothorax, where instead of air around the collapsed  lung(s), you may have blood or chyle, respectively.       6. Spinal headaches: They may occur with any procedures in the area of the spine.       7. Persistent CSF (Cerebro-Spinal Fluid) leakage: This is a rare problem, but may occur  with prolonged intrathecal or epidural catheters either due to the formation of a fistulous  track or a dural tear.       8. Nerve damage: By working so close to the spinal cord, there is always a possibility of  nerve damage, which could be as serious as a permanent spinal cord injury with  paralysis.       9. Death:  Although rare, severe deadly allergic reactions known as "Anaphylactic  reaction" can occur to any of the medications used.      10. Worsening of the symptoms:  We can always make thing worse.  What are the chances of something like this happening? Chances of any of this occuring are extremely low.  By statistics, you have more of a chance of getting killed in a motor vehicle accident: while driving to the hospital than any of the above occurring .  Nevertheless, you should be aware that they are possibilities.  In general, it is similar to taking a shower.  Everybody knows that you can slip, hit your head and get killed.  Does that mean that you should not shower again?  Nevertheless always keep in mind that statistics do not mean anything if you happen to be on the wrong side of them.  Even if a procedure has a 1 (one) in a 1,000,000 (million) chance of going wrong, it you happen to be that one..Also, keep in mind that by statistics, you have more of a chance of having something go wrong when taking medications.  Who should not have this procedure? If you are on a blood thinning medication (e.g. Coumadin, Plavix, see list of "Blood Thinners"),  or if you have an active infection going on, you should not have the procedure.  If you are taking any blood thinners, please inform your physician.  How should I prepare for this procedure?  Do not eat or drink anything at least six hours prior to the procedure.  Bring a driver with you .  It cannot be a taxi.  Come accompanied by an adult that can drive you back, and that is strong enough to help you if your legs get weak or numb from the local anesthetic.  Take all of your medicines the morning of the procedure with just enough water to swallow them.  If you have diabetes, make sure that you are scheduled to have your procedure done first thing in the morning, whenever possible.  If you have diabetes, take only half of your insulin dose and notify our nurse that you have done so as soon as you arrive at the clinic.  If you are diabetic, but only take blood sugar pills (oral hypoglycemic), then do not take them on the morning of your procedure.  You may take them after you have had the procedure.  Do not take aspirin or any aspirin-containing medications, at least eleven (11) days prior to the procedure.  They may prolong bleeding.  Wear loose fitting clothing that may be easy to take off and that you would not mind if it got stained with Betadine or blood.  Do not wear any jewelry or perfume  Remove any nail coloring.  It will interfere with some of our monitoring equipment.  NOTE: Remember that this is not meant to be interpreted as a complete list of all possible complications.  Unforeseen problems may occur.  BLOOD THINNERS The following drugs contain aspirin or other products, which can cause increased bleeding during surgery and  should not be taken for 2 weeks prior to and 1 week after surgery.  If you should need take something for relief of minor pain, you may take acetaminophen which is found in Tylenol,m Datril, Anacin-3 and Panadol. It is not blood thinner. The products  listed below are.  Do not take any of the products listed below in addition to any listed on your instruction sheet.  A.P.C or A.P.C with Codeine Codeine Phosphate Capsules #3 Ibuprofen Ridaura  ABC compound Congesprin Imuran rimadil  Advil Cope Indocin Robaxisal  Alka-Seltzer Effervescent Pain Reliever and Antacid Coricidin or Coricidin-D  Indomethacin Rufen  Alka-Seltzer plus Cold Medicine Cosprin Ketoprofen S-A-C Tablets  Anacin Analgesic Tablets or Capsules Coumadin Korlgesic Salflex  Anacin Extra Strength Analgesic tablets or capsules CP-2 Tablets Lanoril Salicylate  Anaprox Cuprimine Capsules Levenox Salocol  Anexsia-D Dalteparin Magan Salsalate  Anodynos Darvon compound Magnesium Salicylate Sine-off  Ansaid Dasin Capsules Magsal Sodium Salicylate  Anturane Depen Capsules Marnal Soma  APF Arthritis pain formula Dewitt's Pills Measurin Stanback  Argesic Dia-Gesic Meclofenamic Sulfinpyrazone  Arthritis Bayer Timed Release Aspirin Diclofenac Meclomen Sulindac  Arthritis pain formula Anacin Dicumarol Medipren Supac  Analgesic (Safety coated) Arthralgen Diffunasal Mefanamic Suprofen  Arthritis Strength Bufferin Dihydrocodeine Mepro Compound Suprol  Arthropan liquid Dopirydamole Methcarbomol with Aspirin Synalgos  ASA tablets/Enseals Disalcid Micrainin Tagament  Ascriptin Doan's Midol Talwin  Ascriptin A/D Dolene Mobidin Tanderil  Ascriptin Extra Strength Dolobid Moblgesic Ticlid  Ascriptin with Codeine Doloprin or Doloprin with Codeine Momentum Tolectin  Asperbuf Duoprin Mono-gesic Trendar  Aspergum Duradyne Motrin or Motrin IB Triminicin  Aspirin plain, buffered or enteric coated Durasal Myochrisine Trigesic  Aspirin Suppositories Easprin Nalfon Trillsate  Aspirin with Codeine Ecotrin Regular or Extra Strength Naprosyn Uracel  Atromid-S Efficin Naproxen Ursinus  Auranofin Capsules Elmiron Neocylate Vanquish  Axotal Emagrin Norgesic Verin  Azathioprine Empirin or Empirin with  Codeine Normiflo Vitamin E  Azolid Emprazil Nuprin Voltaren  Bayer Aspirin plain, buffered or children's or timed BC Tablets or powders Encaprin Orgaran Warfarin Sodium  Buff-a-Comp Enoxaparin Orudis Zorpin  Buff-a-Comp with Codeine Equegesic Os-Cal-Gesic   Buffaprin Excedrin plain, buffered or Extra Strength Oxalid   Bufferin Arthritis Strength Feldene Oxphenbutazone   Bufferin plain or Extra Strength Feldene Capsules Oxycodone with Aspirin   Bufferin with Codeine Fenoprofen Fenoprofen Pabalate or Pabalate-SF   Buffets II Flogesic Panagesic   Buffinol plain or Extra Strength Florinal or Florinal with Codeine Panwarfarin   Buf-Tabs Flurbiprofen Penicillamine   Butalbital Compound Four-way cold tablets Penicillin   Butazolidin Fragmin Pepto-Bismol   Carbenicillin Geminisyn Percodan   Carna Arthritis Reliever Geopen Persantine   Carprofen Gold's salt Persistin   Chloramphenicol Goody's Phenylbutazone   Chloromycetin Haltrain Piroxlcam   Clmetidine heparin Plaquenil   Cllnoril Hyco-pap Ponstel   Clofibrate Hydroxy chloroquine Propoxyphen         Before stopping any of these medications, be sure to consult the physician who ordered them.  Some, such as Coumadin (Warfarin) are ordered to prevent or treat serious conditions such as "deep thrombosis", "pumonary embolisms", and other heart problems.  The amount of time that you may need off of the medication may also vary with the medication and the reason for which you were taking it.  If you are taking any of these medications, please make sure you notify your pain physician before you undergo any procedures.

## 2016-08-06 ENCOUNTER — Ambulatory Visit: Payer: Medicare Other | Admitting: Pain Medicine

## 2016-08-08 ENCOUNTER — Ambulatory Visit: Payer: Medicare Other | Admitting: Pain Medicine

## 2016-08-15 ENCOUNTER — Encounter: Payer: Self-pay | Admitting: Pain Medicine

## 2016-08-15 ENCOUNTER — Ambulatory Visit (HOSPITAL_BASED_OUTPATIENT_CLINIC_OR_DEPARTMENT_OTHER): Payer: Medicare Other | Admitting: Pain Medicine

## 2016-08-15 ENCOUNTER — Ambulatory Visit
Admission: RE | Admit: 2016-08-15 | Discharge: 2016-08-15 | Disposition: A | Discharge status: Home / Self Care | Payer: Medicare Other | Source: Ambulatory Visit | Attending: Pain Medicine | Admitting: Pain Medicine

## 2016-08-15 VITALS — BP 131/78 | HR 67 | Temp 96.5°F | Resp 16 | Ht 59.0 in | Wt 118.0 lb

## 2016-08-15 DIAGNOSIS — E7800 Pure hypercholesterolemia, unspecified: Secondary | ICD-10-CM | POA: Insufficient documentation

## 2016-08-15 DIAGNOSIS — I2510 Atherosclerotic heart disease of native coronary artery without angina pectoris: Secondary | ICD-10-CM | POA: Diagnosis not present

## 2016-08-15 DIAGNOSIS — E559 Vitamin D deficiency, unspecified: Secondary | ICD-10-CM | POA: Insufficient documentation

## 2016-08-15 DIAGNOSIS — J449 Chronic obstructive pulmonary disease, unspecified: Secondary | ICD-10-CM | POA: Insufficient documentation

## 2016-08-15 DIAGNOSIS — Z885 Allergy status to narcotic agent status: Secondary | ICD-10-CM | POA: Diagnosis not present

## 2016-08-15 DIAGNOSIS — F17200 Nicotine dependence, unspecified, uncomplicated: Secondary | ICD-10-CM | POA: Insufficient documentation

## 2016-08-15 DIAGNOSIS — M87821 Other osteonecrosis, right humerus: Secondary | ICD-10-CM | POA: Diagnosis not present

## 2016-08-15 DIAGNOSIS — M47812 Spondylosis without myelopathy or radiculopathy, cervical region: Secondary | ICD-10-CM | POA: Diagnosis not present

## 2016-08-15 DIAGNOSIS — I252 Old myocardial infarction: Secondary | ICD-10-CM | POA: Diagnosis not present

## 2016-08-15 DIAGNOSIS — Z888 Allergy status to other drugs, medicaments and biological substances status: Secondary | ICD-10-CM | POA: Diagnosis not present

## 2016-08-15 DIAGNOSIS — Z79891 Long term (current) use of opiate analgesic: Secondary | ICD-10-CM | POA: Diagnosis not present

## 2016-08-15 DIAGNOSIS — M87021 Idiopathic aseptic necrosis of right humerus: Secondary | ICD-10-CM | POA: Diagnosis not present

## 2016-08-15 DIAGNOSIS — I10 Essential (primary) hypertension: Secondary | ICD-10-CM | POA: Insufficient documentation

## 2016-08-15 DIAGNOSIS — M25512 Pain in left shoulder: Secondary | ICD-10-CM | POA: Insufficient documentation

## 2016-08-15 DIAGNOSIS — M542 Cervicalgia: Secondary | ICD-10-CM | POA: Insufficient documentation

## 2016-08-15 DIAGNOSIS — G8929 Other chronic pain: Secondary | ICD-10-CM | POA: Diagnosis not present

## 2016-08-15 DIAGNOSIS — E119 Type 2 diabetes mellitus without complications: Secondary | ICD-10-CM | POA: Insufficient documentation

## 2016-08-15 DIAGNOSIS — M797 Fibromyalgia: Secondary | ICD-10-CM | POA: Insufficient documentation

## 2016-08-15 DIAGNOSIS — Z9889 Other specified postprocedural states: Secondary | ICD-10-CM

## 2016-08-15 DIAGNOSIS — R5382 Chronic fatigue, unspecified: Secondary | ICD-10-CM | POA: Insufficient documentation

## 2016-08-15 DIAGNOSIS — M25511 Pain in right shoulder: Secondary | ICD-10-CM

## 2016-08-15 MED ORDER — LIDOCAINE HCL (PF) 1 % IJ SOLN
10.0000 mL | Freq: Once | INTRAMUSCULAR | Status: AC
  Administered 2016-08-15: 10 mL
  Filled 2016-08-15: qty 10

## 2016-08-15 MED ORDER — MIDAZOLAM HCL 5 MG/5ML IJ SOLN
1.0000 mg | INTRAMUSCULAR | Status: DC | PRN
  Filled 2016-08-15: qty 5

## 2016-08-15 MED ORDER — METHYLPREDNISOLONE ACETATE 80 MG/ML IJ SUSP
80.0000 mg | Freq: Once | INTRAMUSCULAR | Status: DC
  Administered 2016-08-15: 80 mg

## 2016-08-15 MED ORDER — ROPIVACAINE HCL 2 MG/ML IJ SOLN
9.0000 mL | Freq: Once | INTRAMUSCULAR | Status: AC
  Administered 2016-08-15: 9 mL
  Filled 2016-08-15: qty 10

## 2016-08-15 MED ORDER — LACTATED RINGERS IV SOLN
1000.0000 mL | Freq: Once | INTRAVENOUS | Status: AC
  Administered 2016-08-15: 1000 mL via INTRAVENOUS

## 2016-08-15 MED ORDER — FENTANYL CITRATE (PF) 100 MCG/2ML IJ SOLN
25.0000 ug | INTRAMUSCULAR | Status: DC | PRN
  Filled 2016-08-15: qty 2

## 2016-08-15 NOTE — Progress Notes (Signed)
Patient's Name: Michelle Newton  MRN: [id number]  Referring Provider: Lynnell Jude, MD  DOB: 1961-05-10  PCP: Lynnell Jude, MD  DOS: 08/15/2016  Note by: Kathlen Brunswick. Dossie Arbour, MD  Service setting: Ambulatory outpatient  Location: [facility] (AMB) Pain Management Facility  Visit type: Procedure  Specialty: Interventional Pain Management  Patient type: Established   Primary Reason for Visit: Interventional Pain Management Treatment. CC: Neck Pain and Shoulder Pain (bilateral)  Procedure:  Anesthesia, Analgesia, Anxiolysis:  Type: Therapeutic Suprascapular nerve  Block, without steroids. Region: Posterior Shoulder Area Level: Shoulder Laterality: Bilateral  Type: Moderate (Conscious) Sedation & Local Anesthesia Local Anesthetic: Lidocaine 1% Route: Intravenous (IV) IV Access: Secured Sedation: Meaningful verbal contact was maintained at all times during the procedure  Indication(s): Analgesia & Anxiolysis   Indications: 1. Chronic pain of shoulders (B) ((R>L)   2. 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 Right shoulder: Chronic Pain   4. S/P shoulder surgery (05/24/2016) (Right)   5. Chronic pain    Pre-procedure Pain Score: 8/10 Post-procedure Pain Score: 3 /10  Pre-Procedure Assessment  Michelle Newton is a 55 y.o. year old, female patient, seen today for interventional treatment. She has Encounter for therapeutic drug level monitoring; Encounter for long-term use of opiate analgesic; Opiate use (37.5 MME/Day); Cervical facet syndrome; Cervical spondylosis; Chronic neck pain (Location of Secondary source of pain) (Bilateral) (L>R); Chronic hip pain (Left); Lumbar facet syndrome; Lumbar spondylosis; Failed back surgical syndrome; Chronic low back pain; Fibromyalgia; Chronic pain syndrome; Bladder cystocele; H/O acute myocardial infarction; Right shoulder: S/P Rotator cuff repair; Chronic thumb pain (Bilateral); Upper extremity pain; Occipital neuralgia (Bilateral); Smoker;  Generalized anxiety disorder; Depression; Wrist arthritis; Fracture five ribs-closed; History of seizures; History of migraine; H/O non-insulin dependent diabetes mellitus; Essential hypertension; High cholesterol; Coronary artery disease; History of cardiac arrhythmia; H/O vertigo; Vitamin D insufficiency; Chronic cervical radicular pain (Left); Chronic fatigue syndrome; Family history of chronic pain; History of knee surgery; History of elbow surgery; Long term current use of opiate analgesic; Long term prescription opiate use; Opioid dependence, daily use ([place]); Chronic pain; Right shoulder:  Rotator cuff Disorder; Abnormal MRI, shoulder (Right); Right shoulder: Adhesive capsulitis; Right shoulder: Torn & retracted Infraspinatus tendon; Right shoulder: Torn & retracted Supraspinatus tendon (Complete, posterior full-thickness tear); Right shoulder: Subscapularis tendinopathy; Right shoulder: Biceps tendinopathy; 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 Her primarily concern today is the Neck Pain and Shoulder Pain (bilateral)  Pain Type: Chronic pain Pain Location: Neck (shoulders) Pain Orientation: Right, Left Pain Descriptors / Indicators: Constant Pain Frequency: Constant  Date of Last Visit: 07/23/16 Service Provided on Last Visit: Med Refill  Coagulation Parameters Lab Results  Component Value Date   PLT 309 12/22/2015   Verification of the correct person, correct site (including marking of site), and correct procedure were performed and confirmed by the patient.  Consent: Secured. Under the influence of no sedatives a written informed consent was obtained, after having provided information on the risks and  possible complications. To fulfill our ethical and legal obligations, as recommended by the American Medical Association's Code of Ethics, we have provided information to the patient about our clinical impression; the nature and purpose of the treatment or procedure; the risks, benefits, and possible complications of the intervention;  alternatives; the risk(s) and benefit(s) of the alternative treatment(s) or procedure(s); and the risk(s) and benefit(s) of doing nothing. The patient was provided information about the risks and possible complications associated with the procedure. These include, but are not limited to, failure to achieve desired goals, infection, bleeding, organ or nerve damage, allergic reactions, paralysis, and death. The patient was also informed about the complications specifically associated with intra-articular injections, which include, but is not limited to: subcutaneous lipoatrophy, subcutaneous tissue atrophy, skin depigmentation (8.3%), intra-articular calcifications (4.9%), intra-articular tendon rupture, hemarthrosis (bleeding into joint), joint infection, suppression of hypothalamic-pituitary axis lasting about two weeks, increase in blood glucose with impaired glucose control in diabetic patients, iatrogenic infections (0.0004%), acute flare-up of the pain (pseudoseptic reactions) (11%), crystal-proven pseudogout, iatrogenic septic arthritis (0.0005), injection site pain, and failure to provide relief. In addition, the patient was informed that Medicine is not an exact science; therefore, there is also the possibility of unforeseen risks and possible complications that may result in a catastrophic outcome. The patient indicated having understood very clearly. We have given the patient no guarantees and we have made no promises. Enough time was given to the patient to ask questions, all of which were answered to the patient's satisfaction.  Consent Attestation: I, the ordering  provider, attest that I have discussed with the patient the benefits, risks, side-effects, alternatives, likelihood of achieving goals, and potential problems during recovery for the procedure that I have provided informed consent.  Pre-Procedure Preparation: Safety Precautions: Allergies reviewed. Appropriate site, procedure, and patient were confirmed by following the Joint Commission's Universal Protocol (UP.01.01.01), in the form of a "Time Out". The patient was asked to confirm marked site and procedure, before commencing. The patient was asked about blood thinners, or active infections, both of which were denied. Patient was assessed for positional comfort and all pressure points were checked before starting procedure. Infection Control Precautions: Sterile technique used. Standard Universal Precautions were taken as recommended by the Department of [facility] for Disease Control and Prevention (CDC). Standard pre-surgical skin prep was conducted. Respiratory hygiene and cough etiquette was practiced. Hand hygiene observed. Safe injection practices and needle disposal techniques followed. SDV (single dose vial) medications used. Medications properly checked for expiration dates and contaminants. Personal protective equipment (PPE) used: Sterile Radiation-resistant gloves. Monitoring:  As per clinic protocol. Vitals:   08/15/16 1410 08/15/16 1420 08/15/16 1430 08/15/16 1435  BP: 130/75 122/73 (!) 125/99 131/78  Pulse: 65 60 69 67  Resp: 13 14 14 16   Temp:  (!) 96.5 F (35.8 C)    SpO2: 93% 97% 97% 96%  Weight:      Height:      Calculated BMI: Body mass index is 23.83 kg/m. Allergies: She is allergic to cymbalta [duloxetine hcl]; duloxetine; flagyl [metronidazole]; lyrica [pregabalin]; vicodin [hydrocodone-acetaminophen]; nsaids; and tape.. Allergy Precautions: None required  Description of Procedure Process:   Time-out: "Time-out" completed before starting procedure, as per  protocol. Position: Prone", Target Area: Suprascapular notch. Approach: Posterior approach. Area Prepped: Entire shoulder Area Prepping solution: ChloraPrep (2% chlorhexidine gluconate and 70% isopropyl alcohol) Safety Precautions: Aspiration looking for blood return was conducted prior to all injections. At no point did we inject any substances, as a needle was being advanced. No attempts were made at seeking any paresthesias. Safe injection practices and needle disposal techniques used. Medications properly checked for expiration dates. SDV (single dose vial) medications used. Description of the Procedure: Protocol guidelines were followed. The patient was placed in position over  the procedure table. The target area was identified and the area prepped in the usual manner. Skin & deeper tissues infiltrated with local anesthetic. Appropriate amount of time allowed to pass for local anesthetics to take effect. The procedure needles were then advanced to the target area. Proper needle placement secured. Negative aspiration confirmed. Solution injected in intermittent fashion, asking for systemic symptoms every 0.5cc of injectate. The needles were then removed and the area cleansed, making sure to leave some of the prepping solution back to take advantage of its long term bactericidal properties. EBL: None Materials & Medications Used:  Needle(s) Used: 22g - 3.5" Spinal Needle(s) Solution Injected: 0.2% PF-Ropivacaine (68m) x 2. (No steroids) Medications Administered today: We administered lactated ringers, ropivacaine (PF) 2 mg/ml (0.2%), and lidocaine (PF).Please see chart orders for dosing details.  Imaging Guidance:  Type of Imaging Technique: Fluoroscopy Guidance (Non-spinal) Indication(s): Assistance in needle guidance and placement for procedures requiring needle placement in or near specific anatomical locations not easily accessible without such assistance. Exposure Time: Please see nurses  notes. Contrast: None used. Fluoroscopic Guidance: I was personally present in the fluoroscopy suite, where the patient was placed in position for the procedure, over the fluoroscopy-compatible table. Fluoroscopy was manipulated, using "Tunnel Vision Technique", to obtain the best possible view of the target area, on the affected side. Parallax error was corrected before commencing the procedure. A "direction-depth-direction" technique was used to introduce the needle under continuous pulsed fluoroscopic guidance. Once the target was reached, antero-posterior, oblique, and lateral fluoroscopic projection views were taken to confirm needle placement in all planes. Permanently recorded images stored by scanning into EMR. Interpretation: No contrast injected. Intraoperative imaging interpretation by performing Physician. Adequate needle placement confirmed. Permanent hardcopy images in multiple planes scanned into the patient's record.  Antibiotic Prophylaxis:  Indication(s): No indications identified. Type:  Antibiotics Given (last 72 hours)    None       Post-operative Assessment:  Complications: No immediate post-treatment complications were observed. Disposition: The patient was discharged home, once institutional criteria were met. Return to clinic in 2 weeks for follow-up evaluation and interpretation of results. The patient tolerated the entire procedure well. A repeat set of vitals were taken after the procedure and the patient was kept under observation following institutional policy, for this type of procedure. Post-procedural neurological assessment was performed, showing return to baseline, prior to discharge. The patient was provided with post-procedure discharge instructions, including a section on how to identify potential problems. Should any problems arise concerning this procedure, the patient was given instructions to immediately contact [place] at any time, without hesitation. In any  case, we plan to contact the patient by telephone for a follow-up status report regarding this interventional procedure. Comments:  No additional relevant information.  Plan of Care   Problem List Items Addressed This Visit      High   Chronic pain (Chronic)   Relevant Medications   fentaNYL (SUBLIMAZE) injection 25-50 mcg   ropivacaine (PF) 2 mg/ml (0.2%) (NAROPIN) epidural 9 mL (Completed)   lidocaine (PF) (XYLOCAINE) 1 % injection 10 mL (Completed)   oxyCODONE-acetaminophen (PERCOCET/ROXICET) 5-325 MG tablet   Chronic pain of shoulders (Location of Primary Source of Pain) (Bilateral) ((R>L) - Primary (Chronic)   Relevant Medications   fentaNYL (SUBLIMAZE) injection 25-50 mcg   lactated ringers infusion 1,000 mL (Completed)   midazolam (VERSED) 5 MG/5ML injection 1-2 mg   ropivacaine (PF) 2 mg/ml (0.2%) (NAROPIN) epidural 9 mL (Completed)   lidocaine (PF) (XYLOCAINE) 1 %  injection 10 mL (Completed)   Other Relevant Orders   SUPRASCAPULAR NERVE BLOCK   DG C-Arm 1-60 Min-No Report (Completed)   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  Future Appointments Date Time Provider [place]  08/19/2016 10:40 AM Milinda Pointer, MD [facility] None  09/23/2016 11:00 AM Milinda Pointer, MD [facility] None    Primary Care Physician: Lynnell Jude, MD Location: [facility] Note by: Kathlen Brunswick. Dossie Arbour, M.D, DABA, DABAPM, DABPM, DABIPP, FIPP  Disclaimer:  Medicine is not an exact science. The only guarantee in medicine is that nothing is guaranteed. It is important to note that the decision to proceed with this intervention was based on the information collected from the patient. The Data and conclusions  were drawn from the patient's questionnaire, the interview, and the physical examination. Because the information was provided in large part by the patient, it cannot be guaranteed that it has not been purposely or unconsciously manipulated. Every effort has been made to obtain as much relevant data as possible for this evaluation. It is important to note that the conclusions that lead to this procedure are derived in large part from the available data. Always take into account that the treatment will also be dependent on availability of resources and existing treatment guidelines, considered by other Pain Management Practitioners as being common knowledge and practice, at the time of the intervention. For Medico-Legal purposes, it is also important to point out that variation in procedural techniques and pharmacological choices are the acceptable norm. The indications, contraindications, technique, and results of the above procedure should only be interpreted and judged by a Board-Certified Interventional Pain Specialist with extensive familiarity and expertise in the same exact procedure and technique. Attempts at providing opinions without similar or greater experience and expertise than that of the treating physician will be considered as inappropriate and unethical, and shall result in a formal complaint to the state medical board and applicable specialty societies.

## 2016-08-15 NOTE — Patient Instructions (Addendum)
Smoking Cessation, Tips for Success If you are ready to quit smoking, congratulations! You have chosen to help yourself be healthier. Cigarettes bring nicotine, tar, carbon monoxide, and other irritants into your body. Your lungs, heart, and blood vessels will be able to work better without these poisons. There are many different ways to quit smoking. Nicotine gum, nicotine patches, a nicotine inhaler, or nicotine nasal spray can help with physical craving. Hypnosis, support groups, and medicines help break the habit of smoking. WHAT THINGS CAN I DO TO MAKE QUITTING EASIER?  Here are some tips to help you quit for good:  Pick a date when you will quit smoking completely. Tell all of your friends and family about your plan to quit on that date.  Do not try to slowly cut down on the number of cigarettes you are smoking. Pick a quit date and quit smoking completely starting on that day.  Throw away all cigarettes.   Clean and remove all ashtrays from your home, work, and car.  On a card, write down your reasons for quitting. Carry the card with you and read it when you get the urge to smoke.  Cleanse your body of nicotine. Drink enough water and fluids to keep your urine clear or pale yellow. Do this after quitting to flush the nicotine from your body.  Learn to predict your moods. Do not let a bad situation be your excuse to have a cigarette. Some situations in your life might tempt you into wanting a cigarette.  Never have "just one" cigarette. It leads to wanting another and another. Remind yourself of your decision to quit.  Change habits associated with smoking. If you smoked while driving or when feeling stressed, try other activities to replace smoking. Stand up when drinking your coffee. Brush your teeth after eating. Sit in a different chair when you read the paper. Avoid alcohol while trying to quit, and try to drink fewer caffeinated beverages. Alcohol and caffeine may urge you to  smoke.  Avoid foods and drinks that can trigger a desire to smoke, such as sugary or spicy foods and alcohol.  Ask people who smoke not to smoke around you.  Have something planned to do right after eating or having a cup of coffee. For example, plan to take a walk or exercise.  Try a relaxation exercise to calm you down and decrease your stress. Remember, you may be tense and nervous for the first 2 weeks after you quit, but this will pass.  Find new activities to keep your hands busy. Play with a pen, coin, or rubber band. Doodle or draw things on paper.  Brush your teeth right after eating. This will help cut down on the craving for the taste of tobacco after meals. You can also try mouthwash.   Use oral substitutes in place of cigarettes. Try using lemon drops, carrots, cinnamon sticks, or chewing gum. Keep them handy so they are available when you have the urge to smoke.  When you have the urge to smoke, try deep breathing.  Designate your home as a nonsmoking area.  If you are a heavy smoker, ask your health care provider about a prescription for nicotine chewing gum. It can ease your withdrawal from nicotine.  Reward yourself. Set aside the cigarette money you save and buy yourself something nice.  Look for support from others. Join a support group or smoking cessation program. Ask someone at home or at work to help you with your plan   to quit smoking.  Always ask yourself, "Do I need this cigarette or is this just a reflex?" Tell yourself, "Today, I choose not to smoke," or "I do not want to smoke." You are reminding yourself of your decision to quit.  Do not replace cigarette smoking with electronic cigarettes (commonly called e-cigarettes). The safety of e-cigarettes is unknown, and some may contain harmful chemicals.  If you relapse, do not give up! Plan ahead and think about what you will do the next time you get the urge to smoke. HOW WILL I FEEL WHEN I QUIT SMOKING? You  may have symptoms of withdrawal because your body is used to nicotine (the addictive substance in cigarettes). You may crave cigarettes, be irritable, feel very hungry, cough often, get headaches, or have difficulty concentrating. The withdrawal symptoms are only temporary. They are strongest when you first quit but will go away within 10-14 days. When withdrawal symptoms occur, stay in control. Think about your reasons for quitting. Remind yourself that these are signs that your body is healing and getting used to being without cigarettes. Remember that withdrawal symptoms are easier to treat than the major diseases that smoking can cause.  Even after the withdrawal is over, expect periodic urges to smoke. However, these cravings are generally short lived and will go away whether you smoke or not. Do not smoke! WHAT RESOURCES ARE AVAILABLE TO HELP ME QUIT SMOKING? Your health care provider can direct you to community resources or hospitals for support, which may include:  Group support.  Education.  Hypnosis.  Therapy.   This information is not intended to replace advice given to you by your health care provider. Make sure you discuss any questions you have with your health care provider.   Document Released: 08/16/2004 Document Revised: 12/09/2014 Document Reviewed: 05/06/2013 Elsevier Interactive Patient Education 2016 [employer]  Trigger Point Injection Trigger points are areas where you have muscle pain. A trigger point injection is a shot given in the trigger point to relieve that pain. A trigger point might feel like a knot in your muscle. It hurts to press on a trigger point. Sometimes the pain spreads out (radiates) to other parts of the body. For example, pressing on a trigger point in your shoulder might cause pain in your arm or neck. You might have one trigger point. Or, you might have more than one. People often have trigger points in their upper back and lower back. They also  occur often in the neck and shoulders. Pain from a trigger point lasts for a long time. It can make it hard to keep moving. You might not be able to do the exercise or physical therapy that could help you deal with the pain. A trigger point injection may help. It does not work for everyone. But, it may relieve your pain for a few days or a few months. A trigger point injection does not cure long-lasting (chronic) pain. LET YOUR CAREGIVER KNOW ABOUT:  Any allergies (especially to latex, lidocaine, or steroids).  Blood-thinning medicines that you take. These drugs can lead to bleeding or bruising after an injection. They include:  Aspirin.  Ibuprofen.  Clopidogrel.  Warfarin.  Other medicines you take. This includes all vitamins, herbs, eyedrops, over-the-counter medicines, and creams.  Use of steroids.  Recent infections.  Past problems with numbing medicines.  Bleeding problems.  Surgeries you have had.  Other health problems. RISKS AND COMPLICATIONS A trigger point injection is a safe treatment. However, problems may   develop, such as:  Minor side effects usually go away in 1 to 2 days. These may include:  Soreness.  Bruising.  Stiffness.  More serious problems are rare. But, they may include:  Bleeding under the skin (hematoma).  Skin infection.  Breaking off of the needle under your skin.  Lung puncture.  The trigger point injection may not work for you. BEFORE THE PROCEDURE You may need to stop taking any medicine that thins your blood. This is to prevent bleeding and bruising. Usually these medicines are stopped several days before the injection. No other preparation is needed. PROCEDURE  A trigger point injection can be given in your caregiver's office or in a clinic. Each injection takes 2 minutes or less.  Your caregiver will feel for trigger points. The caregiver may use a marker to circle the area for the injection.  The skin over the trigger point  will be washed with a germ-killing (antiseptic) solution.  The caregiver pinches the spot for the injection.  Then, a very thin needle is used for the shot. You may feel pain or a twitching feeling when the needle enters the trigger point.  A numbing solution may be injected into the trigger point. Sometimes a drug to keep down swelling, redness, and warmth (inflammation) is also injected.  Your caregiver moves the needle around the trigger zone until the tightness and twitching goes away.  After the injection, your caregiver may put gentle pressure over the injection site.  Then it is covered with a bandage. AFTER THE PROCEDURE  You can go right home after the injection.  The bandage can be taken off after a few hours.  You may feel sore and stiff for 1 to 2 days.  Go back to your regular activities slowly. Your caregiver may ask you to stretch your muscles. Do not do anything that takes extra energy for a few days.  Follow your caregiver's instructions to manage and treat other pain.   This information is not intended to replace advice given to you by your health care provider. Make sure you discuss any questions you have with your health care provider.   Document Released: 11/07/2011 Document Revised: 03/15/2013 Document Reviewed: 11/07/2011 Elsevier Interactive Patient Education 2016 [employer] Pain Management Discharge Instructions  General Discharge Instructions :  If you need to reach your doctor call: Monday-Friday 8:00 am - 4:00 pm at [phone] or toll free [phone].  After clinic hours [phone] to have operator reach doctor.  Bring all of your medication bottles to all your appointments in the pain clinic.  To cancel or reschedule your appointment with Pain Management please remember to call 24 hours in advance to avoid a fee.  Refer to the educational materials which you have been given on: General Risks, I had my Procedure. Discharge Instructions,  Post Sedation.  Post Procedure Instructions:  The drugs you were given will stay in your system until tomorrow, so for the next 24 hours you should not drive, make any legal decisions or drink any alcoholic beverages.  You may eat anything you prefer, but it is better to start with liquids then soups and crackers, and gradually work up to solid foods.  Please notify your doctor immediately if you have any unusual bleeding, trouble breathing or pain that is not related to your normal pain.  Depending on the type of procedure that was done, some parts of your body may feel week and/or numb.  This usually clears up by tonight or   the next day.  Walk with the use of an assistive device or accompanied by an adult for the 24 hours.  You may use ice on the affected area for the first 24 hours.  Put ice in a Ziploc bag and cover with a towel and place against area 15 minutes on 15 minutes off.  You may switch to heat after 24 hours. 

## 2016-08-15 NOTE — Progress Notes (Signed)
Safety precautions to be maintained throughout the outpatient stay will include: orient to surroundings, keep bed in low position, maintain call bell within reach at all times, provide assistance with transfer out of bed and ambulation.  

## 2016-08-16 ENCOUNTER — Telehealth: Payer: Self-pay | Admitting: *Deleted

## 2016-08-16 NOTE — Telephone Encounter (Signed)
Called patient number twice with no answer and no way to leave voicemail.

## 2016-08-19 ENCOUNTER — Ambulatory Visit: Payer: Medicare Other | Attending: Pain Medicine | Admitting: Pain Medicine

## 2016-08-19 ENCOUNTER — Telehealth: Payer: Self-pay | Admitting: *Deleted

## 2016-08-19 VITALS — BP 124/78 | HR 81 | Temp 97.8°F | Resp 16 | Ht 59.0 in | Wt 118.0 lb

## 2016-08-19 DIAGNOSIS — E7800 Pure hypercholesterolemia, unspecified: Secondary | ICD-10-CM | POA: Insufficient documentation

## 2016-08-19 DIAGNOSIS — G8929 Other chronic pain: Secondary | ICD-10-CM | POA: Diagnosis not present

## 2016-08-19 DIAGNOSIS — Z7951 Long term (current) use of inhaled steroids: Secondary | ICD-10-CM | POA: Insufficient documentation

## 2016-08-19 DIAGNOSIS — I10 Essential (primary) hypertension: Secondary | ICD-10-CM | POA: Diagnosis not present

## 2016-08-19 DIAGNOSIS — Z888 Allergy status to other drugs, medicaments and biological substances status: Secondary | ICD-10-CM | POA: Insufficient documentation

## 2016-08-19 DIAGNOSIS — M25511 Pain in right shoulder: Secondary | ICD-10-CM | POA: Diagnosis not present

## 2016-08-19 DIAGNOSIS — I2510 Atherosclerotic heart disease of native coronary artery without angina pectoris: Secondary | ICD-10-CM | POA: Diagnosis not present

## 2016-08-19 DIAGNOSIS — Z885 Allergy status to narcotic agent status: Secondary | ICD-10-CM | POA: Insufficient documentation

## 2016-08-19 DIAGNOSIS — I252 Old myocardial infarction: Secondary | ICD-10-CM | POA: Diagnosis not present

## 2016-08-19 DIAGNOSIS — M545 Low back pain: Secondary | ICD-10-CM | POA: Diagnosis not present

## 2016-08-19 DIAGNOSIS — J449 Chronic obstructive pulmonary disease, unspecified: Secondary | ICD-10-CM | POA: Diagnosis not present

## 2016-08-19 DIAGNOSIS — M25512 Pain in left shoulder: Secondary | ICD-10-CM | POA: Diagnosis not present

## 2016-08-19 DIAGNOSIS — Z79891 Long term (current) use of opiate analgesic: Secondary | ICD-10-CM | POA: Diagnosis not present

## 2016-08-19 DIAGNOSIS — Z79899 Other long term (current) drug therapy: Secondary | ICD-10-CM | POA: Insufficient documentation

## 2016-08-19 DIAGNOSIS — M542 Cervicalgia: Secondary | ICD-10-CM | POA: Insufficient documentation

## 2016-08-19 DIAGNOSIS — F1190 Opioid use, unspecified, uncomplicated: Secondary | ICD-10-CM

## 2016-08-19 DIAGNOSIS — M797 Fibromyalgia: Secondary | ICD-10-CM | POA: Insufficient documentation

## 2016-08-19 DIAGNOSIS — Z7982 Long term (current) use of aspirin: Secondary | ICD-10-CM | POA: Diagnosis not present

## 2016-08-19 DIAGNOSIS — R5382 Chronic fatigue, unspecified: Secondary | ICD-10-CM | POA: Insufficient documentation

## 2016-08-19 MED ORDER — CYCLOBENZAPRINE HCL 10 MG PO TABS: 10.0000 mg | ORAL_TABLET | Freq: Three times a day (TID) | ORAL | 0 refills | Status: DC

## 2016-08-19 MED ORDER — OXYCODONE-ACETAMINOPHEN 5-325 MG PO TABS: 1.0000 | ORAL_TABLET | Freq: Every day | ORAL | 0 refills | Status: DC | PRN

## 2016-08-19 NOTE — Telephone Encounter (Signed)
Patient called to state that the Rx that was in question /this afternoon 08/18/16 - 09/17/16 was in fact brought back to the pain clinic and voided on 07/23/16 and scanned into the system. Patient would like me to ask Dr Laban EmperorNaveira to rewrite Rx for this medicine. Since the Rx she received today does not begin until 09/17/16. Message given to Dr Laban EmperorNaveira.

## 2016-08-19 NOTE — Progress Notes (Signed)
Safety precautions to be maintained throughout the outpatient stay will include: orient to surroundings, keep bed in low position, maintain call bell within reach at all times, provide assistance with transfer out of bed and ambulation. Pill count  Is oxycodone 5/325mg  is 4/150 filled on 07/28/2016.

## 2016-08-19 NOTE — Progress Notes (Signed)
Patient's Name: Michelle Newton  MRN: [id number]  Referring Provider: Dortha Kern, MD  DOB: May 12, 1961  PCP: Dortha Kern, MD  DOS: 08/19/2016  Note by: Sydnee Levans. Laban Emperor, MD  Service setting: Ambulatory outpatient  Specialty: Interventional Pain Management  Location: [facility] (AMB) Pain Management Facility    Patient type: Established   Primary Reason(s) for Visit: Encounter for prescription drug management & post-procedure evaluation of chronic illness with mild to moderate exacerbation(Level of risk: moderate) CC: Pain (all over the body)  HPI  [name] is a 55 y.o. year old, female patient, who returns today as an established patient. She has Encounter for therapeutic drug level monitoring; Encounter for long-term use of opiate analgesic; Opiate use (37.5 MME/Day); Cervical facet syndrome; Cervical spondylosis; Chronic neck pain (Location of Secondary source of pain) (Bilateral) (L>R); Chronic hip pain (Left); Lumbar facet syndrome; Lumbar spondylosis; Failed back surgical syndrome; Chronic low back pain; Fibromyalgia; Chronic pain syndrome; Bladder cystocele; H/O acute myocardial infarction; Right shoulder: S/P Rotator cuff repair; Chronic thumb pain (Bilateral); Upper extremity pain; Occipital neuralgia (Bilateral); Smoker; Generalized anxiety disorder; Depression; Wrist arthritis; Fracture five ribs-closed; History of seizures; History of migraine; H/O non-insulin dependent diabetes mellitus; Essential hypertension; High cholesterol; Coronary artery disease; History of cardiac arrhythmia; H/O vertigo; Vitamin D insufficiency; Chronic cervical radicular pain (Left); Chronic fatigue syndrome; Family history of chronic pain; History of knee surgery; History of elbow surgery; Long term current use of opiate analgesic; Long term prescription opiate use; Opioid dependence, daily use (HCC); Chronic pain; Right shoulder:  Rotator cuff Disorder; Abnormal MRI, shoulder (Right); Right shoulder: Adhesive  capsulitis; Right shoulder: Torn & retracted Infraspinatus tendon; Right shoulder: Torn & retracted Supraspinatus tendon (Complete, posterior full-thickness tear); Right shoulder: Subscapularis tendinopathy; Right shoulder: Biceps tendinopathy; Right shoulder: Avascular necrosis of humeral head (HCC); Right shoulder: Subacromial/Subdeltoid bursitis; Acromial fracture; Disturbance of skin sensation; S/P shoulder surgery (05/24/2016) (Right); COPD, moderate (HCC); and Chronic pain of shoulders (Location of Primary Source of Pain) (Bilateral) ((R>L) on her problem list.. Her primarily concern today is the Pain (all over the body)  Pain Assessment: Self-Reported Pain Score: 8  Clinically the patient looks like a 2/10 Reported level is inconsistent with clinical observations. Information on the proper use of the pain score provided to the patient today. Pain Type: Chronic pain Pain Location: Other (Comment) (all over the body( pt has fibromyalgia)) Pain Orientation:  (generalized pain all over the body) Pain Descriptors / Indicators: Aching, Constant, Radiating, Tingling, Numbness, Spasm Pain Frequency: Constant  The patient comes into the clinics today for post-procedure evaluation on the interventional treatment done on 08/15/2016. In addition, she comes in today for pharmacological management of her chronic pain.  The patient  reports that she does not use drugs.  Date of Last Visit: 08/15/16 (bilateral suprascapular nerve block) Service Provided on Last Visit: Procedure  Controlled Substance Pharmacotherapy Assessment & REMS (Risk Evaluation and Mitigation Strategy)  Analgesic: Oxycodone/APAP 5/325 one tablet 5 times a day (25 mg/day) MME/day: 37.5 mg/day.  Pill Count: oxycodone 5/325mg  is 4/150 filled on 07/28/2016. Pharmacokinetics: Onset of action (Liberation/Absorption): Within expected pharmacological parameters Time to Peak effect (Distribution): Timing and results are as within normal  expected parameters Duration of action (Metabolism/Excretion): Within normal limits for medication Pharmacodynamics: Analgesic Effect: More than 50% Activity Facilitation: Medication(s) allow patient to sit, stand, walk, and do the basic ADLs Perceived Effectiveness: Described as relatively effective, allowing for increase in activities of daily living (ADL) Side-effects or Adverse reactions: None reported  Monitoring: [place] PMP: Online review of the past 74-month period conducted. Compliant with practice rules and regulations Last UDS on record: ToxAssure Select 13  Date Value Ref Range Status  06/05/2016 FINAL  Final    Comment:    ==================================================================== TOXASSURE SELECT 13 (MW) ==================================================================== Test                             Result       Flag       Units Drug Present and Declared for Prescription Verification   Desmethyldiazepam              1755         EXPECTED   ng/mg creat   Oxazepam                       2121         EXPECTED   ng/mg creat   Temazepam                      1539         EXPECTED   ng/mg creat    Desmethyldiazepam, oxazepam, and temazepam are expected    metabolites of diazepam. Desmethyldiazepam and oxazepam are also    expected metabolites of other drugs, including chlordiazepoxide,    prazepam, clorazepate, and halazepam. Oxazepam is an expected    metabolite of temazepam. Oxazepam and temazepam are also    available as scheduled prescription medications.   Oxycodone                      8668         EXPECTED   ng/mg creat   Oxymorphone                    6658         EXPECTED   ng/mg creat   Noroxycodone                   16426        EXPECTED   ng/mg creat   Noroxymorphone                 516          EXPECTED   ng/mg creat    Sources of oxycodone are scheduled prescription medications.    Oxymorphone, noroxycodone, and noroxymorphone are expected    metabolites  of oxycodone. Oxymorphone is also available as a    scheduled prescription medication. Drug Absent but Declared for Prescription Verification   Butalbital                     Not Detected UNEXPECTED ==================================================================== Test                      Result    Flag   Units      Ref Range   Creatinine              38               mg/dL      >=16 ==================================================================== Declared Medications:  The flagging and interpretation on this report are based on the  following declared medications.  Unexpected results may arise from  inaccuracies in the declared medications.  **Note: The testing scope of this panel includes these medications:  Butalbital (Butalbital/APAP/Caffeine)  Diazepam (Valium)  Oxycodone (Oxycodone Acetaminophen)  **Note: The testing scope of this panel does not include following  reported medications:  Acetaminophen (Butalbital/APAP/Caffeine)  Acetaminophen (Oxycodone Acetaminophen)  Albuterol (Combivent)  Aspirin  Bisoprolol (Ziac)  Caffeine (Butalbital/APAP/Caffeine)  Cyclobenzaprine (Flexeril)  Dicyclomine (Bentyl)  Estradiol  Fluticasone (Advair)  Hydrochlorothiazide (Ziac)  Ipratropium (Combivent)  Magnesium  Methylprednisolone (Medrol Dose Pack)  Nitroglycerin  Paroxetine  Potassium  Promethazine  Rosuvastatin (Crestor)  Salmeterol (Advair)  Supplement  Supplement (Probiotic)  Vitamin B12  Vitamin D  Vitamin E ==================================================================== For clinical consultation, please call [phone]. ====================================================================    UDS interpretation: Compliant Patient informed of the CDC guidelines and recommendations to stay away from the concomitant use of benzodiazepines and opioids due to the increased risk of respiratory depression and death. Medication Assessment Form: Reviewed. Patient  indicates being compliant with therapy Treatment compliance: Compliant Risk Assessment: Aberrant Behavior: None observed today Substance Use Disorder (SUD) Risk Level: Very High Risk of opioid abuse or dependence: 0.7-3.0% with doses ? 36 MME/day and 6.1-26% with doses ? 120 MME/day. Opioid Risk Tool (ORT) Score: Total Score: 5 Moderate Risk for SUD (Score between 4-7) Depression Scale Score: PHQ-2: PHQ-2 Total Score: 0 No depression (0) PHQ-9: PHQ-9 Total Score: 0 No depression (0-4)  Pharmacologic Plan: No change in therapy, at this time  Post-Procedure Assessment  Procedure done on 08/15/2016: Diagnostic bilateral suprascapular nerve block under fluoroscopic guidance and IV sedation.(No steroids) Complications experienced at the time of the procedure: None Side-effects or Adverse reactions: None reported Sedation: Sedation provided. When no sedatives are used, the analgesic levels obtained are directly associated with the effectiveness of the local anesthetics. On the other hand, when sedation is provided, the level of analgesia obtained during the initial 1 hour immediately following the intervention, is believed to be the result of a combination of factors. These factors may include, but are not limited to: 1. The effectiveness of the local anesthetics used. 2. The effects of the analgesic(s) and/or anxiolytic(s) used. 3. The degree of discomfort experienced by the patient at the time of the procedure. 4. The patients ability and reliability in recalling and recording the events. 5. The presence and influence of possible secondary gains. Results: Relief during the 1st hour after the procedure: 100 % (Ultra-Short Term Relief) Interpretation note: Analgesia during this period is likely to be Local Anesthetic and/or IV Sedative (Analgesic/Anxiolitic) related Local Anesthesia: Long-acting (4-6 hours) anesthetics used. The analgesic levels attained during this period are directly associated  to the localized infiltration of local anesthetics and therefore cary significant diagnostic value as to the etiological location or origin of the pain. Results: Relief during the next 4 to 6 hour after the procedure: 100 % (Short Term Relief) Interpretation note: Complete relief confirms area to be the source of pain Long-Term Therapy: Steroids used. Results: Extended relief following procedure: 0 % (no relief to shoulders, neck area) Interpretation note: Long-term benefit would suggest an inflammatory etiology to the pain         Long-Term Benefits:  Current Relief (Now): 0%  Interpretation note: Persistent relief would suggest effective anti-inflammatory effects from steroids Interpretation of Results: The results of this diagnostic bilateral suprascapular nerve block would suggest that doing radiofrequency may provide her with long-term benefits.  Laboratory Chemistry  Inflammation Markers Lab Results  Component Value Date   ESRSEDRATE 13 06/06/2016   CRP 1.1 (H) 06/06/2016   Renal Function Lab Results  Component Value Date   BUN 12 06/06/2016   CREATININE  0.85 06/06/2016   GFRAA >60 06/06/2016   GFRNONAA >60 06/06/2016   Hepatic Function Lab Results  Component Value Date   AST 21 06/06/2016   ALT 17 06/06/2016   ALBUMIN 3.7 06/06/2016   Electrolytes Lab Results  Component Value Date   NA 142 06/06/2016   K 3.3 (L) 06/06/2016   CL 106 06/06/2016   CALCIUM 8.8 (L) 06/06/2016   MG 1.7 06/06/2016   Pain Modulating Vitamins Lab Results  Component Value Date   25OHVITD1 39 06/06/2016   25OHVITD2 2.2 06/06/2016   25OHVITD3 37 06/06/2016   VITAMINB12 593 06/06/2016   Coagulation Parameters Lab Results  Component Value Date   PLT 309 12/22/2015   Cardiovascular Lab Results  Component Value Date   HGB 14.8 12/22/2015   HCT 44.2 12/22/2015   Note: Lab results reviewed.  Recent Diagnostic Imaging  Dg C-arm 1-60 Min-no Report  Result Date: 08/15/2016 CLINICAL  DATA: Sub-acute chronic pain syndrome C-ARM 1-60 MINUTES Fluoroscopy was utilized by the requesting physician.  No radiographic interpretation.    Meds  The patient has a current medication list which includes the following prescription(s): albuterol, albuterol-ipratropium, aspirin ec, benzonatate, bisoprolol-hydrochlorothiazide, butalbital-acetaminophen-caffeine, calcium carbonate-vit d-min, vitamin d, cinnamon, combivent respimat, cyanocobalamin, cyclobenzaprine, diazepam, dicyclomine, estradiol, fluticasone-salmeterol, hydrochlorothiazide, magnesium, metoprolol tartrate, nitroglycerin, oxycodone-acetaminophen, paroxetine, potassium, promethazine, rosuvastatin, and vitamin e.  Current Outpatient Prescriptions on File Prior to Visit  Medication Sig  . albuterol (PROVENTIL) (2.5 MG/3ML) 0.083% nebulizer solution as needed.   Marland Kitchen albuterol-ipratropium (COMBIVENT) 18-103 MCG/ACT inhaler Inhale 2 puffs into the lungs every 6 (six) hours as needed for wheezing or shortness of breath.  Marland Kitchen aspirin EC 81 MG tablet Take 81 mg by mouth daily.  . benzonatate (TESSALON) 200 MG capsule Take by mouth.  . bisoprolol-hydrochlorothiazide (ZIAC) 10-6.25 MG tablet Take 1 tablet by mouth daily.  . butalbital-acetaminophen-caffeine (FIORICET, ESGIC) 50-325-40 MG tablet Take 1 tablet by mouth every 4 (four) hours as needed for headache.  . Cholecalciferol (VITAMIN D) 2000 UNITS tablet Take 1 tablet (2,000 Units total) by mouth daily.  Marland Kitchen Cinnamon ([employer] CINNAMON) 500 MG capsule Take 500 mg by mouth daily.  . COMBIVENT RESPIMAT 20-100 MCG/ACT AERS respimat 3 (three) times daily as needed.   . cyanocobalamin (,VITAMIN B-12,) 1000 MCG/ML injection Every 3 weeks  . diazepam (VALIUM) 5 MG tablet Take 2.5 mg by mouth every 8 (eight) hours as needed for anxiety (takes 1/2 - 1 tablet as needed for anxiety).   Marland Kitchen dicyclomine (BENTYL) 10 MG capsule take 1 capsule by mouth four times a day if needed for ABDOMINAL SPASM  . estradiol  (CLIMARA - DOSED IN MG/24 HR) 0.1 mg/24hr patch Place 0.1 mg onto the skin once a week. On Wednesday  . Fluticasone-Salmeterol (ADVAIR) 100-50 MCG/DOSE AEPB Inhale 1 puff into the lungs 2 (two) times daily.  . hydrochlorothiazide (HYDRODIURIL) 12.5 MG tablet take 1 tablet by mouth once daily if needed for edema  . Magnesium 400 MG TABS Take 1 tablet by mouth 2 (two) times daily.   . nitroGLYCERIN (NITROSTAT) 0.4 MG SL tablet Place 0.4 mg under the tongue every 5 (five) minutes as needed for chest pain.  Marland Kitchen PARoxetine (PAXIL) 30 MG tablet Take 30 mg by mouth daily.  . Potassium 95 MG TABS Take 1 tablet by mouth daily.  . promethazine (PHENERGAN) 25 MG tablet Take 25 mg by mouth every 6 (six) hours as needed for nausea or vomiting.  . rosuvastatin (CRESTOR) 20 MG tablet Take 20 mg by  mouth at bedtime.   . vitamin E 1000 UNIT capsule Take 1,000 Units by mouth daily.   No current facility-administered medications on file prior to visit.     ROS  Constitutional: Denies any fever or chills Gastrointestinal: No reported hemesis, hematochezia, vomiting, or acute GI distress Musculoskeletal: Denies any acute onset joint swelling, redness, loss of ROM, or weakness Neurological: No reported episodes of acute onset apraxia, aphasia, dysarthria, agnosia, amnesia, paralysis, loss of coordination, or loss of consciousness  Allergies  Ms. Kollman is allergic to cymbalta [duloxetine hcl]; duloxetine; flagyl [metronidazole]; lyrica [pregabalin]; vicodin [hydrocodone-acetaminophen]; nsaids; and tape.  PFSH  Medical:  Ms. Fetterly  has a past medical history of Anxiety; Asthma; CAD (coronary artery disease); Chronic fatigue; Chronic thumb pain, bilateral (09/12/2015); COPD (chronic obstructive pulmonary disease) (HCC); Coronary artery disease; Depression; DJD (degenerative joint disease); DJD (degenerative joint disease); Essential hypertension (09/12/2015); Family history of chronic pain (09/26/2015);  Fibromyalgia; Fibromyalgia; Fracture five ribs-closed (09/12/2015); H/O acute myocardial infarction (12/14/2014); H/O non-insulin dependent diabetes mellitus (09/12/2015); History of cardiac arrhythmia (09/12/2015); History of elbow surgery (09/26/2015); History of knee surgery (09/26/2015); History of migraine (09/12/2015); History of seizures (09/12/2015); Hypercholesteremia; MI (myocardial infarction) (HCC); Migraine; Migraine; Osteoarthritis; Right shoulder: Acute Pain due to trauma (04/18/2016); Right shoulder: Chronic Pain (09/12/2015); Seizures (HCC); Ulcerative (chronic) enterocolitis (HCC); and Vertigo. Family: family history includes Breast cancer in her cousin and maternal aunt. Surgical:  has a past surgical history that includes ureter mesh; Lumbar disc surgery; Rotator cuff repair (Right); Knee Arthroplasty (Left); Abdominal hysterectomy; Trigger finger release (Left); and left elbow surgery. Tobacco:  reports that she has been smoking Cigarettes.  She has been smoking about 0.25 packs per day. She has never used smokeless tobacco. Alcohol:  reports that she does not drink alcohol. Drug:  reports that she does not use drugs.  Constitutional Exam  General appearance: Well nourished, well developed, and well hydrated. In no acute distress Vitals:   08/19/16 1058  BP: 124/78  Pulse: 81  Resp: 16  Temp: 97.8 F (36.6 C)  TempSrc: Oral  SpO2: 99%  Weight: 118 lb (53.5 kg)  Height: 4\' 11"  (1.499 m)  BMI Assessment: Estimated body mass index is 23.83 kg/m as calculated from the following:   Height as of this encounter: 4\' 11"  (1.499 m).   Weight as of this encounter: 118 lb (53.5 kg).   BMI interpretation: (18.5-24.9 kg/m2) = Ideal body weight BMI Readings from Last 4 Encounters:  08/19/16 23.83 kg/m  08/15/16 23.83 kg/m  07/23/16 24.43 kg/m  07/12/16 24.43 kg/m   Wt Readings from Last 4 Encounters:  08/19/16 118 lb (53.5 kg)  08/15/16 118 lb (53.5 kg)  07/23/16 123 lb  (55.8 kg)  07/12/16 123 lb (55.8 kg)  Psych/Mental status: Alert and oriented x 3 (person, place, & time) Eyes: PERLA Respiratory: No evidence of acute respiratory distress  Cervical Spine Exam  Inspection: No masses, redness, or swelling Alignment: Symmetrical Functional ROM: ROM appears unrestricted Stability: No instability detected Muscle strength & Tone: Functionally intact Sensory: Unimpaired Palpation: Non-contributory  Upper Extremity (UE) Exam    Side: Right upper extremity  Side: Left upper extremity  Inspection: No masses, redness, swelling, or asymmetry  Inspection: No masses, redness, swelling, or asymmetry  Functional ROM: ROM appears unrestricted          Functional ROM: ROM appears unrestricted          Muscle strength & Tone: Functionally intact  Muscle strength & Tone: Functionally intact  Sensory: Unimpaired  Sensory: Unimpaired  Palpation: Non-contributory  Palpation: Non-contributory   Thoracic Spine Exam  Inspection: No masses, redness, or swelling Alignment: Symmetrical Functional ROM: ROM appears unrestricted Stability: No instability detected Sensory: Unimpaired Muscle strength & Tone: Functionally intact Palpation: Non-contributory  Lumbar Spine Exam  Inspection: No masses, redness, or swelling Alignment: Symmetrical Functional ROM: ROM appears unrestricted Stability: No instability detected Muscle strength & Tone: Functionally intact Sensory: Unimpaired Palpation: Non-contributory Provocative Tests: Lumbar Hyperextension and rotation test: evaluation deferred today       Patrick's Maneuver: evaluation deferred today              Gait & Posture Assessment  Ambulation: Unassisted Gait: Relatively normal for age and body habitus Posture: WNL   Lower Extremity Exam    Side: Right lower extremity  Side: Left lower extremity  Inspection: No masses, redness, swelling, or asymmetry  Inspection: No masses, redness, swelling, or asymmetry   Functional ROM: ROM appears unrestricted          Functional ROM: ROM appears unrestricted          Muscle strength & Tone: Functionally intact  Muscle strength & Tone: Functionally intact  Sensory: Unimpaired  Sensory: Unimpaired  Palpation: Non-contributory  Palpation: Non-contributory    Assessment & Plan  Primary Diagnosis & Pertinent Problem List: The primary encounter diagnosis was Chronic pain. Diagnoses of Fibromyalgia, Chronic pain of shoulders (Location of Primary Source of Pain) (Bilateral) ((R>L), Chronic neck pain (Location of Secondary source of pain) (Bilateral) (L>R), Long term current use of opiate analgesic, and Opiate use (37.5 MME/Day) were also pertinent to this visit.  Visit Diagnosis: 1. Chronic pain   2. Fibromyalgia   3. Chronic pain of shoulders (Location of Primary Source of Pain) (Bilateral) ((R>L)   4. Chronic neck pain (Location of Secondary source of pain) (Bilateral) (L>R)   5. Long term current use of opiate analgesic   6. Opiate use (37.5 MME/Day)     Problems updated and reviewed during this visit: No problems updated.  Problem-specific Plan(s): No problem-specific Assessment & Plan notes found for this encounter.  No new Assessment & Plan notes have been filed under this hospital service since the last note was generated. Service: Pain Management   Plan of Care   Problem List Items Addressed This Visit      High   Chronic neck pain (Location of Secondary source of pain) (Bilateral) (L>R) (Chronic)   Relevant Medications   cyclobenzaprine (FLEXERIL) 10 MG tablet (Start on 09/17/2016)   oxyCODONE-acetaminophen (PERCOCET/ROXICET) 5-325 MG tablet (Start on 08/28/2016)   Chronic pain - Primary (Chronic)   Relevant Medications   cyclobenzaprine (FLEXERIL) 10 MG tablet (Start on 09/17/2016)   oxyCODONE-acetaminophen (PERCOCET/ROXICET) 5-325 MG tablet (Start on 08/28/2016)   Chronic pain of shoulders (Location of Primary Source of Pain)  (Bilateral) ((R>L) (Chronic)   Relevant Orders   Radiofrequency Shoulder Joint   Fibromyalgia (Chronic)   Relevant Medications   cyclobenzaprine (FLEXERIL) 10 MG tablet (Start on 09/17/2016)     Medium   Long term current use of opiate analgesic (Chronic)   Relevant Orders   ToxASSURE Select 13 (MW), Urine   Opiate use (37.5 MME/Day) (Chronic)    Other Visit Diagnoses   None.      Pharmacotherapy (Medications Ordered): Meds ordered this encounter  Medications  . cyclobenzaprine (FLEXERIL) 10 MG tablet    Sig: Take 1 tablet (10 mg total) by mouth 3 (three) times daily.    Dispense:  90 tablet    Refill:  0    Do not place this medication, or any other prescription from our practice, on "Automatic Refill". Patient may have prescription filled one day early if pharmacy is closed on scheduled refill date.  Marland Kitchen DISCONTD: oxyCODONE-acetaminophen (PERCOCET/ROXICET) 5-325 MG tablet    Sig: Take 1 tablet by mouth 5 (five) times daily as needed for severe pain.    Dispense:  150 tablet    Refill:  0    Do not add this medication to the electronic "Automatic Refill" notification system. Patient may have prescription filled one day early if pharmacy is closed on scheduled refill date. Do not fill until: 09/17/16 To last until: 10/17/16  . oxyCODONE-acetaminophen (PERCOCET/ROXICET) 5-325 MG tablet    Sig: Take 1 tablet by mouth 5 (five) times daily as needed for severe pain.    Dispense:  150 tablet    Refill:  0    Do not add this medication to the electronic "Automatic Refill" notification system. Patient may have prescription filled one day early if pharmacy is closed on scheduled refill date. Do not fill until: 08/28/16 To last until: 09/27/16    [facility] & Procedure Ordered: Orders Placed This Encounter  Procedures  . Radiofrequency Shoulder Joint  . ToxASSURE Select 13 (MW), Urine    Imaging Ordered: None  Interventional Therapies: Scheduled:   Bilateral suprascapular  nerve radiofrequency ablation under fluoroscopic guidance and IV sedation.    Considering: Diagnostic bilateral cervical facet block under fluoroscopic guidance and IV sedation. Possible bilateral cervical facet radiofrequency ablation under fluoroscopic guidance and IV sedation. Diagnostic left-sided cervical epidural steroid injection under fluoroscopic guidance, with or without sedation. Diagnostic left intra-articular hip joint injection under fluoroscopic guidance, with or without sedation. Possible left hip joint radiofrequency ablation under fluoroscopic guidance and IV sedation. Diagnostic bilateral lumbar facet block under fluoroscopic guidance and IV sedation. Possible bilateral lumbar facet radiofrequency ablation under fluoroscopic guidance and IV sedation. Diagnostic caudal epidural steroid injection under fluoroscopic guidance, with or without IV sedation. Possible Racz epidural lysis of adhesions under fluoroscopic guidance and IV sedation. Diagnostic bilateral suprascapular nerve block under fluoroscopic guidance, with or without IV sedation. Possible bilateral suprascapular radiofrequency ablation under fluoroscopic guidance and IV sedation. Diagnostic bilateral greater occipital nerve block under fluoroscopic guidance, with or without sedation. Possible bilateral greater occipital nerve radiofrequency ablation under fluoroscopic guidance and IV sedation. Palliative bilateral thumb trigger point injection, without fluoroscopy IV sedation.    PRN Procedures: Diagnostic bilateral cervical facet block under fluoroscopic guidance and IV sedation.  Diagnostic left intra-articular hip joint injection under fluoroscopic guidance, with or without sedation. Diagnostic bilateral lumbar facet block under fluoroscopic guidance and IV sedation. Diagnostic Bilateral suprascapular nerve block under fluoroscopic guidance, with or without IV sedation. Diagnostic bilateral greater  occipital nerve block under fluoroscopic guidance, with or without sedation.    Referral(s) or Consult(s): None at this time.  New Prescriptions   No medications on file    Medications administered during this visit: Ms. Karas had no medications administered during this visit.  Requested PM Follow-up: Return in 4 weeks (on 09/16/2016) for Med-Mgmt.  Future Appointments Date Time Provider Department Center  09/23/2016 11:00 AM Delano Metz, MD [facility] None    Primary Care Physician: Dortha Kern, MD Location: [facility] Note by: Sydnee Levans. Laban Emperor, M.D, DABA, DABAPM, DABPM, DABIPP, FIPP  Pain Score Disclaimer: We use the NRS-11 scale. This is a self-reported, subjective measurement of pain severity with  only modest accuracy. It is used primarily to identify changes within a particular patient. It must be understood that outpatient pain scales are significantly less accurate that those used for research, where they can be applied under ideal controlled circumstances with minimal exposure to variables. In reality, the score is likely to be a combination of pain intensity and pain affect, where pain affect describes the degree of emotional arousal or changes in action readiness caused by the sensory experience of pain. Factors such as social and work situation, setting, emotional state, anxiety levels, expectation, and prior pain experience may influence pain perception and show large inter-individual differences that may also be affected by time variables.  Patient instructions provided at this appointment:: There are no Patient Instructions on file for this visit.

## 2016-08-29 LAB — TOXASSURE SELECT 13 (MW), URINE

## 2016-09-02 ENCOUNTER — Encounter: Payer: Medicare Other | Admitting: Pain Medicine

## 2016-09-16 ENCOUNTER — Telehealth: Payer: Self-pay | Admitting: *Deleted

## 2016-09-16 ENCOUNTER — Telehealth: Payer: Self-pay | Admitting: Pain Medicine

## 2016-09-16 NOTE — Telephone Encounter (Signed)
Dr. Hall BusingPark's office called to let Dr. Laban EmperorNaveira know he prescribed 15 Tylenol with Codeine, patient did not want percocet

## 2016-09-16 NOTE — Telephone Encounter (Signed)
Just an FYI

## 2016-09-16 NOTE — Telephone Encounter (Signed)
Spoke with Michelle HumphreyShanda from Dr Arville CareParks office, [facility] re; post operative pain.  Dr Arville CareParks will call to speak to Dr Laban EmperorNaveira after he finishes procedure.

## 2016-09-17 ENCOUNTER — Encounter: Payer: Self-pay | Admitting: Pain Medicine

## 2016-09-17 ENCOUNTER — Ambulatory Visit (HOSPITAL_BASED_OUTPATIENT_CLINIC_OR_DEPARTMENT_OTHER): Payer: Medicare Other | Admitting: Pain Medicine

## 2016-09-17 ENCOUNTER — Other Ambulatory Visit: Payer: Self-pay | Admitting: Pain Medicine

## 2016-09-17 ENCOUNTER — Ambulatory Visit
Admission: RE | Admit: 2016-09-17 | Discharge: 2016-09-17 | Disposition: A | Discharge status: Home / Self Care | Payer: Medicare Other | Source: Ambulatory Visit | Attending: Pain Medicine | Admitting: Pain Medicine

## 2016-09-17 VITALS — BP 134/87 | HR 73 | Temp 97.6°F | Resp 16 | Ht 59.0 in | Wt 115.0 lb

## 2016-09-17 DIAGNOSIS — M79646 Pain in unspecified finger(s): Secondary | ICD-10-CM

## 2016-09-17 DIAGNOSIS — Z79899 Other long term (current) drug therapy: Secondary | ICD-10-CM | POA: Insufficient documentation

## 2016-09-17 DIAGNOSIS — M549 Dorsalgia, unspecified: Secondary | ICD-10-CM | POA: Diagnosis present

## 2016-09-17 DIAGNOSIS — M79644 Pain in right finger(s): Secondary | ICD-10-CM | POA: Insufficient documentation

## 2016-09-17 DIAGNOSIS — F419 Anxiety disorder, unspecified: Secondary | ICD-10-CM | POA: Insufficient documentation

## 2016-09-17 DIAGNOSIS — G8929 Other chronic pain: Secondary | ICD-10-CM

## 2016-09-17 DIAGNOSIS — M25511 Pain in right shoulder: Secondary | ICD-10-CM

## 2016-09-17 DIAGNOSIS — M79645 Pain in left finger(s): Secondary | ICD-10-CM

## 2016-09-17 DIAGNOSIS — Z885 Allergy status to narcotic agent status: Secondary | ICD-10-CM | POA: Diagnosis not present

## 2016-09-17 DIAGNOSIS — M25512 Pain in left shoulder: Secondary | ICD-10-CM

## 2016-09-17 DIAGNOSIS — Z888 Allergy status to other drugs, medicaments and biological substances status: Secondary | ICD-10-CM | POA: Insufficient documentation

## 2016-09-17 DIAGNOSIS — F1120 Opioid dependence, uncomplicated: Secondary | ICD-10-CM

## 2016-09-17 DIAGNOSIS — Z79891 Long term (current) use of opiate analgesic: Secondary | ICD-10-CM

## 2016-09-17 MED ORDER — ROPIVACAINE HCL 2 MG/ML IJ SOLN: 9.0000 mL | Freq: Once | INTRAMUSCULAR | Status: DC

## 2016-09-17 MED ORDER — MIDAZOLAM HCL 5 MG/5ML IJ SOLN: 1.0000 mg | INTRAMUSCULAR | Status: DC | PRN

## 2016-09-17 MED ORDER — LIDOCAINE HCL (PF) 1 % IJ SOLN: 10.0000 mL | Freq: Once | INTRAMUSCULAR | Status: DC

## 2016-09-17 MED ORDER — ROPIVACAINE HCL 2 MG/ML IJ SOLN
INTRAMUSCULAR | Status: AC
  Administered 2016-09-17: 09:00:00
  Filled 2016-09-17: qty 20

## 2016-09-17 MED ORDER — MIDAZOLAM HCL 5 MG/5ML IJ SOLN
INTRAMUSCULAR | Status: AC
  Administered 2016-09-17: 3 mg via INTRAVENOUS
  Filled 2016-09-17: qty 5

## 2016-09-17 MED ORDER — FENTANYL CITRATE (PF) 100 MCG/2ML IJ SOLN
INTRAMUSCULAR | Status: AC
  Administered 2016-09-17: 100 ug
  Filled 2016-09-17: qty 2

## 2016-09-17 MED ORDER — LACTATED RINGERS IV SOLN: 1000.0000 mL | Freq: Once | INTRAVENOUS | Status: DC

## 2016-09-17 MED ORDER — FENTANYL CITRATE (PF) 100 MCG/2ML IJ SOLN: 25.0000 ug | INTRAMUSCULAR | Status: DC | PRN

## 2016-09-17 NOTE — Progress Notes (Signed)
Safety precautions to be maintained throughout the outpatient stay will include: orient to surroundings, keep bed in low position, maintain call bell within reach at all times, provide assistance with transfer out of bed and ambulation. Patient does not want steroids; patient had tooth extracted last week and still having pain from that.
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
## 2016-09-17 NOTE — Patient Instructions (Signed)
Pain Management Discharge Instructions  General Discharge Instructions :  If you need to reach your doctor call: Monday-Friday 8:00 am - 4:00 pm at [phone] or toll free [phone].  After clinic hours [phone] to have operator reach doctor.  Bring all of your medication bottles to all your appointments in the pain clinic.  To cancel or reschedule your appointment with Pain Management please remember to call 24 hours in advance to avoid a fee.  Refer to the educational materials which you have been given on: General Risks, I had my Procedure. Discharge Instructions, Post Sedation.  Post Procedure Instructions:  The drugs you were given will stay in your system until tomorrow, so for the next 24 hours you should not drive, make any legal decisions or drink any alcoholic beverages.  You may eat anything you prefer, but it is better to start with liquids then soups and crackers, and gradually work up to solid foods.  Please notify your doctor immediately if you have any unusual bleeding, trouble breathing or pain that is not related to your normal pain.  Depending on the type of procedure that was done, some parts of your body may feel week and/or numb.  This usually clears up by tonight or the next day.  Walk with the use of an assistive device or accompanied by an adult for the 24 hours.  You may use ice on the affected area for the first 24 hours.  Put ice in a Ziploc bag and cover with a towel and place against area 15 minutes on 15 minutes off.  You may switch to heat after 24 hours. Trigger Point Injection Trigger points are areas where you have muscle pain. A trigger point injection is a shot given in the trigger point to relieve that pain. A trigger point might feel like a knot in your muscle. It hurts to press on a trigger point. Sometimes the pain spreads out (radiates) to other parts of the body. For example, pressing on a trigger point in your shoulder might cause pain  in your arm or neck. You might have one trigger point. Or, you might have more than one. People often have trigger points in their upper back and lower back. They also occur often in the neck and shoulders. Pain from a trigger point lasts for a long time. It can make it hard to keep moving. You might not be able to do the exercise or physical therapy that could help you deal with the pain. A trigger point injection may help. It does not work for everyone. But, it may relieve your pain for a few days or a few months. A trigger point injection does not cure long-lasting (chronic) pain. LET YOUR CAREGIVER KNOW ABOUT:  Any allergies (especially to latex, lidocaine, or steroids).  Blood-thinning medicines that you take. These drugs can lead to bleeding or bruising after an injection. They include:  Aspirin.  Ibuprofen.  Clopidogrel.  Warfarin.  Other medicines you take. This includes all vitamins, herbs, eyedrops, over-the-counter medicines, and creams.  Use of steroids.  Recent infections.  Past problems with numbing medicines.  Bleeding problems.  Surgeries you have had.  Other health problems. RISKS AND COMPLICATIONS A trigger point injection is a safe treatment. However, problems may develop, such as:  Minor side effects usually go away in 1 to 2 days. These may include:  Soreness.  Bruising.  Stiffness.  More serious problems are rare. But, they may include:  Bleeding under the skin (hematoma).    Skin infection.  Breaking off of the needle under your skin.  Lung puncture.  The trigger point injection may not work for you. BEFORE THE PROCEDURE You may need to stop taking any medicine that thins your blood. This is to prevent bleeding and bruising. Usually these medicines are stopped several days before the injection. No other preparation is needed. PROCEDURE  A trigger point injection can be given in your caregiver's office or in a clinic. Each injection takes 2  minutes or less.  Your caregiver will feel for trigger points. The caregiver may use a marker to circle the area for the injection.  The skin over the trigger point will be washed with a germ-killing (antiseptic) solution.  The caregiver pinches the spot for the injection.  Then, a very thin needle is used for the shot. You may feel pain or a twitching feeling when the needle enters the trigger point.  A numbing solution may be injected into the trigger point. Sometimes a drug to keep down swelling, redness, and warmth (inflammation) is also injected.  Your caregiver moves the needle around the trigger zone until the tightness and twitching goes away.  After the injection, your caregiver may put gentle pressure over the injection site.  Then it is covered with a bandage. AFTER THE PROCEDURE  You can go right home after the injection.  The bandage can be taken off after a few hours.  You may feel sore and stiff for 1 to 2 days.  Go back to your regular activities slowly. Your caregiver may ask you to stretch your muscles. Do not do anything that takes extra energy for a few days.  Follow your caregiver's instructions to manage and treat other pain.   This information is not intended to replace advice given to you by your health care provider. Make sure you discuss any questions you have with your health care provider.   Document Released: 11/07/2011 Document Revised: 03/15/2013 Document Reviewed: 11/07/2011 [employer] Interactive Patient Education 2016 [employer]  

## 2016-09-17 NOTE — Progress Notes (Signed)
Patient's Name: Michelle Newton  MRN: [id number]  Referring Provider: Delano Metz, MD  DOB: 10-15-1961  PCP: Dortha Kern, MD  DOS: 09/17/2016  Note by: Sydnee Levans. Laban Emperor, MD  Service setting: Ambulatory outpatient  Location: [facility] (AMB) Pain Management Facility  Visit type: Procedure  Specialty: Interventional Pain Management  Patient type: Established   Primary Reason for Visit: Interventional Pain Management Treatment. CC: Back Pain (upper back between shoulder blades)  Procedure:  Anesthesia, Analgesia, Anxiolysis:  Type: Diagnostic Suprascapular nerve Block, without steroids Region: Posterior Shoulder & Scapular Areas Level: Superior to the scapular spine, in the lateral aspect of the supraspinatus fossa (Suprescapular notch). Laterality: Bilateral  Type: Local Anesthesia with Moderate (Conscious) Sedation Local Anesthetic: Lidocaine 1% Route: Intravenous (IV) IV Access: Secured Sedation: Meaningful verbal contact was maintained at all times during the procedure  Indication(s): Analgesia and Anxiety  Indications: 1. Chronic pain of shoulders (Location of Primary Source of Pain) (Bilateral) ((R>L)   2. Right shoulder: Chronic Pain   3. Chronic thumb pain (Bilateral)    Pain Score: Pre-procedure: 7 /10 Post-procedure: 0-No pain/10  Pre-Procedure Assessment:  Michelle Newton is a 55 y.o. (year old), female patient, seen today for interventional treatment. She  has a past surgical history that includes ureter mesh; Lumbar disc surgery; Rotator cuff repair (Right); Knee Arthroplasty (Left); Abdominal hysterectomy; Trigger finger release (Left); and left elbow surgery.. Her primarily concern today is the Back Pain (upper back between shoulder blades) The primary encounter diagnosis was Chronic pain of shoulders (Location of Primary Source of Pain) (Bilateral) ((R>L). Diagnoses of Right shoulder: Chronic Pain and Chronic thumb pain (Bilateral) were also pertinent to this visit.    Pain Type: Chronic pain Pain Location: Back Pain Orientation: Upper Pain Descriptors / Indicators: Aching, Constant, Stabbing, Throbbing Pain Frequency: Constant  Date of Last Visit: 08/19/16 Service Provided on Last Visit: Med Refill  Coagulation Parameters Lab Results  Component Value Date   PLT 309 12/22/2015   Verification of the correct person, correct site (including marking of site), and correct procedure were performed and confirmed by the patient.  Consent: Before the procedure and under the influence of no sedative(s), amnesic(s), or anxiolytics, the patient was informed of the treatment options, risks and possible complications. To fulfill our ethical and legal obligations, as recommended by the American Medical Association's Code of Ethics, I have informed the patient of my clinical impression; the nature and purpose of the treatment or procedure; the risks, benefits, and possible complications of the intervention; the alternatives, including doing nothing; the risk(s) and benefit(s) of the alternative treatment(s) or procedure(s); and the risk(s) and benefit(s) of doing nothing. The patient was provided information about the general risks and possible complications associated with the procedure. These may include, but are not limited to: failure to achieve desired goals, infection, bleeding, organ or nerve damage, allergic reactions, paralysis, and death. In addition, the patient was informed of those risks and complications associated to the procedure, such as failure to decrease pain; infection; bleeding; organ or nerve damage with subsequent damage to sensory, motor, and/or autonomic systems, resulting in permanent pain, numbness, and/or weakness of one or several areas of the body; allergic reactions; (i.e.: anaphylactic reaction); and/or death. Furthermore, the patient was informed of those risks and complications associated with the medications. These include, but are not  limited to: allergic reactions (i.e.: anaphylactic or anaphylactoid reaction(s)); adrenal axis suppression; blood sugar elevation that in diabetics may result in ketoacidosis or comma; water retention that in  patients with history of congestive heart failure may result in shortness of breath, pulmonary edema, and decompensation with resultant heart failure; weight gain; swelling or edema; medication-induced neural toxicity; particulate matter embolism and blood vessel occlusion with resultant organ, and/or nervous system infarction; and/or aseptic necrosis of one or more joints. Finally, the patient was informed that Medicine is not an exact science; therefore, there is also the possibility of unforeseen or unpredictable risks and/or possible complications that may result in a catastrophic outcome. The patient indicated having understood very clearly. We have given the patient no guarantees and we have made no promises. Enough time was given to the patient to ask questions, all of which were answered to the patient's satisfaction. Michelle Newton has indicated that she wanted to continue with the procedure.  Consent Attestation: I, the ordering provider, attest that I have discussed with the patient the benefits, risks, side-effects, alternatives, likelihood of achieving goals, and potential problems during recovery for the procedure that I have provided informed consent.  Pre-Procedure Preparation:  Safety Precautions: Allergies reviewed. The patient was asked about blood thinners, or active infections, both of which were denied. The patient was asked to confirm the procedure and laterality, before marking the site, and again before commencing the procedure. Appropriate site, procedure, and patient were confirmed by following the Joint Commission's Universal Protocol (UP.01.01.01), in the form of a "Time Out". The patient was asked to participate by confirming the accuracy of the "Time Out" information. Patient  was assessed for positional comfort and pressure points before starting the procedure. Allergies: She is allergic to cymbalta [duloxetine hcl]; duloxetine; flagyl [metronidazole]; lyrica [pregabalin]; vicodin [hydrocodone-acetaminophen]; nsaids; and tape.. Allergy Precautions: None required Infection Control Precautions: Sterile technique used. Standard Universal Precautions were taken as recommended by the Department of [facility] for Disease Control and Prevention (CDC). Standard pre-surgical skin prep was conducted. Respiratory hygiene and cough etiquette was practiced. Hand hygiene observed. Safe injection practices and needle disposal techniques followed. SDV (single dose vial) medications used. Medications properly checked for expiration dates and contaminants. Personal protective equipment (PPE) used as per protocol. Monitoring:  As per clinic protocol. Vitals:   09/17/16 0922 09/17/16 0928 09/17/16 0938 09/17/16 0948  BP: (!) 135/98 (!) 146/81 (!) 144/99 134/87  Pulse:  78 75 73  Resp: 13 18 20 16   Temp:  (!) 96.9 F (36.1 C) 97.6 F (36.4 C)   TempSrc:      SpO2: 95% 97% 95% 93%  Weight:      Height:      Calculated BMI: Body mass index is 23.23 kg/m. Time-out: "Time-out" completed before starting procedure, as per protocol.  Description of Procedure Process:   Time-out: "Time-out" completed before starting procedure, as per protocol. Position: Prone", Target Area: Suprascapular notch. Approach: Posterior approach. Area Prepped: Entire shoulder Area Prepping solution: ChloraPrep (2% chlorhexidine gluconate and 70% isopropyl alcohol) Safety Precautions: Aspiration looking for blood return was conducted prior to all injections. At no point did we inject any substances, as a needle was being advanced. No attempts were made at seeking any paresthesias. Safe injection practices and needle disposal techniques used. Medications properly checked for expiration dates. SDV  (single dose vial) medications used. Description of the Procedure: Protocol guidelines were followed. The patient was placed in position over the procedure table. The target area was identified and the area prepped in the usual manner. Skin & deeper tissues infiltrated with local anesthetic. Appropriate amount of time allowed to pass for local anesthetics to  take effect. The procedure needles were then advanced to the target area. Proper needle placement secured. Negative aspiration confirmed. Solution injected in intermittent fashion, asking for systemic symptoms every 0.5cc of injectate. The needles were then removed and the area cleansed, making sure to leave some of the prepping solution back to take advantage of its long term bactericidal properties. EBL: None Materials & Medications Used:  Needle(s) Used: 22g - 3.5" Spinal Needle(s) Solution Injected: 0.2% PF-Ropivacaine (10 mL). No steroids. Medications Administered today: We administered ropivacaine (PF) 2 mg/ml (0.2%), fentaNYL, and midazolam.Please see chart orders for dosing details. Imaging Guidance (Non-Spinal):  Type of Imaging Technique: Fluoroscopy Guidance (Non-Spinal) Indication(s): Assistance in needle guidance and placement for procedures requiring needle placement in or near specific anatomical locations not easily accessible without such assistance. Exposure Time: Please see nurses notes. Contrast: None used. Fluoroscopic Guidance: I was personally present during the use of fluoroscopy. "Tunnel Vision Technique" used to obtain the best possible view of the target area. Parallax error corrected before commencing the procedure. "Direction-depth-direction" technique used to introduce the needle under continuous pulsed fluoroscopy. Once target was reached, antero-posterior, oblique, and lateral fluoroscopic projection used confirm needle placement in all planes. Images permanently stored in EMR. Interpretation: No contrast injected. I  personally interpreted the imaging intraoperatively. Adequate needle placement confirmed in multiple planes. Permanent images saved into the patient's record.  Antibiotic Prophylaxis:  Indication(s): No indications identified. Type:  Antibiotics Given (last 72 hours)    None      Post-operative Assessment:  Complications: No immediate post-treatment complications observed by team, or reported by patient. Disposition: The patient tolerated the entire procedure well. A repeat set of vitals were taken after the procedure and the patient was kept under observation following institutional policy, for this type of procedure. Post-procedural neurological assessment was performed, showing return to baseline, prior to discharge. The patient was provided with post-procedure discharge instructions, including a section on how to identify potential problems. Should any problems arise concerning this procedure, the patient was given instructions to immediately contact us, at any time, without hesitation. In any case, we plan to contact the patient by telephone for a follow-up status report regarding this interventional procedure. Comments:  No additional relevant information.  Plan of Care  Discharge to: Discharge home  Medications ordered for procedure: Meds ordered this encounter  Medications  . fentaNYL (SUBLIMAZE) injection 25-50 mcg    Make sure Narcan is available in the pyxis when using this medication. In the event of respiratory depression (RR< 8/min): Titrate NARCAN (naloxone) in increments of 0.1 to 0.2 mg IV at 2-3 minute intervals, until desired degree of reversal.  . lactated ringers infusion 1,000 mL  . midazolam (VERSED) 5 MG/5ML injection 1-2 mg    Make sure Flumazenil is available in the pyxis when using this medication. If oversedation occurs, administer 0.2 mg IV over 15 sec. If after 45 sec no response, administer 0.2 mg again over 1 min; may repeat at 1 min intervals; not to exceed 4  doses (1 mg)  . ropivacaine (PF) 2 mg/ml (0.2%) (NAROPIN) epidural 9 mL  . lidocaine (PF) (XYLOCAINE) 1 % injection 10 mL  . ropivacaine (PF) 2 mg/ml (0.2%) (NAROPIN) 2 MG/ML epidural    Jarold Motto, Delores: cabinet override  . fentaNYL (SUBLIMAZE) 100 MCG/2ML injection    Jarold Motto, Delores: cabinet override  . midazolam (VERSED) 5 MG/5ML injection    Jarold Motto, Delores: cabinet override   Medications administered: (For more details, see medical record) We administered  ropivacaine (PF) 2 mg/ml (0.2%), fentaNYL, and midazolam.  Imaging Ordered: Results for orders placed in visit on 08/15/16  DG C-Arm 1-60 Min-No Report   Narrative CLINICAL DATA: Sub-acute chronic pain syndrome   C-ARM 1-60 MINUTES  Fluoroscopy was utilized by the requesting physician.  No radiographic  interpretation.     New Prescriptions   No medications on file   Physician-requested Follow-up:  Return in about 2 weeks (around 10/01/2016) for Post-Procedure evaluation.  Future Appointments Date Time Provider Department Center  09/23/2016 11:00 AM Delano Metz, MD [facility] None   Primary Care Physician: Dortha Kern, MD Location: [facility] Note by: Sydnee Levans. Laban Emperor, M.D, DABA, DABAPM, DABPM, DABIPP, FIPP  Disclaimer:  Medicine is not an exact science. The only guarantee in medicine is that nothing is guaranteed. It is important to note that the decision to proceed with this intervention was based on the information collected from the patient. The Data and conclusions were drawn from the patient's questionnaire, the interview, and the physical examination. Because the information was provided in large part by the patient, it cannot be guaranteed that it has not been purposely or unconsciously manipulated. Every effort has been made to obtain as much relevant data as possible for this evaluation. It is important to note that the conclusions that lead to this procedure are  derived in large part from the available data. Always take into account that the treatment will also be dependent on availability of resources and existing treatment guidelines, considered by other Pain Management Practitioners as being common knowledge and practice, at the time of the intervention. For Medico-Legal purposes, it is also important to point out that variation in procedural techniques and pharmacological choices are the acceptable norm. The indications, contraindications, technique, and results of the above procedure should only be interpreted and judged by a Board-Certified Interventional Pain Specialist with extensive familiarity and expertise in the same exact procedure and technique. Attempts at providing opinions without similar or greater experience and expertise than that of the treating physician will be considered as inappropriate and unethical, and shall result in a formal complaint to the state medical board and applicable specialty societies.  Instructions provided at this appointment: Patient Instructions  Pain Management Discharge Instructions  General Discharge Instructions :  If you need to reach your doctor call: Monday-Friday 8:00 am - 4:00 pm at [phone] or toll free [phone].  After clinic hours [phone] to have operator reach doctor.  Bring all of your medication bottles to all your appointments in the pain clinic.  To cancel or reschedule your appointment with Pain Management please remember to call 24 hours in advance to avoid a fee.  Refer to the educational materials which you have been given on: General Risks, I had my Procedure. Discharge Instructions, Post Sedation.  Post Procedure Instructions:  The drugs you were given will stay in your system until tomorrow, so for the next 24 hours you should not drive, make any legal decisions or drink any alcoholic beverages.  You may eat anything you prefer, but it is better to start with liquids then  soups and crackers, and gradually work up to solid foods.  Please notify your doctor immediately if you have any unusual bleeding, trouble breathing or pain that is not related to your normal pain.  Depending on the type of procedure that was done, some parts of your body may feel week and/or numb.  This usually clears up by tonight or the next  day.  Walk with the use of an assistive device or accompanied by an adult for the 24 hours.  You may use ice on the affected area for the first 24 hours.  Put ice in a Ziploc bag and cover with a towel and place against area 15 minutes on 15 minutes off.  You may switch to heat after 24 hours.Trigger Point Injection Trigger points are areas where you have muscle pain. A trigger point injection is a shot given in the trigger point to relieve that pain. A trigger point might feel like a knot in your muscle. It hurts to press on a trigger point. Sometimes the pain spreads out (radiates) to other parts of the body. For example, pressing on a trigger point in your shoulder might cause pain in your arm or neck. You might have one trigger point. Or, you might have more than one. People often have trigger points in their upper back and lower back. They also occur often in the neck and shoulders. Pain from a trigger point lasts for a long time. It can make it hard to keep moving. You might not be able to do the exercise or physical therapy that could help you deal with the pain. A trigger point injection may help. It does not work for everyone. But, it may relieve your pain for a few days or a few months. A trigger point injection does not cure long-lasting (chronic) pain. LET YOUR CAREGIVER KNOW ABOUT:  Any allergies (especially to latex, lidocaine, or steroids).  Blood-thinning medicines that you take. These drugs can lead to bleeding or bruising after an injection. They include:  Aspirin.  Ibuprofen.  Clopidogrel.  Warfarin.  Other medicines you take. This  includes all vitamins, herbs, eyedrops, over-the-counter medicines, and creams.  Use of steroids.  Recent infections.  Past problems with numbing medicines.  Bleeding problems.  Surgeries you have had.  Other health problems. RISKS AND COMPLICATIONS A trigger point injection is a safe treatment. However, problems may develop, such as:  Minor side effects usually go away in 1 to 2 days. These may include:  Soreness.  Bruising.  Stiffness.  More serious problems are rare. But, they may include:  Bleeding under the skin (hematoma).  Skin infection.  Breaking off of the needle under your skin.  Lung puncture.  The trigger point injection may not work for you. BEFORE THE PROCEDURE You may need to stop taking any medicine that thins your blood. This is to prevent bleeding and bruising. Usually these medicines are stopped several days before the injection. No other preparation is needed. PROCEDURE  A trigger point injection can be given in your caregiver's office or in a clinic. Each injection takes 2 minutes or less.  Your caregiver will feel for trigger points. The caregiver may use a marker to circle the area for the injection.  The skin over the trigger point will be washed with a germ-killing (antiseptic) solution.  The caregiver pinches the spot for the injection.  Then, a very thin needle is used for the shot. You may feel pain or a twitching feeling when the needle enters the trigger point.  A numbing solution may be injected into the trigger point. Sometimes a drug to keep down swelling, redness, and warmth (inflammation) is also injected.  Your caregiver moves the needle around the trigger zone until the tightness and twitching goes away.  After the injection, your caregiver may put gentle pressure over the injection site.  Then it  is covered with a bandage. AFTER THE PROCEDURE  You can go right home after the injection.  The bandage can be taken off  after a few hours.  You may feel sore and stiff for 1 to 2 days.  Go back to your regular activities slowly. Your caregiver may ask you to stretch your muscles. Do not do anything that takes extra energy for a few days.  Follow your caregiver's instructions to manage and treat other pain.   This information is not intended to replace advice given to you by your health care provider. Make sure you discuss any questions you have with your health care provider.   Document Released: 11/07/2011 Document Revised: 03/15/2013 Document Reviewed: 11/07/2011 Elsevier Interactive Patient Education [employer]

## 2016-09-18 ENCOUNTER — Telehealth: Payer: Self-pay

## 2016-09-18 NOTE — Telephone Encounter (Signed)
Post procedure call, patient states she is a little sore today. Instructed to use heat today and call with any concerns.

## 2016-09-23 ENCOUNTER — Ambulatory Visit: Payer: Medicare Other | Attending: Pain Medicine | Admitting: Pain Medicine

## 2016-09-23 ENCOUNTER — Encounter: Payer: Self-pay | Admitting: Pain Medicine

## 2016-09-23 VITALS — BP 150/87 | HR 92 | Temp 98.4°F | Resp 16 | Ht 59.5 in | Wt 115.0 lb

## 2016-09-23 DIAGNOSIS — M797 Fibromyalgia: Secondary | ICD-10-CM | POA: Insufficient documentation

## 2016-09-23 DIAGNOSIS — Z8249 Family history of ischemic heart disease and other diseases of the circulatory system: Secondary | ICD-10-CM | POA: Insufficient documentation

## 2016-09-23 DIAGNOSIS — I10 Essential (primary) hypertension: Secondary | ICD-10-CM | POA: Insufficient documentation

## 2016-09-23 DIAGNOSIS — M25512 Pain in left shoulder: Secondary | ICD-10-CM | POA: Insufficient documentation

## 2016-09-23 DIAGNOSIS — I252 Old myocardial infarction: Secondary | ICD-10-CM | POA: Insufficient documentation

## 2016-09-23 DIAGNOSIS — F329 Major depressive disorder, single episode, unspecified: Secondary | ICD-10-CM | POA: Insufficient documentation

## 2016-09-23 DIAGNOSIS — G8929 Other chronic pain: Secondary | ICD-10-CM | POA: Insufficient documentation

## 2016-09-23 DIAGNOSIS — J449 Chronic obstructive pulmonary disease, unspecified: Secondary | ICD-10-CM | POA: Insufficient documentation

## 2016-09-23 DIAGNOSIS — E7800 Pure hypercholesterolemia, unspecified: Secondary | ICD-10-CM | POA: Insufficient documentation

## 2016-09-23 DIAGNOSIS — M542 Cervicalgia: Secondary | ICD-10-CM | POA: Diagnosis not present

## 2016-09-23 DIAGNOSIS — I2510 Atherosclerotic heart disease of native coronary artery without angina pectoris: Secondary | ICD-10-CM | POA: Insufficient documentation

## 2016-09-23 DIAGNOSIS — G894 Chronic pain syndrome: Secondary | ICD-10-CM | POA: Insufficient documentation

## 2016-09-23 DIAGNOSIS — M545 Low back pain: Secondary | ICD-10-CM | POA: Diagnosis not present

## 2016-09-23 DIAGNOSIS — M25511 Pain in right shoulder: Secondary | ICD-10-CM | POA: Diagnosis present

## 2016-09-23 DIAGNOSIS — F17210 Nicotine dependence, cigarettes, uncomplicated: Secondary | ICD-10-CM | POA: Insufficient documentation

## 2016-09-23 MED ORDER — OXYCODONE-ACETAMINOPHEN 5-325 MG PO TABS: 1.0000 | ORAL_TABLET | Freq: Every day | ORAL | 0 refills | Status: DC | PRN

## 2016-09-23 NOTE — Patient Instructions (Signed)
GENERAL RISKS AND COMPLICATIONS  What are the risk, side effects and possible complications? Generally speaking, most procedures are safe.  However, with any procedure there are risks, side effects, and the possibility of complications.  The risks and complications are dependent upon the sites that are lesioned, or the type of nerve block to be performed.  The closer the procedure is to the spine, the more serious the risks are.  Great care is taken when placing the radio frequency needles, block needles or lesioning probes, but sometimes complications can occur. 1. Infection: Any time there is an injection through the skin, there is a risk of infection.  This is why sterile conditions are used for these blocks.  There are four possible types of infection. 1. Localized skin infection. 2. Central Nervous System Infection-This can be in the form of Meningitis, which can be deadly. 3. Epidural Infections-This can be in the form of an epidural abscess, which can cause pressure inside of the spine, causing compression of the spinal cord with subsequent paralysis. This would require an emergency surgery to decompress, and there are no guarantees that the patient would recover from the paralysis. 4. Discitis-This is an infection of the intervertebral discs.  It occurs in about 1% of discography procedures.  It is difficult to treat and it may lead to surgery.        2. Pain: the needles have to go through skin and soft tissues, will cause soreness.       3. Damage to internal structures:  The nerves to be lesioned may be near blood vessels or    other nerves which can be potentially damaged.       4. Bleeding: Bleeding is more common if the patient is taking blood thinners such as  aspirin, Coumadin, Ticiid, Plavix, etc., or if he/she have some genetic predisposition  such as hemophilia. Bleeding into the spinal canal can cause compression of the spinal  cord with subsequent paralysis.  This would require an  emergency surgery to  decompress and there are no guarantees that the patient would recover from the  paralysis.       5. Pneumothorax:  Puncturing of a lung is a possibility, every time a needle is introduced in  the area of the chest or upper back.  Pneumothorax refers to free air around the  collapsed lung(s), inside of the thoracic cavity (chest cavity).  Another two possible  complications related to a similar event would include: Hemothorax and Chylothorax.   These are variations of the Pneumothorax, where instead of air around the collapsed  lung(s), you may have blood or chyle, respectively.       6. Spinal headaches: They may occur with any procedures in the area of the spine.       7. Persistent CSF (Cerebro-Spinal Fluid) leakage: This is a rare problem, but may occur  with prolonged intrathecal or epidural catheters either due to the formation of a fistulous  track or a dural tear.       8. Nerve damage: By working so close to the spinal cord, there is always a possibility of  nerve damage, which could be as serious as a permanent spinal cord injury with  paralysis.       9. Death:  Although rare, severe deadly allergic reactions known as "Anaphylactic  reaction" can occur to any of the medications used.      10. Worsening of the symptoms:  We can always make thing worse.    What are the chances of something like this happening? Chances of any of this occuring are extremely low.  By statistics, you have more of a chance of getting killed in a motor vehicle accident: while driving to the hospital than any of the above occurring .  Nevertheless, you should be aware that they are possibilities.  In general, it is similar to taking a shower.  Everybody knows that you can slip, hit your head and get killed.  Does that mean that you should not shower again?  Nevertheless always keep in mind that statistics do not mean anything if you happen to be on the wrong side of them.  Even if a procedure has a 1  (one) in a 1,000,000 (million) chance of going wrong, it you happen to be that one..Also, keep in mind that by statistics, you have more of a chance of having something go wrong when taking medications.  Who should not have this procedure? If you are on a blood thinning medication (e.g. Coumadin, Plavix, see list of "Blood Thinners"), or if you have an active infection going on, you should not have the procedure.  If you are taking any blood thinners, please inform your physician.  How should I prepare for this procedure?  Do not eat or drink anything at least six hours prior to the procedure.  Bring a driver with you .  It cannot be a taxi.  Come accompanied by an adult that can drive you back, and that is strong enough to help you if your legs get weak or numb from the local anesthetic.  Take all of your medicines the morning of the procedure with just enough water to swallow them.  If you have diabetes, make sure that you are scheduled to have your procedure done first thing in the morning, whenever possible.  If you have diabetes, take only half of your insulin dose and notify our nurse that you have done so as soon as you arrive at the clinic.  If you are diabetic, but only take blood sugar pills (oral hypoglycemic), then do not take them on the morning of your procedure.  You may take them after you have had the procedure.  Do not take aspirin or any aspirin-containing medications, at least eleven (11) days prior to the procedure.  They may prolong bleeding.  Wear loose fitting clothing that may be easy to take off and that you would not mind if it got stained with Betadine or blood.  Do not wear any jewelry or perfume  Remove any nail coloring.  It will interfere with some of our monitoring equipment.  NOTE: Remember that this is not meant to be interpreted as a complete list of all possible complications.  Unforeseen problems may occur.  BLOOD THINNERS The following drugs  contain aspirin or other products, which can cause increased bleeding during surgery and should not be taken for 2 weeks prior to and 1 week after surgery.  If you should need take something for relief of minor pain, you may take acetaminophen which is found in Tylenol,m Datril, Anacin-3 and Panadol. It is not blood thinner. The products listed below are.  Do not take any of the products listed below in addition to any listed on your instruction sheet.  A.P.C or A.P.C with Codeine Codeine Phosphate Capsules #3 Ibuprofen Ridaura  ABC compound Congesprin Imuran rimadil  Advil Cope Indocin Robaxisal  Alka-Seltzer Effervescent Pain Reliever and Antacid Coricidin or Coricidin-D  Indomethacin Rufen    Alka-Seltzer plus Cold Medicine Cosprin Ketoprofen S-A-C Tablets  Anacin Analgesic Tablets or Capsules Coumadin Korlgesic Salflex  Anacin Extra Strength Analgesic tablets or capsules CP-2 Tablets Lanoril Salicylate  Anaprox Cuprimine Capsules Levenox Salocol  Anexsia-D Dalteparin Magan Salsalate  Anodynos Darvon compound Magnesium Salicylate Sine-off  Ansaid Dasin Capsules Magsal Sodium Salicylate  Anturane Depen Capsules Marnal Soma  APF Arthritis pain formula Dewitt's Pills Measurin Stanback  Argesic Dia-Gesic Meclofenamic Sulfinpyrazone  Arthritis Bayer Timed Release Aspirin Diclofenac Meclomen Sulindac  Arthritis pain formula Anacin Dicumarol Medipren Supac  Analgesic (Safety coated) Arthralgen Diffunasal Mefanamic Suprofen  Arthritis Strength Bufferin Dihydrocodeine Mepro Compound Suprol  Arthropan liquid Dopirydamole Methcarbomol with Aspirin Synalgos  ASA tablets/Enseals Disalcid Micrainin Tagament  Ascriptin Doan's Midol Talwin  Ascriptin A/D Dolene Mobidin Tanderil  Ascriptin Extra Strength Dolobid Moblgesic Ticlid  Ascriptin with Codeine Doloprin or Doloprin with Codeine Momentum Tolectin  Asperbuf Duoprin Mono-gesic Trendar  Aspergum Duradyne Motrin or Motrin IB Triminicin  Aspirin  plain, buffered or enteric coated Durasal Myochrisine Trigesic  Aspirin Suppositories Easprin Nalfon Trillsate  Aspirin with Codeine Ecotrin Regular or Extra Strength Naprosyn Uracel  Atromid-S Efficin Naproxen Ursinus  Auranofin Capsules Elmiron Neocylate Vanquish  Axotal Emagrin Norgesic Verin  Azathioprine Empirin or Empirin with Codeine Normiflo Vitamin E  Azolid Emprazil Nuprin Voltaren  Bayer Aspirin plain, buffered or children's or timed BC Tablets or powders Encaprin Orgaran Warfarin Sodium  Buff-a-Comp Enoxaparin Orudis Zorpin  Buff-a-Comp with Codeine Equegesic Os-Cal-Gesic   Buffaprin Excedrin plain, buffered or Extra Strength Oxalid   Bufferin Arthritis Strength Feldene Oxphenbutazone   Bufferin plain or Extra Strength Feldene Capsules Oxycodone with Aspirin   Bufferin with Codeine Fenoprofen Fenoprofen Pabalate or Pabalate-SF   Buffets II Flogesic Panagesic   Buffinol plain or Extra Strength Florinal or Florinal with Codeine Panwarfarin   Buf-Tabs Flurbiprofen Penicillamine   Butalbital Compound Four-way cold tablets Penicillin   Butazolidin Fragmin Pepto-Bismol   Carbenicillin Geminisyn Percodan   Carna Arthritis Reliever Geopen Persantine   Carprofen Gold's salt Persistin   Chloramphenicol Goody's Phenylbutazone   Chloromycetin Haltrain Piroxlcam   Clmetidine heparin Plaquenil   Cllnoril Hyco-pap Ponstel   Clofibrate Hydroxy chloroquine Propoxyphen         Before stopping any of these medications, be sure to consult the physician who ordered them.  Some, such as Coumadin (Warfarin) are ordered to prevent or treat serious conditions such as "deep thrombosis", "pumonary embolisms", and other heart problems.  The amount of time that you may need off of the medication may also vary with the medication and the reason for which you were taking it.  If you are taking any of these medications, please make sure you notify your pain physician before you undergo any  procedures.         Radiofrequency Lesioning Radiofrequency lesioning is a procedure that is performed to relieve pain. The procedure is often used for back, neck, or arm pain. Radiofrequency lesioning involves the use of a machine that creates radio waves to make heat. During the procedure, the heat is applied to the nerve that carries the pain signal. The heat damages the nerve and interferes with the pain signal. Pain relief usually lasts for 6 months to 1 year. LET YOUR HEALTH CARE PROVIDER KNOW ABOUT:  Any allergies you have.  All medicines you are taking, including vitamins, herbs, eye drops, creams, and over-the-counter medicines.  Previous problems you or members of your family have had with the use of anesthetics.  Any blood disorders you have.    Previous surgeries you have had.  Any medical conditions you have.  Whether you are pregnant or may be pregnant. RISKS AND COMPLICATIONS Generally, this is a safe procedure. However, problems may occur, including:  Pain or soreness at the injection site.  Infection at the injection site.  Damage to nerves or blood vessels. BEFORE THE PROCEDURE  Ask your health care provider about:  Changing or stopping your regular medicines. This is especially important if you are taking diabetes medicines or blood thinners.  Taking medicines such as aspirin and ibuprofen. These medicines can thin your blood. Do not take these medicines before your procedure if your health care provider instructs you not to.  Follow instructions from your health care provider about eating or drinking restrictions.  Plan to have someone take you home after the procedure.  If you go home right after the procedure, plan to have someone with you for 24 hours. PROCEDURE  You will be given one or more of the following:  A medicine to help you relax (sedative).  A medicine to numb the area (local anesthetic).  You will be awake during the procedure.  You will need to be able to talk with the health care provider during the procedure.  With the help of a type of X-ray (fluoroscopy), the health care provider will insert a radiofrequency needle into the area to be treated.  Next, a wire that carries the radio waves (electrode) will be put through the radiofrequency needle. An electrical pulse will be sent through the electrode to verify the correct nerve. You will feel a tingling sensation, and you may have muscle twitching.  Then, the tissue that is around the needle tip will be heated by an electric current that is passed using the radiofrequency machine. This will numb the nerves.  A bandage (dressing) will be put on the insertion area after the procedure is done. The procedure may vary among health care providers and hospitals. AFTER THE PROCEDURE  Your blood pressure, heart rate, breathing rate, and blood oxygen level will be monitored often until the medicines you were given have worn off.  Return to your normal activities as directed by your health care provider.   This information is not intended to replace advice given to you by your health care provider. Make sure you discuss any questions you have with your health care provider.   Document Released: 07/17/2011 Document Revised: 08/09/2015 Document Reviewed: 12/26/2014 [employer] Interactive Patient Education 2016 [employer]  

## 2016-09-23 NOTE — Progress Notes (Signed)
Patient's Name: Michelle Newton  MRN: [id number]  Referring Provider: Lynnell Jude, MD  DOB: 1961/11/25  PCP: Lynnell Jude, MD  DOS: 09/23/2016  Note by: Kathlen Brunswick. Dossie Arbour, MD  Service setting: Ambulatory outpatient  Specialty: Interventional Pain Management  Location: [facility] (AMB) Pain Management Facility    Patient type: Established   Primary Reason(s) for Visit: Encounter for prescription drug management & post-procedure evaluation of chronic illness with mild to moderate exacerbation(Level of risk: moderate) CC: Shoulder Pain (bilaterally) and Back Pain (lower-)  HPI  [name] is a 55 y.o. year old, female patient, who comes today for an initial evaluation. She has Encounter for therapeutic drug level monitoring; Encounter for long-term use of opiate analgesic; Opiate use (37.5 MME/Day); Cervical facet syndrome; Cervical spondylosis; Chronic neck pain (Location of Secondary source of pain) (Bilateral) (L>R); Chronic hip pain (Left); Lumbar facet syndrome; Lumbar spondylosis; Failed back surgical syndrome; Chronic low back pain; Fibromyalgia; Chronic pain syndrome; Bladder cystocele; H/O acute myocardial infarction; Right shoulder: S/P Rotator cuff repair; Chronic thumb pain (Bilateral); Upper extremity pain; Occipital neuralgia (Bilateral); Smoker; Generalized anxiety disorder; Depression; Wrist arthritis; Fracture five ribs-closed; History of seizures; History of migraine; H/O non-insulin dependent diabetes mellitus; Essential hypertension; High cholesterol; Coronary artery disease; History of cardiac arrhythmia; H/O vertigo; Vitamin D insufficiency; Chronic cervical radicular pain (Left); Chronic fatigue syndrome; Family history of chronic pain; History of knee surgery; History of elbow surgery; Long term current use of opiate analgesic; Long term prescription opiate use; Opioid dependence, daily use ([place]); Chronic pain; Right shoulder:  Rotator cuff Disorder; Abnormal MRI, shoulder (Right);  Right shoulder: Adhesive capsulitis; Right shoulder: Torn & retracted Infraspinatus tendon; Right shoulder: Torn & retracted Supraspinatus tendon (Complete, posterior full-thickness tear); Right shoulder: Subscapularis tendinopathy; Right shoulder: Biceps tendinopathy; Right shoulder: Avascular necrosis of humeral head ([place]); Right shoulder: Subacromial/Subdeltoid bursitis; Acromial fracture; Disturbance of skin sensation; S/P shoulder surgery (05/24/2016) (Right); COPD, moderate ([place]); Chronic pain of shoulders (Location of Primary Source of Pain) (Bilateral) ((R>L); and Long term prescription benzodiazepine use on her problem list.. Her primarily concern today is the Shoulder Pain (bilaterally) and Back Pain (lower-)  Pain Assessment: Self-Reported Pain Score: 7 /10 Clinically the patient looks like a 3/10 Reported level is inconsistent with clinical observations. Information on the proper use of the pain score provided to the patient today. Pain Type: Chronic pain Pain Location: Shoulder (states she hurts everywhere from fibromyglia) Pain Orientation: Right, Left Pain Descriptors / Indicators: Aching, Constant, Throbbing Pain Frequency: Constant  [name] was last seen on 09/17/2016 for a procedure. During today's appointment we reviewed Ms. Ocallaghan's post-procedure results, as well as her outpatient medication regimen.  Further details on both, my assessment(s), as well as the proposed treatment plan, please see below.  Controlled Substance Pharmacotherapy Assessment REMS (Risk Evaluation and Mitigation Strategy)  Analgesic:Oxycodone/APAP 5/325 one tablet 5 times a day (25 mg/day) MME/day:37.5 mg/day.  Ignatius Specking, RN  09/23/2016 11:22 AM  Sign at close encounter Nursing Pain Medication Assessment:  Safety precautions to be maintained throughout the outpatient stay will include: orient to surroundings, keep bed in low position, maintain call bell within reach at all times, provide  assistance with transfer out of bed and ambulation.  Medication Inspection Compliance: Ms. Jankovich did not comply with our request to bring her pills to be counted. She was reminded that bringing the medication bottles, even when empty, is a requirement. Pill Count: No pills available to be counted today. Bottle Appearance: No container available.  Did not bring bottle(s) to appointment. Medication: See above Filled Date: N/A Medication last intake: 09/21/16 Ran out of medication due to dental appointment Pharmacokinetics: Liberation and absorption (onset of action): WNL Distribution (time to peak effect): WNL Metabolism and excretion (duration of action): WNL         Pharmacodynamics: Desired effects: Analgesia: The patient reports >50% benefit. Reported improvement in function: The patient reports medication allows her to accomplish basic ADLs. Clinically meaningful improvement in function (CMIF): Sustained CMIF goals met Perceived effectiveness: Described as relatively effective, allowing for increase in activities of daily living (ADL) Undesirable effects: Side-effects or Adverse reactions: None reported Monitoring: [place] PMP: Online review of the past 12-monthperiod conducted. Compliant with practice rules and regulations List of all UDS test(s) done:  Lab Results  Component Value Date   [place] FINAL 08/19/2016   [place] 06[place]   [place] 03/19[place]2016   TOXASSSELUR FINAL 12/20/2015   TOXASSSELUR FINAL 11/13/2015   Last UDS on record: ToxAssure Select 13  Date Value Ref Range Status  08/19/2016 FINAL  Final    Comment:    ==================================================================== TOXASSURE SELECT 13 (MW) ==================================================================== Test                             Result       Flag       Units Drug Present and Declared for Prescription Verification   Desmethyldiazepam              1631          EXPECTED   ng/mg creat   Oxazepam                       1323         EXPECTED   ng/mg creat   Temazepam                      1080         EXPECTED   ng/mg creat    Desmethyldiazepam, oxazepam, and temazepam are expected    metabolites of diazepam. Desmethyldiazepam and oxazepam are also    expected metabolites of other drugs, including chlordiazepoxide,    prazepam, clorazepate, and halazepam. Oxazepam is an expected    metabolite of temazepam. Oxazepam and temazepam are also    available as scheduled prescription medications.   Oxycodone                      434          EXPECTED   ng/mg creat   Oxymorphone                    1869         EXPECTED   ng/mg creat   Noroxycodone                   3729         EXPECTED   ng/mg creat   Noroxymorphone                 223          EXPECTED   ng/mg creat    Sources of oxycodone are scheduled prescription medications.    Oxymorphone, noroxycodone, and noroxymorphone are expected    metabolites of oxycodone. Oxymorphone is also available as a    scheduled prescription medication.   Butalbital  PRESENT      EXPECTED ==================================================================== Test                      Result    Flag   Units      Ref Range   Creatinine              35               mg/dL      >=20 ==================================================================== Declared Medications:  The flagging and interpretation on this report are based on the  following declared medications.  Unexpected results may arise from  inaccuracies in the declared medications.  **Note: The testing scope of this panel includes these medications:  Butalbital (Butalbital/APAP/Caffeine)  Diazepam (Valium)  Oxycodone (Oxycodone Acetaminophen)  **Note: The testing scope of this panel does not include following  reported medications:  Acetaminophen (Butalbital/APAP/Caffeine)  Acetaminophen (Oxycodone Acetaminophen)  Albuterol  Albuterol  (Combivent)  Aspirin  Benzonatate (Tessalon)  Bisoprolol (Ziac)  Caffeine (Butalbital/APAP/Caffeine)  Calcium Carbonate (Calcium Carb/Vitamin D)  Cyclobenzaprine (Flexeril)  Dicyclomine (Bentyl)  Estradiol (Climara)  Fluticasone (Advair)  Hydrochlorothiazide  Hydrochlorothiazide (Ziac)  Ipratropium (Combivent)  Magnesium  Metoprolol (Lopressor)  Nitroglycerin  Paroxetine (Paxil)  Potassium  Promethazine  Rosuvastatin (Crestor)  Salmeterol (Advair)  Supplement  Vitamin B12  Vitamin D (Calcium Carb/Vitamin D)  Vitamin E ==================================================================== For clinical consultation, please call [phone]. ====================================================================    UDS interpretation: Compliant          Medication Assessment Form: Reviewed. Patient indicates being compliant with therapy Treatment compliance: Compliant Risk Assessment Profile: Aberrant behavior: See prior evaluations. None observed or detected today Comorbid factors increasing risk of overdose: See prior notes. No additional risks detected today Risk of substance use disorder (SUD): Low Opioid Risk Tool (ORT) Total Score: 4  Interpretation Table:  Score <3 = Low Risk for SUD  Score between 4-7 = Moderate Risk for SUD  Score >8 = High Risk for Opioid Abuse   Risk Mitigation Strategies:  Patient Counseling:  Covered Patient-Prescriber Agreement (PPA): Present and active  Notification to other healthcare providers: Done  Pharmacologic Plan: No change in therapy, at this time  Post-Procedure Assessment  Procedure done on 09/17/2016: Diagnostic Suprascapular nerve Block, without steroids BMI: 22.84 kg/m Technical challenges experienced at the time of the procedure: None Factor complicating assessment: None         Side-effects or adverse reactions: None reported Reported issues: None reported Sedation: Sedation provided. When no sedatives are used, the  analgesic levels obtained are directly associated to the effectiveness of the local anesthetics. However, when sedation is provided, the level of analgesia obtained during the initial 1 hour following the intervention, is believed to be the result of a combination of factors. These factors may include, but are not limited to: 1. The effectiveness of the local anesthetics used. 2. The effects of the analgesic(s) and/or anxiolytic(s) used. 3. The degree of discomfort experienced by the patient at the time of the procedure. 4. The patients ability and reliability in recalling and recording the events. 5. The presence and influence of possible secondary gains and/or psychosocial factors. Reported result: Relief experienced during the 1st hour after the procedure: 100 % (Ultra-Short Term Relief) Interpretative annotation: Analgesia during this period is likely to be Local Anesthetic and/or IV Sedative (Analgesic/Anxiolitic) related Effects of local anesthetic: The analgesic effects attained during this period are directly associated to the localized infiltration of local anesthetics and therefore cary significant diagnostic value  as to the etiological location, or anatomical origin, of the pain. Expected duration of relief is directly dependent on the pharmacodynamics of the local anesthetic used. Long-acting (4-6 hours) anesthetics used.  Reported result: Relief during the next 4 to 6 hour after the procedure: 50 % (Short-Term Relief) Interpretative annotation: Complete relief would suggest area to be the source of the pain Long-term benefit: Defined as the period of time past the expected duration of local anesthetics. With the possible exception of prolonged sympathetic blockade from the local anesthetics, benefits during this period are typically attributed to, or associated with, other factors such as analgesic sensory neuropraxia, antiinflammatory effects, or beneficial biochemical changes provided by  agents other than the local anesthetics Reported result: Extended relief following procedure: 0 % (Long-Term Relief) Interpretative annotation: No benefit. This could suggest algesic mechanism to be mechanical rather than inflammatory.         Current benefits: Defined as persistent relief that continues at this point in time.   Reported results: Treated area: 0% Interpretative annotation: Recurrance of symptoms. This would suggest persistent aggravating factors  Interpretation: Results would suggest Ms. Crabbe to be a good candidate for Radiofrequency Ablation. The patient has failed to respond to conservative therapies including over-the-counter medications, anti-inflammatories, muscle relaxants, membrane stabilizers, opioids, physical therapy, modalities such as heat and ice, as well as more invasive techniques such as nerve blocks. Because Ms. Holness did attain more than 50% relief of the pain during a series of diagnostic blocks conducted in separate occasions, I believe it is medically necessary to proceed with Radiofrequency Ablation, in order to attempt gaining longer relief.  Laboratory Chemistry  Inflammation Markers Lab Results  Component Value Date   ESRSEDRATE 13 06/06/2016   CRP 1.1 (H) 06/06/2016   Renal Function Lab Results  Component Value Date   BUN 12 06/06/2016   CREATININE 0.85 06/06/2016   GFRAA >60 06/06/2016   GFRNONAA >60 06/06/2016   Hepatic Function Lab Results  Component Value Date   AST 21 06/06/2016   ALT 17 06/06/2016   ALBUMIN 3.7 06/06/2016   Electrolytes Lab Results  Component Value Date   NA 142 06/06/2016   K 3.3 (L) 06/06/2016   CL 106 06/06/2016   CALCIUM 8.8 (L) 06/06/2016   MG 1.7 06/06/2016   Pain Modulating Vitamins Lab Results  Component Value Date   25OHVITD1 39 06/06/2016   25OHVITD2 2.2 06/06/2016   25OHVITD3 37 06/06/2016   VITAMINB12 593 06/06/2016   Coagulation Parameters Lab Results  Component Value Date   PLT 309  12/22/2015   Cardiovascular Lab Results  Component Value Date   HGB 14.8 12/22/2015   HCT 44.2 12/22/2015   Note: Lab results reviewed.  Recent Diagnostic Imaging Review  Dg C-arm 1-60 Min-no Report  Result Date: 09/17/2016 CLINICAL DATA: Assistance in needle guidance and placement for procedures requiring needle placement in or near specific anatomical locations not easily accessible without such assistance. C-ARM 1-60 MINUTES Fluoroscopy was utilized by the requesting physician.  No radiographic interpretation.   Note: Imaging results reviewed.  Meds  The patient has a current medication list which includes the following prescription(s): advair diskus, albuterol, albuterol-ipratropium, aspirin ec, benzonatate, bisoprolol-hydrochlorothiazide, butalbital-acetaminophen-caffeine, calcium carbonate-vit d-min, vitamin d, cinnamon, combivent respimat, cyanocobalamin, cyclobenzaprine, diazepam, dicyclomine, estradiol, fluticasone-salmeterol, hydrochlorothiazide, magnesium, metoprolol tartrate, nitroglycerin, oxycodone-acetaminophen, paroxetine, potassium, promethazine, promethazine-dextromethorphan, rosuvastatin, and vitamin e.  Current Outpatient Prescriptions on File Prior to Visit  Medication Sig  . ADVAIR DISKUS 500-50 MCG/DOSE AEPB   . albuterol (PROVENTIL) (  2.5 MG/3ML) 0.083% nebulizer solution as needed.   Marland Kitchen albuterol-ipratropium (COMBIVENT) 18-103 MCG/ACT inhaler Inhale 2 puffs into the lungs every 6 (six) hours as needed for wheezing or shortness of breath.  Marland Kitchen aspirin EC 81 MG tablet Take 81 mg by mouth daily.  . benzonatate (TESSALON) 200 MG capsule Take by mouth.  . bisoprolol-hydrochlorothiazide (ZIAC) 10-6.25 MG tablet Take 1 tablet by mouth daily.  . butalbital-acetaminophen-caffeine (FIORICET, ESGIC) 50-325-40 MG tablet Take 1 tablet by mouth every 4 (four) hours as needed for headache.  . Calcium Carbonate-Vit D-Min (CALCIUM 1200 PO) Take by mouth daily.  . Cholecalciferol  (VITAMIN D) 2000 UNITS tablet Take 1 tablet (2,000 Units total) by mouth daily.  Marland Kitchen Cinnamon ([employer] CINNAMON) 500 MG capsule Take 500 mg by mouth daily.  . COMBIVENT RESPIMAT 20-100 MCG/ACT AERS respimat 3 (three) times daily as needed.   . cyanocobalamin (,VITAMIN B-12,) 1000 MCG/ML injection Every 3 weeks  . cyclobenzaprine (FLEXERIL) 10 MG tablet Take 1 tablet (10 mg total) by mouth 3 (three) times daily. (Patient taking differently: Take 10 mg by mouth daily after breakfast. )  . diazepam (VALIUM) 5 MG tablet Take 2.5 mg by mouth every 8 (eight) hours as needed for anxiety (takes 1/2 - 1 tablet as needed for anxiety).   Marland Kitchen dicyclomine (BENTYL) 10 MG capsule take 1 capsule by mouth four times a day if needed for ABDOMINAL SPASM  . estradiol (CLIMARA - DOSED IN MG/24 HR) 0.1 mg/24hr patch Place 0.1 mg onto the skin once a week. On Wednesday  . Fluticasone-Salmeterol (ADVAIR) 100-50 MCG/DOSE AEPB Inhale 1 puff into the lungs 2 (two) times daily.  . hydrochlorothiazide (HYDRODIURIL) 12.5 MG tablet take 1 tablet by mouth once daily if needed for edema  . Magnesium 400 MG TABS Take 1 tablet by mouth 2 (two) times daily.   . metoprolol tartrate (LOPRESSOR) 25 MG tablet 25 mg 2 (two) times daily.   . nitroGLYCERIN (NITROSTAT) 0.4 MG SL tablet Place 0.4 mg under the tongue every 5 (five) minutes as needed for chest pain.  Marland Kitchen PARoxetine (PAXIL) 30 MG tablet Take 30 mg by mouth daily.  . Potassium 95 MG TABS Take 1 tablet by mouth daily.  . promethazine (PHENERGAN) 25 MG tablet Take 25 mg by mouth every 6 (six) hours as needed for nausea or vomiting.  . promethazine-dextromethorphan (PROMETHAZINE-DM) 6.25-15 MG/5ML syrup take 5 milliliters by mouth every 4 hours if needed for cough  . rosuvastatin (CRESTOR) 20 MG tablet Take 20 mg by mouth at bedtime.   . vitamin E 1000 UNIT capsule Take 1,000 Units by mouth daily.   No current facility-administered medications on file prior to visit.    ROS   Constitutional: Denies any fever or chills Gastrointestinal: No reported hemesis, hematochezia, vomiting, or acute GI distress Musculoskeletal: Denies any acute onset joint swelling, redness, loss of ROM, or weakness Neurological: No reported episodes of acute onset apraxia, aphasia, dysarthria, agnosia, amnesia, paralysis, loss of coordination, or loss of consciousness  Allergies  Ms. Callicott is allergic to cymbalta [duloxetine hcl]; duloxetine; flagyl [metronidazole]; lyrica [pregabalin]; vicodin [hydrocodone-acetaminophen]; nsaids; and tape.  [place]  Drug: Ms. Daponte  reports that she does not use drugs. Alcohol:  reports that she does not drink alcohol. Tobacco:  reports that she has been smoking Cigarettes.  She has been smoking about 0.25 packs per day. She has never used smokeless tobacco. Medical:  has a past medical history of Anxiety; Asthma; CAD (coronary artery disease); Chronic fatigue; Chronic  thumb pain, bilateral (09/12/2015); COPD (chronic obstructive pulmonary disease) ([place]); Coronary artery disease; Depression; DJD (degenerative joint disease); DJD (degenerative joint disease); Essential hypertension (09/12/2015); Family history of chronic pain (09/26/2015); Fibromyalgia; Fibromyalgia; Fracture five ribs-closed (09/12/2015); H/O acute myocardial infarction (12/14/2014); H/O non-insulin dependent diabetes mellitus (09/12/2015); History of cardiac arrhythmia (09/12/2015); History of elbow surgery (09/26/2015); History of knee surgery (09/26/2015); History of migraine (09/12/2015); History of seizures (09/12/2015); Hypercholesteremia; MI (myocardial infarction); Migraine; Migraine; Osteoarthritis; Right shoulder: Acute Pain due to trauma (04/18/2016); Right shoulder: Chronic Pain (09/12/2015); Seizures ([place]); 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 Family: family history includes Breast cancer in her cousin and maternal aunt.  Past Surgical History:  Procedure Laterality  Date  . ABDOMINAL HYSTERECTOMY    . KNEE ARTHROPLASTY Left   . left elbow surgery    . LUMBAR DISC SURGERY    . ROTATOR CUFF REPAIR Right   . TRIGGER FINGER RELEASE Left   . ureter mesh     Constitutional Exam  General appearance: Well nourished, well developed, and well hydrated. In no apparent acute distress Vitals:   09/23/16 1110  BP: (!) 150/87  Pulse: 92  Resp: 16  Temp: 98.4 F (36.9 C)  TempSrc: Oral  SpO2: 99%  Weight: 115 lb (52.2 kg)  Height: 4' 11.5" (1.511 m)   BMI Assessment: Estimated body mass index is 22.84 kg/m as calculated from the following:   Height as of this encounter: 4' 11.5" (1.511 m).   Weight as of this encounter: 115 lb (52.2 kg).  BMI interpretation table: BMI level Category Range association with higher incidence of chronic pain  <18 kg/m2 Underweight   18.5-24.9 kg/m2 Ideal body weight   25-29.9 kg/m2 Overweight Increased incidence by 20%  30-34.9 kg/m2 Obese (Class I) Increased incidence by 68%  35-39.9 kg/m2 Severe obesity (Class II) Increased incidence by 136%  >40 kg/m2 Extreme obesity (Class III) Increased incidence by 254%   BMI Readings from Last 4 Encounters:  09/23/16 22.84 kg/m  09/17/16 23.23 kg/m  08/19/16 23.83 kg/m  08/15/16 23.83 kg/m   Wt Readings from Last 4 Encounters:  09/23/16 115 lb (52.2 kg)  09/17/16 115 lb (52.2 kg)  08/19/16 118 lb (53.5 kg)  08/15/16 118 lb (53.5 kg)  Psych/Mental status: Alert, oriented x 3 (person, place, & time) Eyes: PERLA Respiratory: No evidence of acute respiratory distress  Cervical Spine Exam  Inspection: No masses, redness, or swelling Alignment: Symmetrical Functional ROM: Unrestricted ROM Stability: No instability detected Muscle strength & Tone: Functionally intact Sensory: Unimpaired Palpation: Non-contributory  Upper Extremity (UE) Exam    Side: Right upper extremity  Side: Left upper extremity  Inspection: No masses, redness, swelling, or asymmetry   Inspection: No masses, redness, swelling, or asymmetry  Functional ROM: Limited ROMfor shoulder joint  Functional ROM: Limited ROM for shoulder joint  Muscle strength & Tone: Guarding  Muscle strength & Tone: Guarding  Sensory: Articular pain pattern  Sensory: Articular pain pattern  Palpation: Tender  Palpation: Complains of area being tender to palpation   Thoracic Spine Exam  Inspection: No masses, redness, or swelling Alignment: Symmetrical Functional ROM: Unrestricted ROM Stability: No instability detected Sensory: Unimpaired Muscle strength & Tone: Functionally intact Palpation: Non-contributory  Lumbar Spine Exam  Inspection: No masses, redness, or swelling Alignment: Symmetrical Functional ROM: Unrestricted ROM Stability: No instability detected Muscle strength & Tone: Functionally intact Sensory: Unimpaired Palpation: Non-contributory Provocative Tests: Lumbar Hyperextension and rotation test: evaluation deferred today       Patrick's Maneuver:  evaluation deferred today              Gait & Posture Assessment  Ambulation: Unassisted Gait: Relatively normal for age and body habitus Posture: WNL   Lower Extremity Exam    Side: Right lower extremity  Side: Left lower extremity  Inspection: No masses, redness, swelling, or asymmetry  Inspection: No masses, redness, swelling, or asymmetry  Functional ROM: Unrestricted ROM          Functional ROM: Unrestricted ROM          Muscle strength & Tone: Functionally intact  Muscle strength & Tone: Functionally intact  Sensory: Unimpaired  Sensory: Unimpaired  Palpation: Non-contributory  Palpation: Non-contributory   Assessment  Primary Diagnosis & Pertinent Problem List: The primary encounter diagnosis was Chronic pain syndrome. A diagnosis of Chronic pain of shoulders (Location of Primary Source of Pain) (Bilateral) ((R>L) was also pertinent to this visit.  Visit Diagnosis: 1. Chronic pain syndrome   2. Chronic pain of  shoulders (Location of Primary Source of Pain) (Bilateral) ((R>L)    Plan of Care  Pharmacotherapy (Medications Ordered): Meds ordered this encounter  Medications  . oxyCODONE-acetaminophen (PERCOCET/ROXICET) 5-325 MG tablet    Sig: Take 1 tablet by mouth 5 (five) times daily as needed for severe pain.    Dispense:  150 tablet    Refill:  0    Do not add this medication to the electronic "Automatic Refill" notification system. Patient may have prescription filled one day early if pharmacy is closed on scheduled refill date. Do not fill until: 09/27/16 To last until: 10/27/16   New Prescriptions   No medications on file   Medications administered during this visit: Ms. Welte had no medications administered during this visit. Lab-work, Procedure(s), & Referral(s) Ordered: Orders Placed This Encounter  Procedures  . Radiofrequency Shoulder Joint   Imaging & Referral(s) Ordered: None  Interventional Therapies: Pending/Scheduled/Planned:   Right sided suprascapular nerve radiofrequency ablation under fluoroscopic guidance and IV sedation.    Considering:   Bilateral suprascapular RFA  Repeat bilateral cervical facet block under fluoroscopic guidance and IV sedation  Possible bilateral cervical facet radiofrequency ablation under fluoroscopic guidance and IV sedation.    PRN Procedures:   None at this time.    Requested PM Follow-up: Return in 1 month (on 10/24/2016) for Med-Mgmt, Schedule Procedure, (ASAA).  No future appointments. Primary Care Physician: Lynnell Jude, MD Location: [facility] Note by: Kathlen Brunswick Dossie Arbour, M.D, DABA, DABAPM, DABPM, DABIPP, FIPP  Pain Score Disclaimer: We use the NRS-11 scale. This is a self-reported, subjective measurement of pain severity with only modest accuracy. It is used primarily to identify changes within a particular patient. It must be understood that outpatient pain scales are significantly less  accurate that those used for research, where they can be applied under ideal controlled circumstances with minimal exposure to variables. In reality, the score is likely to be a combination of pain intensity and pain affect, where pain affect describes the degree of emotional arousal or changes in action readiness caused by the sensory experience of pain. Factors such as social and work situation, setting, emotional state, anxiety levels, expectation, and prior pain experience may influence pain perception and show large inter-individual differences that may also be affected by time variables.  Patient instructions provided during this appointment: Patient Instructions   GENERAL RISKS AND COMPLICATIONS  What are the risk, side effects and possible complications? Generally speaking, most procedures are safe.  However,  with any procedure there are risks, side effects, and the possibility of complications.  The risks and complications are dependent upon the sites that are lesioned, or the type of nerve block to be performed.  The closer the procedure is to the spine, the more serious the risks are.  Great care is taken when placing the radio frequency needles, block needles or lesioning probes, but sometimes complications can occur. 1. Infection: Any time there is an injection through the skin, there is a risk of infection.  This is why sterile conditions are used for these blocks.  There are four possible types of infection. 1. Localized skin infection. 2. Central Nervous System Infection-This can be in the form of Meningitis, which can be deadly. 3. Epidural Infections-This can be in the form of an epidural abscess, which can cause pressure inside of the spine, causing compression of the spinal cord with subsequent paralysis. This would require an emergency surgery to decompress, and there are no guarantees that the patient would recover from the paralysis. 4. Discitis-This is an infection of the  intervertebral discs.  It occurs in about 1% of discography procedures.  It is difficult to treat and it may lead to surgery.        2. Pain: the needles have to go through skin and soft tissues, will cause soreness.       3. Damage to internal structures:  The nerves to be lesioned may be near blood vessels or    other nerves which can be potentially damaged.       4. Bleeding: Bleeding is more common if the patient is taking blood thinners such as  aspirin, Coumadin, Ticiid, Plavix, etc., or if he/she have some genetic predisposition  such as hemophilia. Bleeding into the spinal canal can cause compression of the spinal  cord with subsequent paralysis.  This would require an emergency surgery to  decompress and there are no guarantees that the patient would recover from the  paralysis.       5. Pneumothorax:  Puncturing of a lung is a possibility, every time a needle is introduced in  the area of the chest or upper back.  Pneumothorax refers to free air around the  collapsed lung(s), inside of the thoracic cavity (chest cavity).  Another two possible  complications related to a similar event would include: Hemothorax and Chylothorax.   These are variations of the Pneumothorax, where instead of air around the collapsed  lung(s), you may have blood or chyle, respectively.       6. Spinal headaches: They may occur with any procedures in the area of the spine.       7. Persistent CSF (Cerebro-Spinal Fluid) leakage: This is a rare problem, but may occur  with prolonged intrathecal or epidural catheters either due to the formation of a fistulous  track or a dural tear.       8. Nerve damage: By working so close to the spinal cord, there is always a possibility of  nerve damage, which could be as serious as a permanent spinal cord injury with  paralysis.       9. Death:  Although rare, severe deadly allergic reactions known as "Anaphylactic  reaction" can occur to any of the medications used.       10. Worsening of the symptoms:  We can always make thing worse.  What are the chances of something like this happening? Chances of any of this occuring are extremely low.  By  statistics, you have more of a chance of getting killed in a motor vehicle accident: while driving to the hospital than any of the above occurring .  Nevertheless, you should be aware that they are possibilities.  In general, it is similar to taking a shower.  Everybody knows that you can slip, hit your head and get killed.  Does that mean that you should not shower again?  Nevertheless always keep in mind that statistics do not mean anything if you happen to be on the wrong side of them.  Even if a procedure has a 1 (one) in a 1,000,000 (million) chance of going wrong, it you happen to be that one..Also, keep in mind that by statistics, you have more of a chance of having something go wrong when taking medications.  Who should not have this procedure? If you are on a blood thinning medication (e.g. Coumadin, Plavix, see list of "Blood Thinners"), or if you have an active infection going on, you should not have the procedure.  If you are taking any blood thinners, please inform your physician.  How should I prepare for this procedure?  Do not eat or drink anything at least six hours prior to the procedure.  Bring a driver with you .  It cannot be a taxi.  Come accompanied by an adult that can drive you back, and that is strong enough to help you if your legs get weak or numb from the local anesthetic.  Take all of your medicines the morning of the procedure with just enough water to swallow them.  If you have diabetes, make sure that you are scheduled to have your procedure done first thing in the morning, whenever possible.  If you have diabetes, take only half of your insulin dose and notify our nurse that you have done so as soon as you arrive at the clinic.  If you are diabetic, but only take blood sugar pills (oral  hypoglycemic), then do not take them on the morning of your procedure.  You may take them after you have had the procedure.  Do not take aspirin or any aspirin-containing medications, at least eleven (11) days prior to the procedure.  They may prolong bleeding.  Wear loose fitting clothing that may be easy to take off and that you would not mind if it got stained with Betadine or blood.  Do not wear any jewelry or perfume  Remove any nail coloring.  It will interfere with some of our monitoring equipment.  NOTE: Remember that this is not meant to be interpreted as a complete list of all possible complications.  Unforeseen problems may occur.  BLOOD THINNERS The following drugs contain aspirin or other products, which can cause increased bleeding during surgery and should not be taken for 2 weeks prior to and 1 week after surgery.  If you should need take something for relief of minor pain, you may take acetaminophen which is found in Tylenol,m Datril, Anacin-3 and Panadol. It is not blood thinner. The products listed below are.  Do not take any of the products listed below in addition to any listed on your instruction sheet.  A.P.C or A.P.C with Codeine Codeine Phosphate Capsules #3 Ibuprofen Ridaura  ABC compound Congesprin Imuran rimadil  Advil Cope Indocin Robaxisal  Alka-Seltzer Effervescent Pain Reliever and Antacid Coricidin or Coricidin-D  Indomethacin Rufen  Alka-Seltzer plus Cold Medicine Cosprin Ketoprofen S-A-C Tablets  Anacin Analgesic Tablets or Capsules Coumadin Korlgesic Salflex  Anacin Extra Strength  Analgesic tablets or capsules CP-2 Tablets Lanoril Salicylate  Anaprox Cuprimine Capsules Levenox Salocol  Anexsia-D Dalteparin Magan Salsalate  Anodynos Darvon compound Magnesium Salicylate Sine-off  Ansaid Dasin Capsules Magsal Sodium Salicylate  Anturane Depen Capsules Marnal Soma  APF Arthritis pain formula Dewitt's Pills Measurin Stanback  Argesic Dia-Gesic Meclofenamic  Sulfinpyrazone  Arthritis Bayer Timed Release Aspirin Diclofenac Meclomen Sulindac  Arthritis pain formula Anacin Dicumarol Medipren Supac  Analgesic (Safety coated) Arthralgen Diffunasal Mefanamic Suprofen  Arthritis Strength Bufferin Dihydrocodeine Mepro Compound Suprol  Arthropan liquid Dopirydamole Methcarbomol with Aspirin Synalgos  ASA tablets/Enseals Disalcid Micrainin Tagament  Ascriptin Doan's Midol Talwin  Ascriptin A/D Dolene Mobidin Tanderil  Ascriptin Extra Strength Dolobid Moblgesic Ticlid  Ascriptin with Codeine Doloprin or Doloprin with Codeine Momentum Tolectin  Asperbuf Duoprin Mono-gesic Trendar  Aspergum Duradyne Motrin or Motrin IB Triminicin  Aspirin plain, buffered or enteric coated Durasal Myochrisine Trigesic  Aspirin Suppositories Easprin Nalfon Trillsate  Aspirin with Codeine Ecotrin Regular or Extra Strength Naprosyn Uracel  Atromid-S Efficin Naproxen Ursinus  Auranofin Capsules Elmiron Neocylate Vanquish  Axotal Emagrin Norgesic Verin  Azathioprine Empirin or Empirin with Codeine Normiflo Vitamin E  Azolid Emprazil Nuprin Voltaren  Bayer Aspirin plain, buffered or children's or timed BC Tablets or powders Encaprin Orgaran Warfarin Sodium  Buff-a-Comp Enoxaparin Orudis Zorpin  Buff-a-Comp with Codeine Equegesic Os-Cal-Gesic   Buffaprin Excedrin plain, buffered or Extra Strength Oxalid   Bufferin Arthritis Strength Feldene Oxphenbutazone   Bufferin plain or Extra Strength Feldene Capsules Oxycodone with Aspirin   Bufferin with Codeine Fenoprofen Fenoprofen Pabalate or Pabalate-SF   Buffets II Flogesic Panagesic   Buffinol plain or Extra Strength Florinal or Florinal with Codeine Panwarfarin   Buf-Tabs Flurbiprofen Penicillamine   Butalbital Compound Four-way cold tablets Penicillin   Butazolidin Fragmin Pepto-Bismol   Carbenicillin Geminisyn Percodan   Carna Arthritis Reliever Geopen Persantine   Carprofen Gold's salt Persistin   Chloramphenicol Goody's  Phenylbutazone   Chloromycetin Haltrain Piroxlcam   Clmetidine heparin Plaquenil   Cllnoril Hyco-pap Ponstel   Clofibrate Hydroxy chloroquine Propoxyphen         Before stopping any of these medications, be sure to consult the physician who ordered them.  Some, such as Coumadin (Warfarin) are ordered to prevent or treat serious conditions such as "deep thrombosis", "pumonary embolisms", and other heart problems.  The amount of time that you may need off of the medication may also vary with the medication and the reason for which you were taking it.  If you are taking any of these medications, please make sure you notify your pain physician before you undergo any procedures.         Radiofrequency Lesioning Radiofrequency lesioning is a procedure that is performed to relieve pain. The procedure is often used for back, neck, or arm pain. Radiofrequency lesioning involves the use of a machine that creates radio waves to make heat. During the procedure, the heat is applied to the nerve that carries the pain signal. The heat damages the nerve and interferes with the pain signal. Pain relief usually lasts for 6 months to 1 year. LET [facility] CARE PROVIDER KNOW ABOUT:  Any allergies you have.  All medicines you are taking, including vitamins, herbs, eye drops, creams, and over-the-counter medicines.  Previous problems you or members of your family have had with the use of anesthetics.  Any blood disorders you have.  Previous surgeries you have had.  Any medical conditions you have.  Whether you are pregnant or may be pregnant.  RISKS AND COMPLICATIONS Generally, this is a safe procedure. However, problems may occur, including:  Pain or soreness at the injection site.  Infection at the injection site.  Damage to nerves or blood vessels. BEFORE THE PROCEDURE  Ask your health care provider about:  Changing or stopping your regular medicines. This is especially important if you  are taking diabetes medicines or blood thinners.  Taking medicines such as aspirin and ibuprofen. These medicines can thin your blood. Do not take these medicines before your procedure if your health care provider instructs you not to.  Follow instructions from your health care provider about eating or drinking restrictions.  Plan to have someone take you home after the procedure.  If you go home right after the procedure, plan to have someone with you for 24 hours. PROCEDURE  You will be given one or more of the following:  A medicine to help you relax (sedative).  A medicine to numb the area (local anesthetic).  You will be awake during the procedure. You will need to be able to talk with the health care provider during the procedure.  With the help of a type of X-ray (fluoroscopy), the health care provider will insert a radiofrequency needle into the area to be treated.  Next, a wire that carries the radio waves (electrode) will be put through the radiofrequency needle. An electrical pulse will be sent through the electrode to verify the correct nerve. You will feel a tingling sensation, and you may have muscle twitching.  Then, the tissue that is around the needle tip will be heated by an electric current that is passed using the radiofrequency machine. This will numb the nerves.  A bandage (dressing) will be put on the insertion area after the procedure is done. The procedure may vary among health care providers and hospitals. AFTER THE PROCEDURE  Your blood pressure, heart rate, breathing rate, and blood oxygen level will be monitored often until the medicines you were given have worn off.  Return to your normal activities as directed by your health care provider.   This information is not intended to replace advice given to you by your health care provider. Make sure you discuss any questions you have with your health care provider.   Document Released: 07/17/2011 Document  Revised: 08/09/2015 Document Reviewed: 12/26/2014 Elsevier Interactive Patient Education [employer].

## 2016-09-23 NOTE — Progress Notes (Signed)
Nursing Pain Medication Assessment:  Safety precautions to be maintained throughout the outpatient stay will include: orient to surroundings, keep bed in low position, maintain call bell within reach at all times, provide assistance with transfer out of bed and ambulation.  Medication Inspection Compliance: Michelle Newton did not comply with our request to bring her pills to be counted. She was reminded that bringing the medication bottles, even when empty, is a requirement. Pill Count: No pills available to be counted today. Bottle Appearance: No container available. Did not bring bottle(s) to appointment. Medication: See above Filled Date: N/A Medication last intake: 09/21/16 Ran out of medication due to dental appointment

## 2016-10-01 ENCOUNTER — Telehealth: Payer: Self-pay

## 2016-10-01 NOTE — Telephone Encounter (Signed)
The PA wants to refer her to a biofeedback pain clinic for alternative treatment, not meds. She would like Dr. Waynetta SandyNaveira's opinion on this. Also, she said that it had been long enough since her surgery that she could start getting steriods in her injections now, instead of just lidocaine.

## 2016-10-08 NOTE — Telephone Encounter (Signed)
Perfect make it so.

## 2016-10-10 ENCOUNTER — Telehealth: Payer: Self-pay | Admitting: *Deleted

## 2016-10-12 ENCOUNTER — Emergency Department: Payer: Medicare Other

## 2016-10-12 ENCOUNTER — Emergency Department
Admission: EM | Admit: 2016-10-12 | Discharge: 2016-10-12 | Disposition: A | Discharge status: Home / Self Care | Payer: Medicare Other | Attending: Emergency Medicine | Admitting: Emergency Medicine

## 2016-10-12 ENCOUNTER — Encounter: Payer: Self-pay | Admitting: Emergency Medicine

## 2016-10-12 DIAGNOSIS — J45909 Unspecified asthma, uncomplicated: Secondary | ICD-10-CM | POA: Diagnosis not present

## 2016-10-12 DIAGNOSIS — W1839XA Other fall on same level, initial encounter: Secondary | ICD-10-CM | POA: Insufficient documentation

## 2016-10-12 DIAGNOSIS — F17210 Nicotine dependence, cigarettes, uncomplicated: Secondary | ICD-10-CM | POA: Diagnosis not present

## 2016-10-12 DIAGNOSIS — Y999 Unspecified external cause status: Secondary | ICD-10-CM | POA: Insufficient documentation

## 2016-10-12 DIAGNOSIS — M79671 Pain in right foot: Secondary | ICD-10-CM | POA: Diagnosis not present

## 2016-10-12 DIAGNOSIS — W19XXXA Unspecified fall, initial encounter: Secondary | ICD-10-CM

## 2016-10-12 DIAGNOSIS — Y9389 Activity, other specified: Secondary | ICD-10-CM | POA: Insufficient documentation

## 2016-10-12 DIAGNOSIS — J449 Chronic obstructive pulmonary disease, unspecified: Secondary | ICD-10-CM | POA: Diagnosis not present

## 2016-10-12 DIAGNOSIS — I10 Essential (primary) hypertension: Secondary | ICD-10-CM | POA: Insufficient documentation

## 2016-10-12 DIAGNOSIS — Y929 Unspecified place or not applicable: Secondary | ICD-10-CM | POA: Diagnosis not present

## 2016-10-12 DIAGNOSIS — S8012XA Contusion of left lower leg, initial encounter: Secondary | ICD-10-CM | POA: Insufficient documentation

## 2016-10-12 DIAGNOSIS — S7002XA Contusion of left hip, initial encounter: Secondary | ICD-10-CM | POA: Insufficient documentation

## 2016-10-12 DIAGNOSIS — Z79899 Other long term (current) drug therapy: Secondary | ICD-10-CM | POA: Insufficient documentation

## 2016-10-12 DIAGNOSIS — Z7982 Long term (current) use of aspirin: Secondary | ICD-10-CM | POA: Diagnosis not present

## 2016-10-12 DIAGNOSIS — E119 Type 2 diabetes mellitus without complications: Secondary | ICD-10-CM | POA: Insufficient documentation

## 2016-10-12 DIAGNOSIS — I2510 Atherosclerotic heart disease of native coronary artery without angina pectoris: Secondary | ICD-10-CM | POA: Insufficient documentation

## 2016-10-12 DIAGNOSIS — S7012XA Contusion of left thigh, initial encounter: Secondary | ICD-10-CM | POA: Diagnosis not present

## 2016-10-12 DIAGNOSIS — S79912A Unspecified injury of left hip, initial encounter: Secondary | ICD-10-CM | POA: Diagnosis present

## 2016-10-12 DIAGNOSIS — S9032XA Contusion of left foot, initial encounter: Secondary | ICD-10-CM

## 2016-10-12 MED ORDER — TRAMADOL HCL 50 MG PO TABS: 50.0000 mg | ORAL_TABLET | Freq: Four times a day (QID) | ORAL | 0 refills | Status: DC | PRN

## 2016-10-12 MED ORDER — KETOROLAC TROMETHAMINE 30 MG/ML IJ SOLN
30.0000 mg | Freq: Once | INTRAMUSCULAR | Status: AC
  Administered 2016-10-12: 30 mg via INTRAMUSCULAR
  Filled 2016-10-12: qty 1

## 2016-10-12 NOTE — ED Triage Notes (Signed)
States turned and lost balance and fell, denies LOC. States pain L hip and ankle.

## 2016-10-12 NOTE — ED Provider Notes (Signed)
[facility] Emergency Department Provider Note  ____________________________________________  Time seen: Approximately 12:06 PM  I have reviewed the triage vital signs and the nursing notes.   HISTORY  Chief Complaint Fall    HPI BELLARAE LIZER is a 55 y.o. female , NAD, presents to the emergency department with left hip and foot pain after a fall early this morning. Patient states she went into her front yard to decorating for the holidays. States she lost her balance and twisted her left foot and hip. States she fell to the ground onto her left side. Has had pain about the left hip, left foot and right foot since the incident. Denies any open wounds, lacerations or bleeding. Denies head injury, LOC, dizziness, visual changes, neck pain, back pain, pelvic pain. Has had no saddle paresthesias or loss of bowel or bladder control. Denies any numbness, weakness, tingling. Was able to stand and ambulate to her vehicle and drive herself to the emergency department for evaluation. Has not taken anything for pain at this time.   Past Medical History:  Diagnosis Date  . Anxiety   . Asthma   . CAD (coronary artery disease)   . Chronic fatigue   . Chronic thumb pain, bilateral 09/12/2015  . COPD (chronic obstructive pulmonary disease) (HCC)   . Coronary artery disease   . Depression   . DJD (degenerative joint disease)   . DJD (degenerative joint disease)   . Essential hypertension 09/12/2015  . Family history of chronic pain 09/26/2015  . Fibromyalgia   . Fibromyalgia   . Fracture five ribs-closed 09/12/2015  . H/O acute myocardial infarction 12/14/2014  . H/O non-insulin dependent diabetes mellitus 09/12/2015  . History of cardiac arrhythmia 09/12/2015  . History of elbow surgery 09/26/2015   Left ulnar nerve transposition  . History of knee surgery 09/26/2015  . History of migraine 09/12/2015  . History of seizures 09/12/2015  . Hypercholesteremia   . MI  (myocardial infarction)   . Migraine   . Migraine   . Osteoarthritis   . Right shoulder: Acute Pain due to trauma 04/18/2016  . Right shoulder: Chronic Pain 09/12/2015  . Seizures (HCC)   . Ulcerative (chronic) enterocolitis (HCC)   . Vertigo     Patient Active Problem List   Diagnosis Date Noted  . Long term prescription benzodiazepine use 09/17/2016  . Chronic pain of shoulders (Location of Primary Source of Pain) (Bilateral) ((R>L) 07/23/2016  . Disturbance of skin sensation 06/05/2016  . S/P shoulder surgery (05/24/2016) (Right) 06/05/2016  . Acromial fracture 05/09/2016  . Abnormal MRI, shoulder (Right) 04/18/2016  . Right shoulder: Adhesive capsulitis 04/18/2016  . Right shoulder: Torn & retracted Infraspinatus tendon 04/18/2016  . Right shoulder: Torn & retracted Supraspinatus tendon (Complete, posterior full-thickness tear) 04/18/2016  . Right shoulder: Subscapularis tendinopathy 04/18/2016  . Right shoulder: Biceps tendinopathy 04/18/2016  . Right shoulder: Avascular necrosis of humeral head (HCC) 04/18/2016  . Right shoulder: Subacromial/Subdeltoid bursitis 04/18/2016  . Right shoulder:  Rotator cuff Disorder 02/22/2016  . Long term current use of opiate analgesic 11/13/2015  . Long term prescription opiate use 11/13/2015  . Opioid dependence, daily use (HCC) 11/13/2015  . Chronic pain 11/13/2015  . Chronic cervical radicular pain (Left) 09/26/2015  . Chronic fatigue syndrome 09/26/2015  . Family history of chronic pain 09/26/2015  . History of knee surgery 09/26/2015  . History of elbow surgery 09/26/2015  . Vitamin D insufficiency 09/21/2015  . Encounter for therapeutic drug level monitoring  09/12/2015  . Encounter for long-term use of opiate analgesic 09/12/2015  . Opiate use (37.5 MME/Day) 09/12/2015  . Cervical facet syndrome 09/12/2015  . Cervical spondylosis 09/12/2015  . Chronic neck pain (Location of Secondary source of pain) (Bilateral) (L>R) 09/12/2015   . Chronic hip pain (Left) 09/12/2015  . Lumbar facet syndrome 09/12/2015  . Lumbar spondylosis 09/12/2015  . 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 Chronic low back pain 09/12/2015  . Fibromyalgia 09/12/2015  . Chronic pain syndrome 09/12/2015  . Chronic thumb pain (Bilateral) 09/12/2015  . Upper extremity pain 09/12/2015  . Occipital neuralgia (Bilateral) 09/12/2015  . Smoker 09/12/2015  . Generalized anxiety disorder 09/12/2015  . Depression 09/12/2015  . Wrist arthritis 09/12/2015  . Fracture five ribs-closed 09/12/2015  . History of seizures 09/12/2015  . History of migraine 09/12/2015  . H/O non-insulin dependent diabetes mellitus 09/12/2015  . Essential hypertension 09/12/2015  . High cholesterol 09/12/2015  . Coronary artery disease 09/12/2015  . History of cardiac arrhythmia 09/12/2015  . H/O vertigo 09/12/2015  . H/O acute myocardial infarction 12/14/2014  . COPD, moderate (HCC) 12/14/2014  . Right shoulder: S/P Rotator cuff repair 11/15/2014  . Bladder cystocele 07/29/2014    Past Surgical History:  Procedure Laterality Date  . ABDOMINAL HYSTERECTOMY    . KNEE ARTHROPLASTY Left   . left elbow surgery    . LUMBAR DISC SURGERY    . ROTATOR CUFF REPAIR Right   . TRIGGER FINGER RELEASE Left   . ureter mesh      Prior to Admission medications   Medication Sig Start Date End Date Taking? Authorizing Provider  ADVAIR DISKUS 500-50 MCG/DOSE AEPB  08/13/16   Historical Provider, MD  albuterol (PROVENTIL) (2.5 MG/3ML) 0.083% nebulizer solution as needed.  12/28/14   Historical Provider, MD  albuterol-ipratropium (COMBIVENT) 18-103 MCG/ACT inhaler Inhale 2 puffs into the lungs every 6 (six) hours as needed for wheezing or shortness of breath.    Historical Provider, MD  aspirin EC 81 MG tablet Take 81 mg by mouth daily.    Historical Provider, MD  benzonatate (TESSALON) 200 MG capsule Take by mouth. 02/04/15   Historical Provider, MD  bisoprolol-hydrochlorothiazide  (ZIAC) 10-6.25 MG tablet Take 1 tablet by mouth daily.    Historical Provider, MD  butalbital-acetaminophen-caffeine (FIORICET, ESGIC) 50-325-40 MG tablet Take 1 tablet by mouth every 4 (four) hours as needed for headache.    Historical Provider, MD  Calcium Carbonate-Vit D-Min (CALCIUM 1200 PO) Take by mouth daily.    Historical Provider, MD  Cholecalciferol (VITAMIN D) 2000 UNITS tablet Take 1 tablet (2,000 Units total) by mouth daily. 09/21/15   Delano MetzFrancisco Naveira, MD  Cinnamon ([facility] CINNAMON) 500 MG capsule Take 500 mg by mouth daily.    Historical Provider, MD  COMBIVENT RESPIMAT 20-100 MCG/ACT AERS respimat 3 (three) times daily as needed.  06/05/16   Historical Provider, MD  cyanocobalamin (,VITAMIN B-12,) 1000 MCG/ML injection Every 3 weeks 10/31/14   Historical Provider, MD  cyclobenzaprine (FLEXERIL) 10 MG tablet Take 1 tablet (10 mg total) by mouth 3 (three) times daily. Patient taking differently: Take 10 mg by mouth daily after breakfast.  09/17/16   Delano MetzFrancisco Naveira, MD  diazepam (VALIUM) 5 MG tablet Take 2.5 mg by mouth every 8 (eight) hours as needed for anxiety (takes 1/2 - 1 tablet as needed for anxiety).     Historical Provider, MD  dicyclomine (BENTYL) 10 MG capsule take 1 capsule by mouth four times a day if needed for  ABDOMINAL SPASM 10/28/15   Historical Provider, MD  estradiol (CLIMARA - DOSED IN MG/24 HR) 0.1 mg/24hr patch Place 0.1 mg onto the skin once a week. On Wednesday    Historical Provider, MD  Fluticasone-Salmeterol (ADVAIR) 100-50 MCG/DOSE AEPB Inhale 1 puff into the lungs 2 (two) times daily.    Historical Provider, MD  hydrochlorothiazide (HYDRODIURIL) 12.5 MG tablet take 1 tablet by mouth once daily if needed for edema 06/14/16   Historical Provider, MD  Magnesium 400 MG TABS Take 1 tablet by mouth 2 (two) times daily.     Historical Provider, MD  metoprolol tartrate (LOPRESSOR) 25 MG tablet 25 mg 2 (two) times daily.  07/28/16   Historical Provider, MD  nitroGLYCERIN  (NITROSTAT) 0.4 MG SL tablet Place 0.4 mg under the tongue every 5 (five) minutes as needed for chest pain.    Historical Provider, MD  oxyCODONE-acetaminophen (PERCOCET/ROXICET) 5-325 MG tablet Take 1 tablet by mouth 5 (five) times daily as needed for severe pain. 09/27/16 10/27/16  Delano Metz, MD  PARoxetine (PAXIL) 30 MG tablet Take 30 mg by mouth daily.    Historical Provider, MD  Potassium 95 MG TABS Take 1 tablet by mouth daily.    Historical Provider, MD  promethazine (PHENERGAN) 25 MG tablet Take 25 mg by mouth every 6 (six) hours as needed for nausea or vomiting.    Historical Provider, MD  promethazine-dextromethorphan (PROMETHAZINE-DM) 6.25-15 MG/5ML syrup take 5 milliliters by mouth every 4 hours if needed for cough 08/23/16   Historical Provider, MD  rosuvastatin (CRESTOR) 20 MG tablet Take 20 mg by mouth at bedtime.     Historical Provider, MD  traMADol (ULTRAM) 50 MG tablet Take 1 tablet (50 mg total) by mouth every 6 (six) hours as needed. 10/12/16   Jami L Hagler, PA-C  vitamin E 1000 UNIT capsule Take 1,000 Units by mouth daily.    Historical Provider, MD    Allergies Cymbalta [duloxetine hcl]; Duloxetine; Flagyl [metronidazole]; Lyrica [pregabalin]; Vicodin [hydrocodone-acetaminophen]; Nsaids; and Tape  Family History  Problem Relation Age of Onset  . Breast cancer Cousin   . Breast cancer Maternal Aunt     Social History Social History  Substance Use Topics  . Smoking status: Current Every Day Smoker    Packs/day: 0.25    Types: Cigarettes  . Smokeless tobacco: Never Used  . Alcohol use No     Review of Systems  Constitutional: No fatigue Eyes: No visual changes.  Cardiovascular: No chest pain. Respiratory: No shortness of breath.  Gastrointestinal: No abdominal pain.  No nausea, vomiting.   Musculoskeletal: Positive left hip, left foot and right foot pain. Negative for back, neck pain.  Skin: Positive bruising left foot and left lower leg. Negative for  rash, redness, swelling, edema, open wounds or lacerations. Neurological: Negative for headaches, focal weakness or numbness. No tingling. No saddle paresthesias or loss of bowel or bladder control. 10-point ROS otherwise negative.  ____________________________________________   PHYSICAL EXAM:  VITAL SIGNS: ED Triage Vitals [10/12/16 1127]  Enc Vitals Group     BP 121/77     Pulse Rate 78     Resp 18     Temp 97.6 F (36.4 C)     Temp Source Oral     SpO2 95 %     Weight 115 lb (52.2 kg)     Height 4\' 11"  (1.499 m)     Head Circumference      Peak Flow      Pain  Score 8     Pain Loc      Pain Edu?      Excl. in GC?      Constitutional: Alert and oriented. Well appearing and in no acute distress. Eyes: Conjunctivae are normal.  Head: Atraumatic. Neck: Supple with full range of motion. Hematological/Lymphatic/Immunilogical: No cervical lymphadenopathy. Cardiovascular: Normal rate, regular rhythm. Normal S1 and S2.  Good peripheral circulation. Respiratory: Normal respiratory effort without tachypnea or retractions. Lungs CTAB with breath sounds noted in all lung fields. Musculoskeletal: Full range of motion of bilateral hips but pain with full flexion and lateral rotation of the left hip. Tenderness to palpation about the lateral, proximal left humerus without bony abnormality or crepitus. Full range of motion of bilateral ankles, feet and toes without difficulty. Pain about the proximal foot with full flexion of the left ankle. No lower extremity edema.  No joint effusions. Neurologic:  Normal speech and language. No gross focal neurologic deficits are appreciated. Strength of bilateral lower extremities is 5 out of 5 and equal. Skin:  Blue ecchymosis is noted about the anterior and lateral left lower leg as well as the lateral, proximal left foot without bony abnormality or step-offs noted. Tenderness to palpation about these areas is noted. Skin is warm, dry and intact. No  rash, redness, abnormal warmth or wounds or bleeding noted. Psychiatric: Mood and affect are normal. Speech and behavior are normal. Patient exhibits appropriate insight and judgement.   ____________________________________________   LABS  None ____________________________________________  EKG  None ____________________________________________  RADIOLOGY I, Hope PigeonJami L Hagler, personally viewed and evaluated these images (plain radiographs) as part of my medical decision making, as well as reviewing the written report by the radiologist.  Dg Tibia/fibula Left  Result Date: 10/12/2016 CLINICAL DATA:  Patient status post fall.  Pain.  Initial encounter. EXAM: LEFT TIBIA AND FIBULA - 2 VIEW COMPARISON:  None. FINDINGS: There is no evidence of fracture or other focal bone lesions. Soft tissues are unremarkable. IMPRESSION: Negative. Electronically Signed   By: Annia Beltrew  Davis M.D.   On: 10/12/2016 13:07   Dg Foot Complete Left  Result Date: 10/12/2016 CLINICAL DATA:  Left foot pain status post fall. EXAM: LEFT FOOT - COMPLETE 3+ VIEW COMPARISON:  None. FINDINGS: There is no evidence of fracture or dislocation. There is no evidence of arthropathy or other focal bone abnormality. Soft tissues are unremarkable. IMPRESSION: Negative. Electronically Signed   By: Elige KoHetal  Patel   On: 10/12/2016 13:09   Dg Foot Complete Right  Result Date: 10/12/2016 CLINICAL DATA:  Pain after fall EXAM: RIGHT FOOT COMPLETE - 3+ VIEW COMPARISON:  None. FINDINGS: Healed distal fibular fracture. No acute fractures or dislocations in the foot. IMPRESSION: No acute abnormalities. Electronically Signed   By: Gerome Samavid  Williams III M.D   On: 10/12/2016 13:09   Dg Hip Unilat With Pelvis 2-3 Views Left  Result Date: 10/12/2016 CLINICAL DATA:  Status post fall, twisting injury of the left foot EXAM: DG HIP (WITH OR WITHOUT PELVIS) 2-3V LEFT COMPARISON:  None. FINDINGS: There is no evidence of hip fracture or dislocation. There is  no evidence of arthropathy or other focal bone abnormality. Posterior lumbar interbody fusion at L5-S1. IMPRESSION: No acute osseous injury of the left hip. Electronically Signed   By: Elige KoHetal  Patel   On: 10/12/2016 13:07    ____________________________________________    PROCEDURES  Procedure(s) performed: None   Procedures   Medications  ketorolac (TORADOL) 30 MG/ML injection 30 mg (30 mg Intramuscular  Given 10/12/16 1338)     ____________________________________________   INITIAL IMPRESSION / ASSESSMENT AND PLAN / ED COURSE  Pertinent labs & imaging results that were available during my care of the patient were reviewed by me and considered in my medical decision making (see chart for details).  Clinical Course     Patient's diagnosis is consistent with Contusion of left hip, thigh, lower leg, foot and right foot pain due to fall. Patient will be discharged home with prescriptions for Ultram to take as needed for severe pain. Patient may continue to use acetaminophen as needed for pain. Patient is to follow up with her primary care provider if symptoms persist past this treatment course. Patient is given ED precautions to return to the ED for any worsening or new symptoms.    ____________________________________________  FINAL CLINICAL IMPRESSION(S) / ED DIAGNOSES  Final diagnoses:  Contusion of left hip and thigh, initial encounter  Contusion of left lower leg, initial encounter  Contusion of left foot, initial encounter  Right foot pain  Fall, initial encounter      NEW MEDICATIONS STARTED DURING THIS VISIT:  New Prescriptions   TRAMADOL (ULTRAM) 50 MG TABLET    Take 1 tablet (50 mg total) by mouth every 6 (six) hours as needed.         Hope Pigeon, PA-C 10/12/16 1423    Phineas Semen, MD 10/12/16 1544

## 2016-10-12 NOTE — ED Notes (Signed)
Pt taken to lobby in w/c. Officer in lobby going to get her car for her.

## 2016-10-12 NOTE — ED Notes (Signed)
Iv's from sept and oct that are no longer in, were d/c from computer.

## 2016-10-12 NOTE — ED Triage Notes (Signed)
States was able to get to car and drive self to ED. Xrays deferred until provider exam.

## 2016-10-14 ENCOUNTER — Telehealth: Payer: Self-pay | Admitting: Pain Medicine

## 2016-10-14 NOTE — Telephone Encounter (Signed)
Patient is already scheduled for an appointment tomorrow.  Informed patient to bring all pill bottles to appointment.

## 2016-10-14 NOTE — Telephone Encounter (Signed)
Patient fell on Saturday, bruised hips, and legs, having hard time walking, can't use crutches. Wants to come in for eval with Dr. Laban EmperorNaveira, has already been to ER for this.

## 2016-10-15 ENCOUNTER — Ambulatory Visit: Payer: Medicare Other | Attending: Pain Medicine | Admitting: Pain Medicine

## 2016-10-15 ENCOUNTER — Encounter: Payer: Self-pay | Admitting: Pain Medicine

## 2016-10-15 VITALS — BP 131/96 | HR 70 | Temp 97.7°F | Resp 18 | Ht 59.5 in | Wt 115.0 lb

## 2016-10-15 DIAGNOSIS — G894 Chronic pain syndrome: Secondary | ICD-10-CM | POA: Diagnosis present

## 2016-10-15 DIAGNOSIS — F17210 Nicotine dependence, cigarettes, uncomplicated: Secondary | ICD-10-CM | POA: Diagnosis not present

## 2016-10-15 DIAGNOSIS — M797 Fibromyalgia: Secondary | ICD-10-CM | POA: Diagnosis not present

## 2016-10-15 DIAGNOSIS — M1288 Other specific arthropathies, not elsewhere classified, other specified site: Secondary | ICD-10-CM | POA: Diagnosis not present

## 2016-10-15 DIAGNOSIS — F329 Major depressive disorder, single episode, unspecified: Secondary | ICD-10-CM | POA: Diagnosis not present

## 2016-10-15 DIAGNOSIS — I252 Old myocardial infarction: Secondary | ICD-10-CM | POA: Diagnosis not present

## 2016-10-15 DIAGNOSIS — Z9181 History of falling: Secondary | ICD-10-CM | POA: Diagnosis not present

## 2016-10-15 DIAGNOSIS — M5412 Radiculopathy, cervical region: Secondary | ICD-10-CM | POA: Insufficient documentation

## 2016-10-15 DIAGNOSIS — M542 Cervicalgia: Secondary | ICD-10-CM | POA: Insufficient documentation

## 2016-10-15 DIAGNOSIS — I10 Essential (primary) hypertension: Secondary | ICD-10-CM | POA: Insufficient documentation

## 2016-10-15 DIAGNOSIS — M79601 Pain in right arm: Secondary | ICD-10-CM | POA: Diagnosis not present

## 2016-10-15 DIAGNOSIS — R5382 Chronic fatigue, unspecified: Secondary | ICD-10-CM | POA: Diagnosis not present

## 2016-10-15 DIAGNOSIS — Z8249 Family history of ischemic heart disease and other diseases of the circulatory system: Secondary | ICD-10-CM | POA: Diagnosis not present

## 2016-10-15 DIAGNOSIS — F411 Generalized anxiety disorder: Secondary | ICD-10-CM | POA: Diagnosis not present

## 2016-10-15 DIAGNOSIS — M25511 Pain in right shoulder: Secondary | ICD-10-CM | POA: Diagnosis not present

## 2016-10-15 DIAGNOSIS — Z87898 Personal history of other specified conditions: Secondary | ICD-10-CM | POA: Diagnosis not present

## 2016-10-15 DIAGNOSIS — Z79891 Long term (current) use of opiate analgesic: Secondary | ICD-10-CM | POA: Insufficient documentation

## 2016-10-15 DIAGNOSIS — M25552 Pain in left hip: Secondary | ICD-10-CM | POA: Insufficient documentation

## 2016-10-15 DIAGNOSIS — E119 Type 2 diabetes mellitus without complications: Secondary | ICD-10-CM | POA: Insufficient documentation

## 2016-10-15 DIAGNOSIS — I2510 Atherosclerotic heart disease of native coronary artery without angina pectoris: Secondary | ICD-10-CM | POA: Diagnosis not present

## 2016-10-15 DIAGNOSIS — G8929 Other chronic pain: Secondary | ICD-10-CM | POA: Diagnosis not present

## 2016-10-15 DIAGNOSIS — M79672 Pain in left foot: Secondary | ICD-10-CM | POA: Diagnosis not present

## 2016-10-15 DIAGNOSIS — N8110 Cystocele, unspecified: Secondary | ICD-10-CM | POA: Insufficient documentation

## 2016-10-15 DIAGNOSIS — R42 Dizziness and giddiness: Secondary | ICD-10-CM | POA: Insufficient documentation

## 2016-10-15 DIAGNOSIS — M25512 Pain in left shoulder: Secondary | ICD-10-CM | POA: Diagnosis not present

## 2016-10-15 DIAGNOSIS — M47812 Spondylosis without myelopathy or radiculopathy, cervical region: Secondary | ICD-10-CM

## 2016-10-15 DIAGNOSIS — J449 Chronic obstructive pulmonary disease, unspecified: Secondary | ICD-10-CM | POA: Diagnosis not present

## 2016-10-15 DIAGNOSIS — E559 Vitamin D deficiency, unspecified: Secondary | ICD-10-CM | POA: Insufficient documentation

## 2016-10-15 DIAGNOSIS — E7800 Pure hypercholesterolemia, unspecified: Secondary | ICD-10-CM | POA: Insufficient documentation

## 2016-10-15 MED ORDER — OXYCODONE HCL 5 MG PO TABS: 5.0000 mg | ORAL_TABLET | Freq: Four times a day (QID) | ORAL | 0 refills | Status: DC | PRN

## 2016-10-15 MED ORDER — OXYCODONE HCL 5 MG PO TABS: 5.0000 mg | ORAL_TABLET | Freq: Every day | ORAL | 0 refills | Status: DC

## 2016-10-15 MED ORDER — OXYCODONE HCL 5 MG PO TABS: 5.0000 mg | ORAL_TABLET | Freq: Three times a day (TID) | ORAL | 0 refills | Status: DC

## 2016-10-15 MED ORDER — OXYCODONE HCL 5 MG PO TABS: 5.0000 mg | ORAL_TABLET | Freq: Four times a day (QID) | ORAL | 0 refills | Status: DC

## 2016-10-15 MED ORDER — OXYCODONE HCL 5 MG PO TABS: 5.0000 mg | ORAL_TABLET | Freq: Two times a day (BID) | ORAL | 0 refills | Status: DC

## 2016-10-15 MED ORDER — CYCLOBENZAPRINE HCL 10 MG PO TABS: 10.0000 mg | ORAL_TABLET | Freq: Every day | ORAL | 0 refills | Status: DC
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
## 2016-10-15 NOTE — Progress Notes (Signed)
Patient's Name: Michelle Newton  MRN: [id number]  Referring Provider: Lynnell Jude, MD  DOB: 1961-09-26  PCP: Lynnell Jude, MD  DOS: 10/15/2016  Note by: Kathlen Brunswick. Dossie Arbour, MD  Service setting: Ambulatory outpatient  Specialty: Interventional Pain Management  Location: [facility] (AMB) Pain Management Facility    Patient type: Established   Primary Reason(s) for Visit: Encounter for prescription drug management (Level of risk: moderate) CC: Shoulder Pain (bilateral); Hip Pain (left); and Foot Pain (bilateral)  HPI  Michelle Newton is a 55 y.o. year old, female patient, who comes today for a medication management evaluation. She has Encounter for therapeutic drug level monitoring; Encounter for long-term use of opiate analgesic; Opiate use (37.5 MME/Day); Cervical facet syndrome (L); Cervical spondylosis; Chronic neck pain (Location of Secondary source of pain) (Bilateral) (L>R); Chronic hip pain (Left); Lumbar facet syndrome; Lumbar spondylosis; Failed back surgical syndrome; Chronic low back pain; Fibromyalgia; Chronic pain syndrome; Bladder cystocele; H/O acute myocardial infarction; Right shoulder: S/P Rotator cuff repair; Chronic thumb pain (Bilateral); Upper extremity pain; Occipital neuralgia (Bilateral); Smoker; Generalized anxiety disorder; Depression; Wrist arthritis; Fracture five ribs-closed; History of seizures; History of migraine; H/O non-insulin dependent diabetes mellitus; Essential hypertension; High cholesterol; Coronary artery disease; History of cardiac arrhythmia; H/O vertigo; Vitamin D insufficiency; Chronic cervical radicular pain (Left); Chronic fatigue syndrome; Family history of chronic pain; History of knee surgery; History of elbow surgery; Long term current use of opiate analgesic; Long term prescription opiate use; Opioid dependence, daily use ([place]); Right shoulder:  Rotator cuff Disorder; Abnormal MRI, shoulder (Right); Right shoulder: Adhesive capsulitis; Right shoulder: Torn &  retracted Infraspinatus tendon; Right shoulder: Torn & retracted Supraspinatus tendon (Complete, posterior full-thickness tear); Right shoulder: Subscapularis tendinopathy; Right shoulder: Biceps tendinopathy; 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 Her primarily concern today is the Shoulder Pain (bilateral); Hip Pain (left); and Foot Pain (bilateral)  Pain Assessment: Self-Reported Pain Score: 8 /10 Clinically the patient looks like a 2/10 Clear symptom exaggeration. Reported level of pain is not compatible with clinical observations.       Pain Descriptors / Indicators: Aching, Constant, Throbbing, Stabbing Pain Frequency: Constant  Michelle Newton was last evaluated on 09/23/2016 for medication management. On 09/23/2016 I gave her a prescription for oxycodone IR to last until 10/27/2016. During today's appointment we reviewed Michelle Newton's chronic pain status, as well as her outpatient medication regimen. She returns today indicating then on 10/12/2016 and she fell and hit her ankle and hip. During today's appointment and she indicated to me that she fell because she tripped on her cats. However in reading than note from the ED, it states that she was out putting decorations. While in the ED, she got a prescription for tramadol. The patient is well aware, regulations and how she is supposed to give [place] a call and immediately inform us of any events that might have occurred, leading to more opioids being given to her. The fall occurred on 10/12/2016 but it was not until 10/14/2016 at 10:01 AM that we received a phone call from her indicating that she wanted to be  evaluated. However, she neglected to inform us that she had been given additional pain medication. In fact, my nurse called her and reminded  her that she needed to bring her medications to the appointment, which she did not. The patient has been inform several times that she needs to bring those medications in to be checked and counted and therefore she is in noncompliance with our rules and regulations.   The patient comes in today clinics today demanding to be switched from her oxycodone/APAP 5/325, back to her Tylenol No. 3. I have informed her several times that I will not be prescribing codeine for chronic pain. It is a prodrug that requires the liver to activate it by converting it into morphine. In addition, I really do not want her to be taking APAP all of the time. 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 I have allowed her to dictate her own care, but at this point I am no longer willing to do that since I feel that if I allow her to, she will end up either developing kidney and/or liver problems, or worse, dying of an overdose. I have identified several nefarious trends. She has difficulty keeping her stories straight and whenever she is confronted about it, or caught being mendacious, she will quickly change the topic. An example is the story she told the ED physician about how she fell when putting up decorations, vs the one that she told us about having tripped over her cats. In addition, she claims that 5 oxycodone per day is too much. However, I have identified a cyclical trend where about 1 week before it is time for her to refill her medications, she has some kind of event that lands her in the ED, or claims to have problems with her medications and needs to try something different. Because she never returns her other medication, or claims to have flushed it, she ends up getting an early refill. At this point, it is my impression that she is using  more medication than prescribed and she is running out early. In addition, she clearly tries to manipulate her therapy by making claims that cannot be medically explained. For example, she claims that she cannot take oxycodone without tylenol because when she does, she develops urinary retention and has to be catheterized for long periods of time. However, she can take Percocet (oxycodone/APAP). When she said this, I again explained to her what the difference between oxycodone/APAP and oxycodone IR was. I asked her if she realized what she was saying?Marland Kitchen... Basically that if oxycodone IR cause urinary retention and oxycodone/APAP did not, she was in fact stating that APAP (Tylenol) was actually allowing her to void. (???) In addition, she is also conveniently "allergic" or intolerant to non-opioid treatment options such as NSAID, steroids, Lyrica, Neurontin, etc.. In any case, today I informed her that I would not be switching her back to the Tylenol #3 and I would not be giving her an early refill. This triggered the patient to raise her voice at me and become agitated. At this point, I suggested calming down and pointed out that perhaps she was not satisfied with my care and if that was the case, then I could arrange for a referral elsewhere. She went on and on about how she was still having neck pain and how I had raised my voice at her. I calmly asked her to tell me when this had happened and she corrected herself and said that I had not raised my voice at her, but that I had been rude to her because I  hung up the phone when she called me. I do admit having ended a weekend phone conversation short, after she had gone on for 45 minutes trying to convince me to get her an early medication refill, while I was engaged in an unrelated emergency. At the time I said: "I'm sorry but the answer is no, and I have another emergency to attend to, and if you do not have anything else new to tell me, I need to go." and then I  ended the call. In any case, today's visit has made it clear to me that she is not in agreement with my plans to keep her safe, so, at this point I personally informed her that it was my impression that our physician-patient relationship had deteriorated beyond repair. Therefore, I will be tapering her off of all narcotics and I will request a referral to another pain specialist. I feel that with Michelle Newton I have lost control over her therapy. This makes me very nervous because she has been dismissing all of my safety warnings. I personally handed her the physician-patient termination letter, which she tore to pieces in front of the nursing staff. She was informed that I will be available for emergencies only, for a period of 30 days, after which, I will no longer be available to her.   The patient  reports that she does not use drugs. Her body mass index is 22.84 kg/m.  Further details on both, my assessment(s), as well as the proposed treatment plan, please see below.  Controlled Substance Pharmacotherapy Assessment REMS (Risk Evaluation and Mitigation Strategy)  Analgesic:Oxycodone/APAP 5/325 one tablet 5 times a day (25 mg/day) MME/day:37.5 mg/day.  Angelique Holm, RN  10/15/2016 10:56 AM  Sign at close encounter Nursing Pain Medication Assessment:  Safety precautions to be maintained throughout the outpatient stay will include: orient to surroundings, keep bed in low position, maintain call bell within reach at all times, provide assistance with transfer out of bed and ambulation.  Medication Inspection Compliance: Michelle Newton did not comply with our request to bring her pills to be counted. She was reminded that bringing the medication bottles, even when empty, is a requirement. Pill Count: No pills available to be counted today. Bottle Appearance: No container available. Did not bring bottle(s) to appointment, despite the fact that [facility] called her and reminded her to bring all of her  pills and bottles. Medication: None brought in. Filled Date: N/A Medication last intake 0700 this AM. Took Percocet 5/325    Pharmacokinetics: Liberation and absorption (onset of action): WNL Distribution (time to peak effect): WNL Metabolism and excretion (duration of action): WNL         Pharmacodynamics: Desired effects: Analgesia: The patient reports >50% benefit. Reported improvement in function: The patient reports medication allows her to accomplish basic ADLs. Clinically meaningful improvement in function (CMIF): Sustained CMIF goals met Perceived effectiveness: Described as relatively effective, allowing for increase in activities of daily living (ADL) Undesirable effects: Side-effects or Adverse reactions: None reported Monitoring: [place] PMP: Online review of the past 54-monthperiod conducted. Non-compliant. The patient has been inform several times of the need to come off of the distal bypass of things, but she continues to use him on a regular basis. In addition, she continues to obtain pain medications from other sources. During the medication reconciliation portion of the visit, the patient denied using tramadol, which we have confirmed was written for her her visit to the ED. List of all UDS test(s)  done:  Lab Results  Component Value Date   TOXASSSELUR FINAL 08/19/2016   TOXASSSELUR FINAL 06/05/2016   TOXASSSELUR FINAL 03/19/2016   TOXASSSELUR FINAL 12/20/2015   TOXASSSELUR FINAL 11/13/2015   Last UDS on record: ToxAssure Select 13  Date Value Ref Range Status  08/19/2016 FINAL  Final    Comment:    ==================================================================== TOXASSURE SELECT 13 (MW) ==================================================================== Test                             Result       Flag       Units Drug Present and Declared for Prescription Verification   Desmethyldiazepam              1631         EXPECTED   ng/mg creat   Oxazepam                        1323         EXPECTED   ng/mg creat   Temazepam                      1080         EXPECTED   ng/mg creat    Desmethyldiazepam, oxazepam, and temazepam are expected    metabolites of diazepam. Desmethyldiazepam and oxazepam are also    expected metabolites of other drugs, including chlordiazepoxide,    prazepam, clorazepate, and halazepam. Oxazepam is an expected    metabolite of temazepam. Oxazepam and temazepam are also    available as scheduled prescription medications.   Oxycodone                      434          EXPECTED   ng/mg creat   Oxymorphone                    1869         EXPECTED   ng/mg creat   Noroxycodone                   3729         EXPECTED   ng/mg creat   Noroxymorphone                 223          EXPECTED   ng/mg creat    Sources of oxycodone are scheduled prescription medications.    Oxymorphone, noroxycodone, and noroxymorphone are expected    metabolites of oxycodone. Oxymorphone is also available as a    scheduled prescription medication.   Butalbital                     PRESENT      EXPECTED ==================================================================== Test                      Result    Flag   Units      Ref Range   Creatinine              35               mg/dL      >=20 ==================================================================== Declared Medications:  The flagging and interpretation on this report are based on the  following declared medications.  Unexpected results may arise from  inaccuracies in  the declared medications.  **Note: The testing scope of this panel includes these medications:  Butalbital (Butalbital/APAP/Caffeine)  Diazepam (Valium)  Oxycodone (Oxycodone Acetaminophen)  **Note: The testing scope of this panel does not include following  reported medications:  Acetaminophen (Butalbital/APAP/Caffeine)  Acetaminophen (Oxycodone Acetaminophen)  Albuterol  Albuterol (Combivent)  Aspirin  Benzonatate (Tessalon)   Bisoprolol (Ziac)  Caffeine (Butalbital/APAP/Caffeine)  Calcium Carbonate (Calcium Carb/Vitamin D)  Cyclobenzaprine (Flexeril)  Dicyclomine (Bentyl)  Estradiol (Climara)  Fluticasone (Advair)  Hydrochlorothiazide  Hydrochlorothiazide (Ziac)  Ipratropium (Combivent)  Magnesium  Metoprolol (Lopressor)  Nitroglycerin  Paroxetine (Paxil)  Potassium  Promethazine  Rosuvastatin (Crestor)  Salmeterol (Advair)  Supplement  Vitamin B12  Vitamin D (Calcium Carb/Vitamin D)  Vitamin E ==================================================================== For clinical consultation, please call [phone]. ====================================================================    UDS interpretation: Non-Compliant Patient informed of the CDC guidelines and recommendations to stay away from the concomitant use of benzodiazepines and opioids due to the increased risk of respiratory depression and death. Medication Assessment Form: Discrepancies found between patient's report and information collected Treatment compliance: Non-compliant Risk Assessment Profile: Aberrant behavior: imparied control over use of medications, prescription drug abuse, prescription drug misuse, non-compliance with medical instructions on the proper use of the medication, unsanctioned dose escalation, unsafe use of medication, diminised ability to recongnize a problem with one's behavior or  use of the medication, dysfunctional emotional responses, continued use despite claims of ineffective analgesia, non-compliance with practice rules and regulations, inability to consider abstinence, obtaining controlled sustances from inappropriate sources, drug seeking behavior, doctor shopping, taking more medication than prescribe, claims that "nothing else works", request for speciifc drugs or requesting "Brand Name" durgs, missing appointments for alternative therapies, but not those for medication refills, appearance of intoxication or  being "High", extensive time discussing medication , repeated negotiations to obtain more medication, frequent involvement in accidents, urgent visits for pain and request for early refills Comorbid factors increasing risk of overdose: Concomitant use of Benzodiazepines, History of multiple prescribers, Age 57-56 years old, Caucasian, Multiple prescriptions, Multiple prescribers and COPD or asthma Risk of substance use disorder (SUD): Very High Opioid Risk Tool (ORT) Total Score:  4-5  Interpretation Table:  Score <3 = Low Risk for SUD  Score between 4-7 = Moderate Risk for SUD  Score >8 = High Risk for Opioid Abuse   Risk Mitigation Strategies:  Patient Counseling: Covered. Time after time I have gone over the proper and safe use of the medications.  Patient-Prescriber Agreement (PPA): Medication agreement broken by patient.  Notification to other healthcare providers: Done  Pharmacologic Plan: Opioid therapy discontinued. Today I have provided the patient with a medication taper to come off of her narcotics.  Laboratory Chemistry  Inflammation Markers Lab Results  Component Value Date   ESRSEDRATE 13 06/06/2016   CRP 1.1 (H) 06/06/2016   Renal Function Lab Results  Component Value Date   BUN 12 06/06/2016   CREATININE 0.85 06/06/2016   GFRAA >60 06/06/2016   GFRNONAA >60 06/06/2016   Hepatic Function Lab Results  Component Value Date   AST 21 06/06/2016   ALT 17 06/06/2016   ALBUMIN 3.7 06/06/2016   Electrolytes Lab Results  Component Value Date   NA 142 06/06/2016   K 3.3 (L) 06/06/2016   CL 106 06/06/2016   CALCIUM 8.8 (L) 06/06/2016   MG 1.7 06/06/2016   Pain Modulating Vitamins Lab Results  Component Value Date   25OHVITD1 39 06/06/2016   25OHVITD2 2.2 06/06/2016   25OHVITD3 37 06/06/2016   VITAMINB12  593 06/06/2016   Coagulation Parameters Lab Results  Component Value Date   PLT 309 12/22/2015   Cardiovascular Lab Results  Component Value Date    HGB 14.8 12/22/2015   HCT 44.2 12/22/2015   Note: Lab results reviewed.  Recent Diagnostic Imaging Review  Dg Tibia/fibula Left Result Date: 10/12/2016 CLINICAL DATA:  Patient status post fall.  Pain.  Initial encounter. EXAM: LEFT TIBIA AND FIBULA - 2 VIEW COMPARISON:  None. FINDINGS: There is no evidence of fracture or other focal bone lesions. Soft tissues are unremarkable. IMPRESSION: Negative. Electronically Signed   By: Lovey Newcomer M.D.   On: 10/12/2016 13:07   Dg Foot Complete Left Result Date: 10/12/2016 CLINICAL DATA:  Left foot pain status post fall. EXAM: LEFT FOOT - COMPLETE 3+ VIEW COMPARISON:  None. FINDINGS: There is no evidence of fracture or dislocation. There is no evidence of arthropathy or other focal bone abnormality. Soft tissues are unremarkable. IMPRESSION: Negative. Electronically Signed   By: Kathreen Devoid   On: 10/12/2016 13:09   Dg Foot Complete Right Result Date: 10/12/2016 CLINICAL DATA:  Pain after fall EXAM: RIGHT FOOT COMPLETE - 3+ VIEW COMPARISON:  None. FINDINGS: Healed distal fibular fracture. No acute fractures or dislocations in the foot. IMPRESSION: No acute abnormalities. Electronically Signed   By: Dorise Bullion III M.D   On: 10/12/2016 13:09   Dg Hip Unilat With Pelvis 2-3 Views Left Result Date: 10/12/2016 CLINICAL DATA:  Status post fall, twisting injury of the left foot EXAM: DG HIP (WITH OR WITHOUT PELVIS) 2-3V LEFT COMPARISON:  None. FINDINGS: There is no evidence of hip fracture or dislocation. There is no evidence of arthropathy or other focal bone abnormality. Posterior lumbar interbody fusion at L5-S1. IMPRESSION: No acute osseous injury of the left hip. Electronically Signed   By: Kathreen Devoid   On: 10/12/2016 13:07   Note: Imaging results reviewed.  Meds  The patient has a current medication list which includes the following prescription(s): magnesium oxide, advair diskus, albuterol, albuterol-ipratropium, aspirin ec,  bisoprolol-hydrochlorothiazide, butalbital-acetaminophen-caffeine, calcium carbonate-vit d-min, vitamin d, cinnamon, combivent respimat, cyanocobalamin, cyclobenzaprine, diazepam, dicyclomine, estradiol, fluticasone-salmeterol, hydrochlorothiazide, magnesium, metoprolol tartrate, nitroglycerin, oxycodone, oxycodone, oxycodone, oxycodone, paroxetine, potassium, potassium chloride, promethazine, promethazine-dextromethorphan, rosuvastatin, and vitamin e.  Current Outpatient Prescriptions on File Prior to Visit  Medication Sig  . ADVAIR DISKUS 500-50 MCG/DOSE AEPB   . albuterol (PROVENTIL) (2.5 MG/3ML) 0.083% nebulizer solution as needed.   Marland Kitchen albuterol-ipratropium (COMBIVENT) 18-103 MCG/ACT inhaler Inhale 2 puffs into the lungs every 6 (six) hours as needed for wheezing or shortness of breath.  Marland Kitchen aspirin EC 81 MG tablet Take 81 mg by mouth daily.  . bisoprolol-hydrochlorothiazide (ZIAC) 10-6.25 MG tablet Take 1 tablet by mouth daily.  . butalbital-acetaminophen-caffeine (FIORICET, ESGIC) 50-325-40 MG tablet Take 1 tablet by mouth every 4 (four) hours as needed for headache.  . Calcium Carbonate-Vit D-Min (CALCIUM 1200 PO) Take by mouth daily.  . Cholecalciferol (VITAMIN D) 2000 UNITS tablet Take 1 tablet (2,000 Units total) by mouth daily.  Marland Kitchen Cinnamon ([employer] CINNAMON) 500 MG capsule Take 500 mg by mouth daily.  . COMBIVENT RESPIMAT 20-100 MCG/ACT AERS respimat 3 (three) times daily as needed.   . cyanocobalamin (,VITAMIN B-12,) 1000 MCG/ML injection Every 3 weeks  . diazepam (VALIUM) 5 MG tablet Take 2.5 mg by mouth every 8 (eight) hours as needed for anxiety (takes 1/2 - 1 tablet as needed for anxiety).   Marland Kitchen dicyclomine (BENTYL) 10 MG capsule take 1 capsule by mouth  four times a day if needed for ABDOMINAL SPASM  . estradiol (CLIMARA - DOSED IN MG/24 HR) 0.1 mg/24hr patch Place 0.1 mg onto the skin once a week. On Wednesday  . Fluticasone-Salmeterol (ADVAIR) 100-50 MCG/DOSE AEPB Inhale 1 puff into the  lungs 2 (two) times daily.  . hydrochlorothiazide (HYDRODIURIL) 12.5 MG tablet take 1 tablet by mouth once daily if needed for edema  . Magnesium 400 MG TABS Take 1 tablet by mouth 2 (two) times daily.   . metoprolol tartrate (LOPRESSOR) 25 MG tablet 25 mg 2 (two) times daily.   . nitroGLYCERIN (NITROSTAT) 0.4 MG SL tablet Place 0.4 mg under the tongue every 5 (five) minutes as needed for chest pain.  Marland Kitchen PARoxetine (PAXIL) 30 MG tablet Take 30 mg by mouth daily.  . Potassium 95 MG TABS Take 1 tablet by mouth daily.  . promethazine (PHENERGAN) 25 MG tablet Take 25 mg by mouth every 6 (six) hours as needed for nausea or vomiting.  . promethazine-dextromethorphan (PROMETHAZINE-DM) 6.25-15 MG/5ML syrup take 5 milliliters by mouth every 4 hours if needed for cough  . rosuvastatin (CRESTOR) 20 MG tablet Take 20 mg by mouth at bedtime.   . vitamin E 1000 UNIT capsule Take 1,000 Units by mouth daily.   No current facility-administered medications on file prior to visit.    ROS  Constitutional: Denies any fever or chills Gastrointestinal: No reported hemesis, hematochezia, vomiting, or acute GI distress Musculoskeletal: Denies any acute onset joint swelling, redness, loss of ROM, or weakness Neurological: No reported episodes of acute onset apraxia, aphasia, dysarthria, agnosia, amnesia, paralysis, loss of coordination, or loss of consciousness  Allergies  Michelle Newton is allergic to cymbalta [duloxetine hcl]; duloxetine; flagyl [metronidazole]; lyrica [pregabalin]; vicodin [hydrocodone-acetaminophen]; nsaids; and tape.  [place]  Drug: Michelle Newton  reports that she does not use drugs. Alcohol:  reports that she does not drink alcohol. Tobacco:  reports that she has been smoking Cigarettes.  She has been smoking about 0.25 packs per day. She has never used smokeless tobacco. Medical:  has a past medical history of Anxiety; Asthma; CAD (coronary artery disease); Chronic fatigue; Chronic thumb pain,  bilateral (09/12/2015); COPD (chronic obstructive pulmonary disease) ([place]); Coronary artery disease; Depression; DJD (degenerative joint disease); DJD (degenerative joint disease); Essential hypertension (09/12/2015); Family history of chronic pain (09/26/2015); Fibromyalgia; Fibromyalgia; Fracture five ribs-closed (09/12/2015); H/O acute myocardial infarction (12/14/2014); H/O non-insulin dependent diabetes mellitus (09/12/2015); History of cardiac arrhythmia (09/12/2015); History of elbow surgery (09/26/2015); History of knee surgery (09/26/2015); History of migraine (09/12/2015); History of seizures (09/12/2015); Hypercholesteremia; MI (myocardial infarction); Migraine; Migraine; Osteoarthritis; Right shoulder: Acute Pain due to trauma (04/18/2016); Right shoulder: Chronic Pain (09/12/2015); Seizures ([place]); 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 Family: family history includes Breast cancer in her cousin and maternal aunt.  Past Surgical History:  Procedure Laterality Date  . ABDOMINAL HYSTERECTOMY    . KNEE ARTHROPLASTY Left   . left elbow surgery    . LUMBAR DISC SURGERY    . ROTATOR CUFF REPAIR Right   . TRIGGER FINGER RELEASE Left   . ureter mesh     Constitutional Exam  General appearance: Well nourished, well developed, and well hydrated. In no apparent acute distress Vitals:   10/15/16 1104  BP: (!) 131/96  Pulse: 70  Resp: 18  Temp: 97.7 F (36.5 C)  TempSrc: Oral  SpO2: 99%  Weight: 115 lb (52.2 kg)  Height: 4' 11.5" (1.511 m)   BMI Assessment: Estimated body mass index is  22.84 kg/m as calculated from the following:   Height as of this encounter: 4' 11.5" (1.511 m).   Weight as of this encounter: 115 lb (52.2 kg).  BMI interpretation table: BMI level Category Range association with higher incidence of chronic pain  <18 kg/m2 Underweight   18.5-24.9 kg/m2 Ideal body weight   25-29.9 kg/m2 Overweight Increased incidence by 20%  30-34.9 kg/m2 Obese (Class  I) Increased incidence by 68%  35-39.9 kg/m2 Severe obesity (Class II) Increased incidence by 136%  >40 kg/m2 Extreme obesity (Class III) Increased incidence by 254%   BMI Readings from Last 4 Encounters:  10/15/16 22.84 kg/m  10/12/16 23.23 kg/m  09/23/16 22.84 kg/m  09/17/16 23.23 kg/m   Wt Readings from Last 4 Encounters:  10/15/16 115 lb (52.2 kg)  10/12/16 115 lb (52.2 kg)  09/23/16 115 lb (52.2 kg)  09/17/16 115 lb (52.2 kg)  Psych/Mental status: Alert, oriented x 3 (person, place, & time) Eyes: PERLA Respiratory: No evidence of acute respiratory distress  Cervical Spine Exam  Inspection: No masses, redness, or swelling Alignment: Symmetrical Functional ROM: Unrestricted ROM Stability: No instability detected Muscle strength & Tone: Functionally intact Sensory: Unimpaired Palpation: Non-contributory  Upper Extremity (UE) Exam    Side: Right upper extremity  Side: Left upper extremity  Inspection: No masses, redness, swelling, or asymmetry  Inspection: No masses, redness, swelling, or asymmetry  Functional ROM: Unrestricted ROM         Functional ROM: Unrestricted ROM          Muscle strength & Tone: Functionally intact  Muscle strength & Tone: Functionally intact  Sensory: Unimpaired  Sensory: Unimpaired  Palpation: Non-contributory  Palpation: Non-contributory   Thoracic Spine Exam  Inspection: No masses, redness, or swelling Alignment: Symmetrical Functional ROM: Unrestricted ROM Stability: No instability detected Sensory: Unimpaired Muscle strength & Tone: Functionally intact Palpation: Non-contributory  Lumbar Spine Exam  Inspection: No masses, redness, or swelling Alignment: Symmetrical Functional ROM: Unrestricted ROM Stability: No instability detected Muscle strength & Tone: Functionally intact Sensory: Unimpaired Palpation: Non-contributory Provocative Tests: Lumbar Hyperextension and rotation test: evaluation deferred today       Patrick's  Maneuver: evaluation deferred today              Gait & Posture Assessment  Ambulation: Unassisted Gait: Relatively normal for age and body habitus Posture: WNL   Lower Extremity Exam    Side: Right lower extremity  Side: Left lower extremity  Inspection: No masses, redness, swelling, or asymmetry  Inspection: No masses, redness, swelling, or asymmetry  Functional ROM: Unrestricted ROM          Functional ROM: Unrestricted ROM          Muscle strength & Tone: Functionally intact  Muscle strength & Tone: Functionally intact  Sensory: Unimpaired  Sensory: Unimpaired  Palpation: Non-contributory  Palpation: Non-contributory   Assessment  Primary Diagnosis & Pertinent Problem List: The primary encounter diagnosis was Chronic pain syndrome. Diagnoses of At high risk for falls, Chronic pain of shoulders (Location of Primary Source of Pain) (Bilateral) ((R>L), Cervical facet syndrome, Fibromyalgia, and H/O vertigo were also pertinent to this visit.  Visit Diagnosis: 1. Chronic pain syndrome   2. At high risk for falls   3. Chronic pain of shoulders (Location of Primary Source of Pain) (Bilateral) ((R>L)   4. Cervical facet syndrome   5. Fibromyalgia   6. H/O vertigo    Plan of Care  Pharmacotherapy (Medications Ordered): Meds ordered this encounter  Medications  .  cyclobenzaprine (FLEXERIL) 10 MG tablet    Sig: Take 1 tablet (10 mg total) by mouth at bedtime.    Dispense:  30 tablet    Refill:  0    Do not place this medication, or any other prescription from our practice, on "Automatic Refill". Patient may have prescription filled one day early if pharmacy is closed on scheduled refill date.  Marland Kitchen oxyCODONE (OXY IR/ROXICODONE) 5 MG immediate release tablet    Sig: Take 1 tablet (5 mg total) by mouth 3 (three) times daily. Max: 3/day    Dispense:  21 tablet    Refill:  0    Patient may have prescription filled one day early if pharmacy is closed on scheduled refill date. Do not fill  until: 11/03/16 To last until: 11/10/16  . oxyCODONE (OXY IR/ROXICODONE) 5 MG immediate release tablet    Sig: Take 1 tablet (5 mg total) by mouth 2 (two) times daily. Max: 2/day    Dispense:  14 tablet    Refill:  0    Patient may have prescription filled one day early if pharmacy is closed on scheduled refill date. Do not fill until: 11/10/16 To last until: 11/17/16  . oxyCODONE (OXY IR/ROXICODONE) 5 MG immediate release tablet    Sig: Take 1 tablet (5 mg total) by mouth daily. Max: 1/day    Dispense:  7 tablet    Refill:  0    Patient may have prescription filled one day early if pharmacy is closed on scheduled refill date. Do not fill until: 11/17/16 To last until: 11/24/16  . oxyCODONE (OXY IR/ROXICODONE) 5 MG immediate release tablet    Sig: Take 1 tablet (5 mg total) by mouth 4 (four) times daily. Max: 4/day    Dispense:  28 tablet    Refill:  0    Patient may have prescription filled one day early if pharmacy is closed on scheduled refill date. Do not fill until: 10/27/17 To last until: 11/03/17   New Prescriptions   OXYCODONE (OXY IR/ROXICODONE) 5 MG IMMEDIATE RELEASE TABLET    Take 1 tablet (5 mg total) by mouth 3 (three) times daily. Max: 3/day   OXYCODONE (OXY IR/ROXICODONE) 5 MG IMMEDIATE RELEASE TABLET    Take 1 tablet (5 mg total) by mouth 2 (two) times daily. Max: 2/day   OXYCODONE (OXY IR/ROXICODONE) 5 MG IMMEDIATE RELEASE TABLET    Take 1 tablet (5 mg total) by mouth daily. Max: 1/day   OXYCODONE (OXY IR/ROXICODONE) 5 MG IMMEDIATE RELEASE TABLET    Take 1 tablet (5 mg total) by mouth 4 (four) times daily. Max: 4/day   Medications administered today: Michelle Newton had no medications administered during this visit. Lab-work, procedure(s), and/or referral(s): Orders Placed This Encounter  Procedures  . Ambulatory referral to Pain Clinic   Imaging and/or referral(s): AMB REFERRAL TO PAIN CLINIC  Interventional therapies: Planned, scheduled, and/or pending:    Canceled    Considering:   Canceled    Palliative PRN treatment(s):   Canceled    Provider-requested follow-up: No Follow-up on file.  No future appointments. Primary Care Physician: Lynnell Jude, MD Location: [facility] Note by: Kathlen Brunswick. Dossie Arbour, M.D, DABA, DABAPM, DABPM, DABIPP, FIPP  Pain Score Disclaimer: We use the NRS-11 scale. This is a self-reported, subjective measurement of pain severity with only modest accuracy. It is used primarily to identify changes within a particular patient. It must be understood that outpatient pain scales are significantly less accurate that  those used for research, where they can be applied under ideal controlled circumstances with minimal exposure to variables. In reality, the score is likely to be a combination of pain intensity and pain affect, where pain affect describes the degree of emotional arousal or changes in action readiness caused by the sensory experience of pain. Factors such as social and work situation, setting, emotional state, anxiety levels, expectation, and prior pain experience may influence pain perception and show large inter-individual differences that may also be affected by time variables.  Patient instructions provided during this appointment: There are no Patient Instructions on file for this visit.

## 2016-10-15 NOTE — Progress Notes (Signed)
Upon patient's discharge for today's visit, she was given letter written by Dr. Laban EmperorNaveira stating she will no longer be a patient. She wadded the paper up and said "I don't need this."

## 2016-10-15 NOTE — Progress Notes (Signed)
Nursing Pain Medication Assessment:  Safety precautions to be maintained throughout the outpatient stay will include: orient to surroundings, keep bed in low position, maintain call bell within reach at all times, provide assistance with transfer out of bed and ambulation.  Medication Inspection Compliance: Ms. Michelle Newton did not comply with our request to bring her pills to be counted. She was reminded that bringing the medication bottles, even when empty, is a requirement. Pill Count: No pills available to be counted today. Bottle Appearance: No container available. Did not bring bottle(s) to appointment. Medication: None brought in. Filled Date: N/A Medication last intake 0700 this AM. Took Percocet 5/325

## 2016-10-28 ENCOUNTER — Telehealth: Payer: Self-pay

## 2016-10-28 NOTE — Telephone Encounter (Signed)
script dated for 2018 instead of 2017. Clarified with pharmacy.

## 2016-10-28 NOTE — Telephone Encounter (Signed)
Pharmacy called and said that the patient brought in a script for oxycodone but the fill date is 10-27-17. Can you call and give them the  [facility] to [facility] this? He said it must be an error on the date.

## 2016-10-30 ENCOUNTER — Ambulatory Visit: Payer: Medicare Other | Admitting: Pain Medicine

## 2016-11-27 ENCOUNTER — Other Ambulatory Visit: Payer: Self-pay | Admitting: Family Medicine

## 2016-11-27 DIAGNOSIS — M1612 Unilateral primary osteoarthritis, left hip: Secondary | ICD-10-CM

## 2016-11-27 DIAGNOSIS — W19XXXA Unspecified fall, initial encounter: Secondary | ICD-10-CM

## 2016-12-11 ENCOUNTER — Emergency Department: Admission: EM | Admit: 2016-12-11 | Payer: Medicare Other | Source: Home / Self Care

## 2016-12-12 ENCOUNTER — Emergency Department: Payer: Medicare Other

## 2016-12-12 ENCOUNTER — Emergency Department
Admission: EM | Admit: 2016-12-12 | Discharge: 2016-12-12 | Disposition: A | Discharge status: Home / Self Care | Payer: Medicare Other | Attending: Emergency Medicine | Admitting: Emergency Medicine

## 2016-12-12 DIAGNOSIS — S79922D Unspecified injury of left thigh, subsequent encounter: Secondary | ICD-10-CM | POA: Diagnosis present

## 2016-12-12 DIAGNOSIS — J45909 Unspecified asthma, uncomplicated: Secondary | ICD-10-CM | POA: Insufficient documentation

## 2016-12-12 DIAGNOSIS — J449 Chronic obstructive pulmonary disease, unspecified: Secondary | ICD-10-CM | POA: Diagnosis not present

## 2016-12-12 DIAGNOSIS — F17210 Nicotine dependence, cigarettes, uncomplicated: Secondary | ICD-10-CM | POA: Diagnosis not present

## 2016-12-12 DIAGNOSIS — S76212D Strain of adductor muscle, fascia and tendon of left thigh, subsequent encounter: Secondary | ICD-10-CM

## 2016-12-12 DIAGNOSIS — S76912D Strain of unspecified muscles, fascia and tendons at thigh level, left thigh, subsequent encounter: Secondary | ICD-10-CM | POA: Diagnosis not present

## 2016-12-12 DIAGNOSIS — W19XXXD Unspecified fall, subsequent encounter: Secondary | ICD-10-CM | POA: Diagnosis not present

## 2016-12-12 MED ORDER — LIDOCAINE 5 % EX PTCH: 1.0000 | MEDICATED_PATCH | CUTANEOUS | 0 refills | Status: AC

## 2016-12-12 NOTE — ED Notes (Signed)
See triage note   States she fell forward about 2-3 months ago  Was seen and had x-rays done at that time but states she noticed some swelling to left groin area over the past few days   Pain increases with standing and movement.. Denies new injury  Ambulates with steady gait

## 2016-12-12 NOTE — ED Triage Notes (Signed)
Patient ambulatory to triage with steady gait, without difficulty or distress noted; pt reports left groin pain/swelling since Christmas; st had a fall 2-163mos ago "bruising" left hip; st pain increases with movement

## 2016-12-12 NOTE — ED Provider Notes (Signed)
[facility] Emergency Department Provider Note  ____________________________________________  Time seen: Approximately 8:10 AM  I have reviewed the triage vital signs and the nursing notes.   HISTORY  Chief Complaint Groin Swelling    HPI Michelle Newton is a 56 y.o. female that presents the emergency department with left leg and groin pain and swelling. Patient states that she fell 2 months ago on left hip and came to the ER. She states that pain and swelling have continuously gotten worse since fall. It is difficult to walk and lay on left side. Patient has taken Tylenol for pain. Patient has an MRI scheduled on January 20 of hip.Patient denies fever, back pain, calf pain, numbness, tingling.   Past Medical History:  Diagnosis Date  . Anxiety   . Asthma   . CAD (coronary artery disease)   . Chronic fatigue   . Chronic thumb pain, bilateral 09/12/2015  . COPD (chronic obstructive pulmonary disease) (HCC)   . Coronary artery disease   . Depression   . DJD (degenerative joint disease)   . DJD (degenerative joint disease)   . Essential hypertension 09/12/2015  . Family history of chronic pain 09/26/2015  . Fibromyalgia   . Fibromyalgia   . Fracture five ribs-closed 09/12/2015  . H/O acute myocardial infarction 12/14/2014  . H/O non-insulin dependent diabetes mellitus 09/12/2015  . History of cardiac arrhythmia 09/12/2015  . History of elbow surgery 09/26/2015   Left ulnar nerve transposition  . History of knee surgery 09/26/2015  . History of migraine 09/12/2015  . History of seizures 09/12/2015  . Hypercholesteremia   . MI (myocardial infarction)   . Migraine   . Migraine   . Osteoarthritis   . Right shoulder: Acute Pain due to trauma 04/18/2016  . Right shoulder: Chronic Pain 09/12/2015  . Seizures (HCC)   . Ulcerative (chronic) enterocolitis (HCC)   . Vertigo     Patient Active Problem List   Diagnosis Date Noted  . High risk for falls  10/15/2016  . Long term prescription benzodiazepine use 09/17/2016  . Chronic pain of shoulders (Location of Primary Source of Pain) (Bilateral) ((R>L) 07/23/2016  . Disturbance of skin sensation 06/05/2016  . S/P shoulder surgery (05/24/2016) (Right) 06/05/2016  . Acromial fracture 05/09/2016  . Abnormal MRI, shoulder (Right) 04/18/2016  . Right shoulder: Adhesive capsulitis 04/18/2016  . Right shoulder: Torn & retracted Infraspinatus tendon 04/18/2016  . Right shoulder: Torn & retracted Supraspinatus tendon (Complete, posterior full-thickness tear) 04/18/2016  . Right shoulder: Subscapularis tendinopathy 04/18/2016  . Right shoulder: Biceps tendinopathy 04/18/2016  . Right shoulder: Avascular necrosis of humeral head (HCC) 04/18/2016  . Right shoulder: Subacromial/Subdeltoid bursitis 04/18/2016  . Right shoulder:  Rotator cuff Disorder 02/22/2016  . Long term current use of opiate analgesic 11/13/2015  . Long term prescription opiate use 11/13/2015  . Opioid dependence, daily use (HCC) 11/13/2015  . Chronic cervical radicular pain (Left) 09/26/2015  . Chronic fatigue syndrome 09/26/2015  . Family history of chronic pain 09/26/2015  . History of knee surgery 09/26/2015  . History of elbow surgery 09/26/2015  . Vitamin D insufficiency 09/21/2015  . Encounter for therapeutic drug level monitoring 09/12/2015  . Encounter for long-term use of opiate analgesic 09/12/2015  . Opiate use (37.5 MME/Day) 09/12/2015  . Cervical facet syndrome (L) 09/12/2015  . Cervical spondylosis 09/12/2015  . Chronic neck pain (Location of Secondary source of pain) (Bilateral) (L>R) 09/12/2015  . Chronic hip pain (Left) 09/12/2015  . Lumbar facet  syndrome 09/12/2015  . Lumbar spondylosis 09/12/2015  . 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 Chronic low back pain 09/12/2015  . Fibromyalgia 09/12/2015  . Chronic pain syndrome 09/12/2015  . Chronic thumb pain (Bilateral) 09/12/2015  . Upper extremity  pain 09/12/2015  . Occipital neuralgia (Bilateral) 09/12/2015  . Smoker 09/12/2015  . Generalized anxiety disorder 09/12/2015  . Depression 09/12/2015  . Wrist arthritis 09/12/2015  . Fracture five ribs-closed 09/12/2015  . History of seizures 09/12/2015  . History of migraine 09/12/2015  . H/O non-insulin dependent diabetes mellitus 09/12/2015  . Essential hypertension 09/12/2015  . High cholesterol 09/12/2015  . Coronary artery disease 09/12/2015  . History of cardiac arrhythmia 09/12/2015  . H/O vertigo 09/12/2015  . H/O acute myocardial infarction 12/14/2014  . COPD, moderate (HCC) 12/14/2014  . Right shoulder: S/P Rotator cuff repair 11/15/2014  . Bladder cystocele 07/29/2014    Past Surgical History:  Procedure Laterality Date  . ABDOMINAL HYSTERECTOMY    . KNEE ARTHROPLASTY Left   . left elbow surgery    . LUMBAR DISC SURGERY    . ROTATOR CUFF REPAIR Right   . TRIGGER FINGER RELEASE Left   . ureter mesh      Prior to Admission medications   Medication Sig Start Date End Date Taking? Authorizing Provider  ADVAIR DISKUS 500-50 MCG/DOSE AEPB  08/13/16   Historical Provider, MD  albuterol (PROVENTIL) (2.5 MG/3ML) 0.083% nebulizer solution as needed.  12/28/14   Historical Provider, MD  albuterol-ipratropium (COMBIVENT) 18-103 MCG/ACT inhaler Inhale 2 puffs into the lungs every 6 (six) hours as needed for wheezing or shortness of breath.    Historical Provider, MD  aspirin EC 81 MG tablet Take 81 mg by mouth daily.    Historical Provider, MD  bisoprolol-hydrochlorothiazide [facility]) 10-6.25 MG tablet Take 1 tablet by mouth daily.    Historical Provider, MD  butalbital-acetaminophen-caffeine (FIORICET, ESGIC) 50-325-40 MG tablet Take 1 tablet by mouth every 4 (four) hours as needed for headache.    Historical Provider, MD  Calcium Carbonate-Vit D-Min (CALCIUM 1200 PO) Take by mouth daily.    Historical Provider, MD  Cholecalciferol (VITAMIN D) 2000 UNITS tablet Take 1 tablet  (2,000 Units total) by mouth daily. 09/21/15   Delano Metz, MD  Cinnamon ([facility] CINNAMON) 500 MG capsule Take 500 mg by mouth daily.    Historical Provider, MD  COMBIVENT RESPIMAT 20-100 MCG/ACT AERS respimat 3 (three) times daily as needed.  06/05/16   Historical Provider, MD  cyanocobalamin (,VITAMIN B-12,) 1000 MCG/ML injection Every 3 weeks 10/31/14   Historical Provider, MD  cyclobenzaprine (FLEXERIL) 10 MG tablet Take 1 tablet (10 mg total) by mouth at bedtime. 10/15/16 11/14/16  Delano Metz, MD  diazepam (VALIUM) 5 MG tablet Take 2.5 mg by mouth every 8 (eight) hours as needed for anxiety (takes 1/2 - 1 tablet as needed for anxiety).     Historical Provider, MD  dicyclomine (BENTYL) 10 MG capsule take 1 capsule by mouth four times a day if needed for ABDOMINAL SPASM 10/28/15   Historical Provider, MD  estradiol (CLIMARA - DOSED IN MG/24 HR) 0.1 mg/24hr patch Place 0.1 mg onto the skin once a week. On Wednesday    Historical Provider, MD  Fluticasone-Salmeterol (ADVAIR) 100-50 MCG/DOSE AEPB Inhale 1 puff into the lungs 2 (two) times daily.    Historical Provider, MD  hydrochlorothiazide (HYDRODIURIL) 12.5 MG tablet take 1 tablet by mouth once daily if needed for edema 06/14/16   Historical Provider, MD  lidocaine (  LIDODERM) 5 % Place 1 patch onto the skin daily. Remove & Discard patch within 12 hours or as directed by MD 12/12/16 12/12/17  Enid Derry, PA-C  Magnesium 400 MG TABS Take 1 tablet by mouth 2 (two) times daily.     Historical Provider, MD  magnesium oxide (MAG-OX) 400 MG tablet Take 1 tablet by mouth 2 (two) times daily. 10/10/16   Historical Provider, MD  metoprolol tartrate (LOPRESSOR) 25 MG tablet 25 mg 2 (two) times daily.  07/28/16   Historical Provider, MD  nitroGLYCERIN (NITROSTAT) 0.4 MG SL tablet Place 0.4 mg under the tongue every 5 (five) minutes as needed for chest pain.    Historical Provider, MD  oxyCODONE (OXY IR/ROXICODONE) 5 MG immediate release tablet Take 1  tablet (5 mg total) by mouth 3 (three) times daily. Max: 3/day 11/03/16 11/10/16  Delano Metz, MD  oxyCODONE (OXY IR/ROXICODONE) 5 MG immediate release tablet Take 1 tablet (5 mg total) by mouth 2 (two) times daily. Max: 2/day 11/10/16 11/17/16  Delano Metz, MD  oxyCODONE (OXY IR/ROXICODONE) 5 MG immediate release tablet Take 1 tablet (5 mg total) by mouth daily. Max: 1/day 11/17/16 11/24/16  Delano Metz, MD  oxyCODONE (OXY IR/ROXICODONE) 5 MG immediate release tablet Take 1 tablet (5 mg total) by mouth 4 (four) times daily. Max: 4/day 10/27/16 11/03/16  Delano Metz, MD  PARoxetine (PAXIL) 30 MG tablet Take 30 mg by mouth daily.    Historical Provider, MD  Potassium 95 MG TABS Take 1 tablet by mouth daily.    Historical Provider, MD  potassium chloride (K-DUR,KLOR-CON) 10 MEQ tablet  10/10/16   Historical Provider, MD  promethazine (PHENERGAN) 25 MG tablet Take 25 mg by mouth every 6 (six) hours as needed for nausea or vomiting.    Historical Provider, MD  promethazine-dextromethorphan (PROMETHAZINE-DM) 6.25-15 MG/5ML syrup take 5 milliliters by mouth every 4 hours if needed for cough 08/23/16   Historical Provider, MD  rosuvastatin (CRESTOR) 20 MG tablet Take 20 mg by mouth at bedtime.     Historical Provider, MD  vitamin E 1000 UNIT capsule Take 1,000 Units by mouth daily.    Historical Provider, MD    Allergies Cymbalta [duloxetine hcl]; Duloxetine; Flagyl [metronidazole]; Lyrica [pregabalin]; Vicodin [hydrocodone-acetaminophen]; Nsaids; and Tape  Family History  Problem Relation Age of Onset  . Breast cancer Cousin   . Breast cancer Maternal Aunt     Social History Social History  Substance Use Topics  . Smoking status: Current Every Day Smoker    Packs/day: 0.25    Types: Cigarettes  . Smokeless tobacco: Never Used  . Alcohol use No     Review of Systems  Constitutional: No fever/chills ENT: No upper respiratory complaints. Cardiovascular: No chest  pain. Respiratory: No cough. No SOB. Gastrointestinal: No abdominal pain.  No vomiting.  Genitourinary: Negative for dysuria. Skin: Negative for rash, abrasions, lacerations, ecchymosis. Neurological: Negative for headaches, numbness or tingling   ____________________________________________   PHYSICAL EXAM:  VITAL SIGNS: ED Triage Vitals  Enc Vitals Group     BP 12/12/16 0704 129/85     Pulse Rate 12/12/16 0704 85     Resp 12/12/16 0704 18     Temp 12/12/16 0704 97.9 F (36.6 C)     Temp Source 12/12/16 0704 Oral     SpO2 12/12/16 0704 97 %     Weight 12/12/16 0702 120 lb (54.4 kg)     Height 12/12/16 0702 4\' 11"  (1.499 m)     Head  Circumference --      Peak Flow --      Pain Score 12/12/16 0702 7     Pain Loc --      Pain Edu? --      Excl. in GC? --      Constitutional: Alert and oriented. Well appearing and in no acute distress. Eyes: Conjunctivae are normal. PERRL. EOMI. Head: Atraumatic. ENT:      Ears:      Nose: No congestion/rhinnorhea.      Mouth/Throat: Mucous membranes are moist.  Neck: No stridor.  Cardiovascular: Normal rate, regular rhythm. Normal S1 and S2.  Good peripheral circulation. 2+ dorsalis pedis and posterior tibialis pulses. Respiratory: Normal respiratory effort without tachypnea or retractions. Lungs CTAB. Good air entry to the bases with no decreased or absent breath sounds. Musculoskeletal: No gross deformities appreciated. Tenderness to palpation throughout inside of left groin and inside of left thigh. No calf pain. Pain with flexion, extension, abduction and abduction of left hip. No swelling noted. Neurologic:  Normal speech and language. No gross focal neurologic deficits are appreciated. Sensation of toes intact. Skin:  Skin is warm, dry and intact. No rash noted. Psychiatric: Mood and affect are normal. Speech and behavior are normal. Patient exhibits appropriate insight and  judgement.   ____________________________________________   LABS (all labs ordered are listed, but only abnormal results are displayed)  Labs Reviewed - No data to display ____________________________________________  EKG   ____________________________________________  RADIOLOGY   [place] Venous Img Lower Unilateral Left  Result Date: 12/12/2016 CLINICAL DATA:  Swelling for 2 months EXAM: LEFT LOWER EXTREMITY VENOUS DUPLEX ULTRASOUND TECHNIQUE: Doppler venous assessment of the left lower extremity deep venous system was performed, including characterization of spectral flow, compressibility, and phasicity. COMPARISON:  None. FINDINGS: There is complete compressibility of the left common femoral, femoral, and popliteal veins. Doppler analysis demonstrates respiratory phasicity and augmentation of flow with calf compression. No obvious superficial vein or calf vein thrombosis. IMPRESSION: No evidence of left lower extremity DVT. Electronically Signed   By: Jolaine ClickArthur  Hoss M.D.   On: 12/12/2016 08:37    ____________________________________________    PROCEDURES  Procedure(s) performed:    Procedures    Medications - No data to display   ____________________________________________   INITIAL IMPRESSION / ASSESSMENT AND PLAN / ED COURSE  Pertinent labs & imaging results that were available during my care of the patient were reviewed by me and considered in my medical decision making (see chart for details).  Review of the [place] CSRS was performed in accordance of the [facility] prior to dispensing any controlled drugs.  Clinical Course     Patient's diagnosis is consistent with groin strain. Ultrasound was performed because patient states that entire leg has been swelling. Ultrasound negative for DVT. Patient was encouraged to do stretching exercises for muscles but states she does not want to do stretching exercises because it is painful.  She has history of chronic pain and  fibromyalgia. Patient will be discharged home with prescriptions for lidocaine patch. Patient has MRI of hip on January 20. Patient is to follow up with PCP as directed. Patient is given ED precautions to return to the ED for any worsening or new symptoms.     ____________________________________________  FINAL CLINICAL IMPRESSION(S) / ED DIAGNOSES  Final diagnoses:  Groin strain, left, subsequent encounter      NEW MEDICATIONS STARTED DURING THIS VISIT:  Discharge Medication List as of 12/12/2016  9:24 AM    START  taking these medications   Details  lidocaine (LIDODERM) 5 % Place 1 patch onto the skin daily. Remove & Discard patch within 12 hours or as directed by MD, Starting Thu 12/12/2016, Until Fri 12/12/2017, Print            This chart was dictated using voice recognition software/Dragon. Despite best efforts to proofread, errors can occur which can change the meaning. Any change was purely unintentional.    Enid Derry, PA-C 12/12/16 1051    Jennye Moccasin, MD 12/12/16 1056

## 2016-12-16 ENCOUNTER — Ambulatory Visit: Payer: Medicare Other | Admitting: Pain Medicine

## 2016-12-23 ENCOUNTER — Ambulatory Visit
Admission: RE | Admit: 2016-12-23 | Discharge: 2016-12-23 | Disposition: A | Discharge status: Home / Self Care | Payer: Medicare Other | Source: Ambulatory Visit | Attending: Family Medicine | Admitting: Family Medicine

## 2016-12-23 DIAGNOSIS — M1612 Unilateral primary osteoarthritis, left hip: Secondary | ICD-10-CM

## 2016-12-23 DIAGNOSIS — W19XXXA Unspecified fall, initial encounter: Secondary | ICD-10-CM

## 2016-12-23 DIAGNOSIS — M25552 Pain in left hip: Secondary | ICD-10-CM | POA: Insufficient documentation

## 2016-12-23 DIAGNOSIS — S7012XA Contusion of left thigh, initial encounter: Secondary | ICD-10-CM | POA: Diagnosis not present

## 2016-12-23 DIAGNOSIS — M25452 Effusion, left hip: Secondary | ICD-10-CM | POA: Diagnosis not present

## 2016-12-23 DIAGNOSIS — S32402A Unspecified fracture of left acetabulum, initial encounter for closed fracture: Secondary | ICD-10-CM | POA: Insufficient documentation

## 2016-12-27 IMAGING — CR DG FOOT COMPLETE 3+V*R*
1 series · 3 of 3 positions shown · non-contrast
Comparison: None.

CLINICAL DATA: Status post fall; tripped over dog bed. Right foot
pain. Initial encounter.

EXAM:
RIGHT FOOT COMPLETE - 3+ VIEW

[Series 1: dg foot complete right · 0.14mm/px · 3 of 3 slices shown]
[im 1/3]
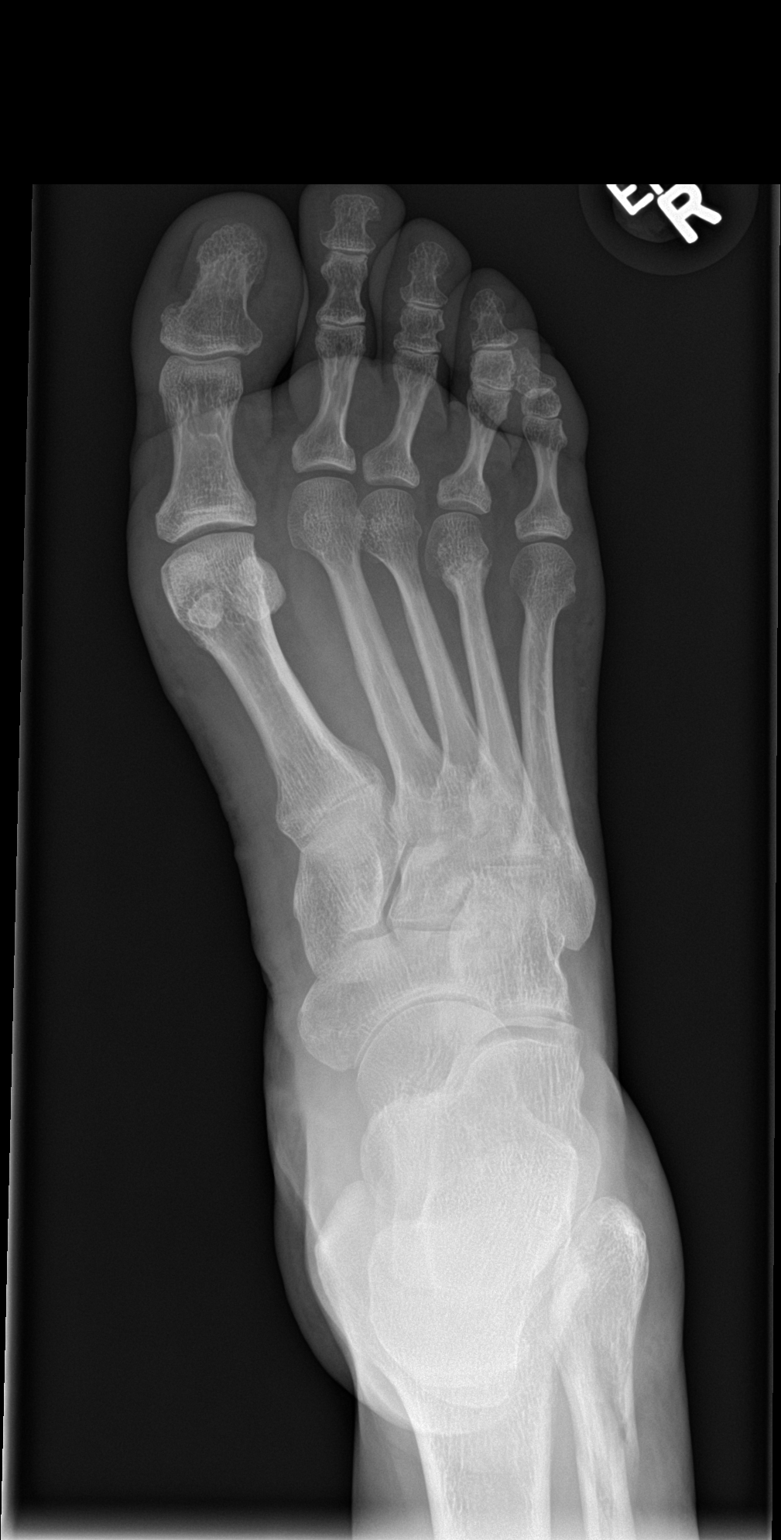
[im 2/3]
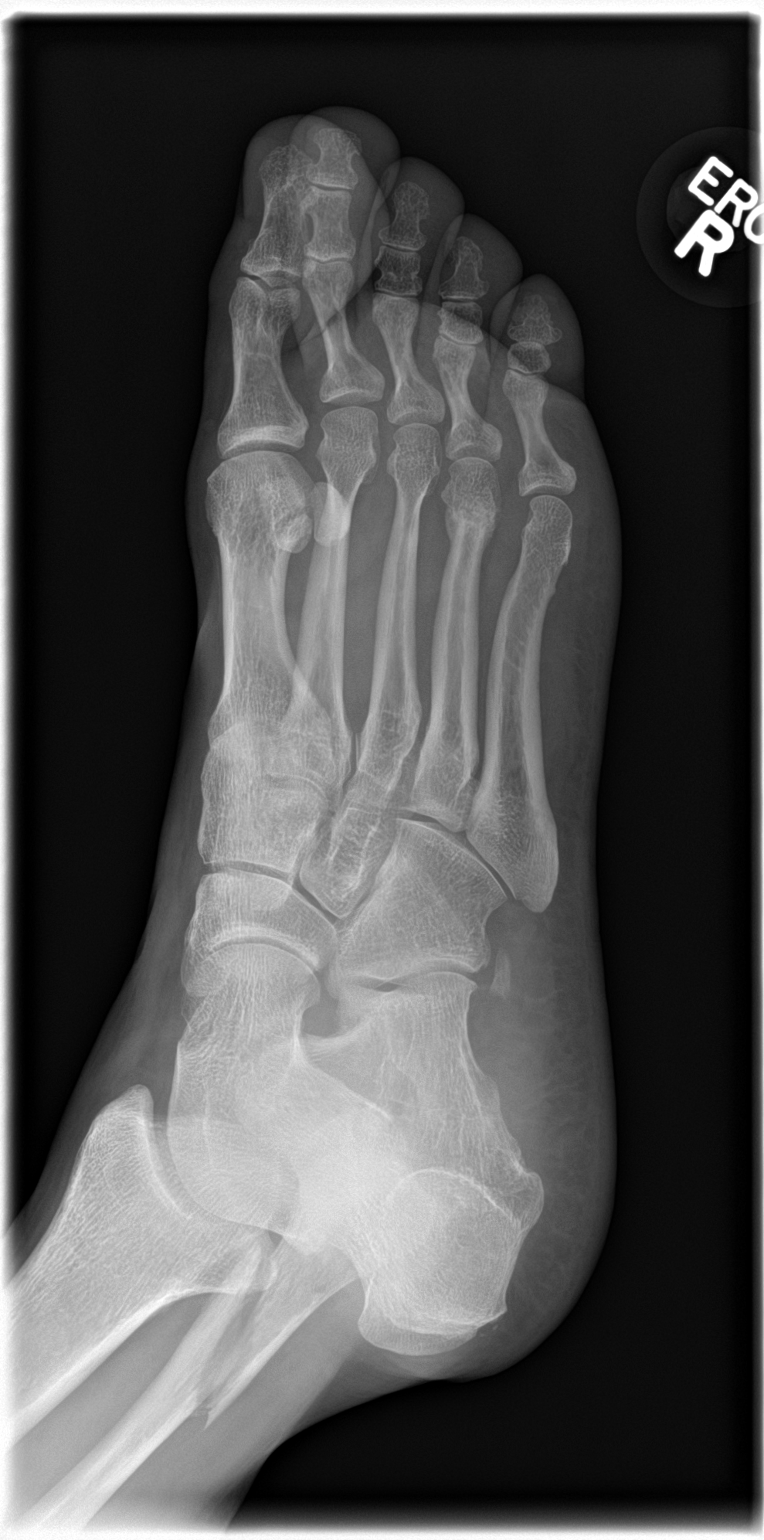
[im 3/3]
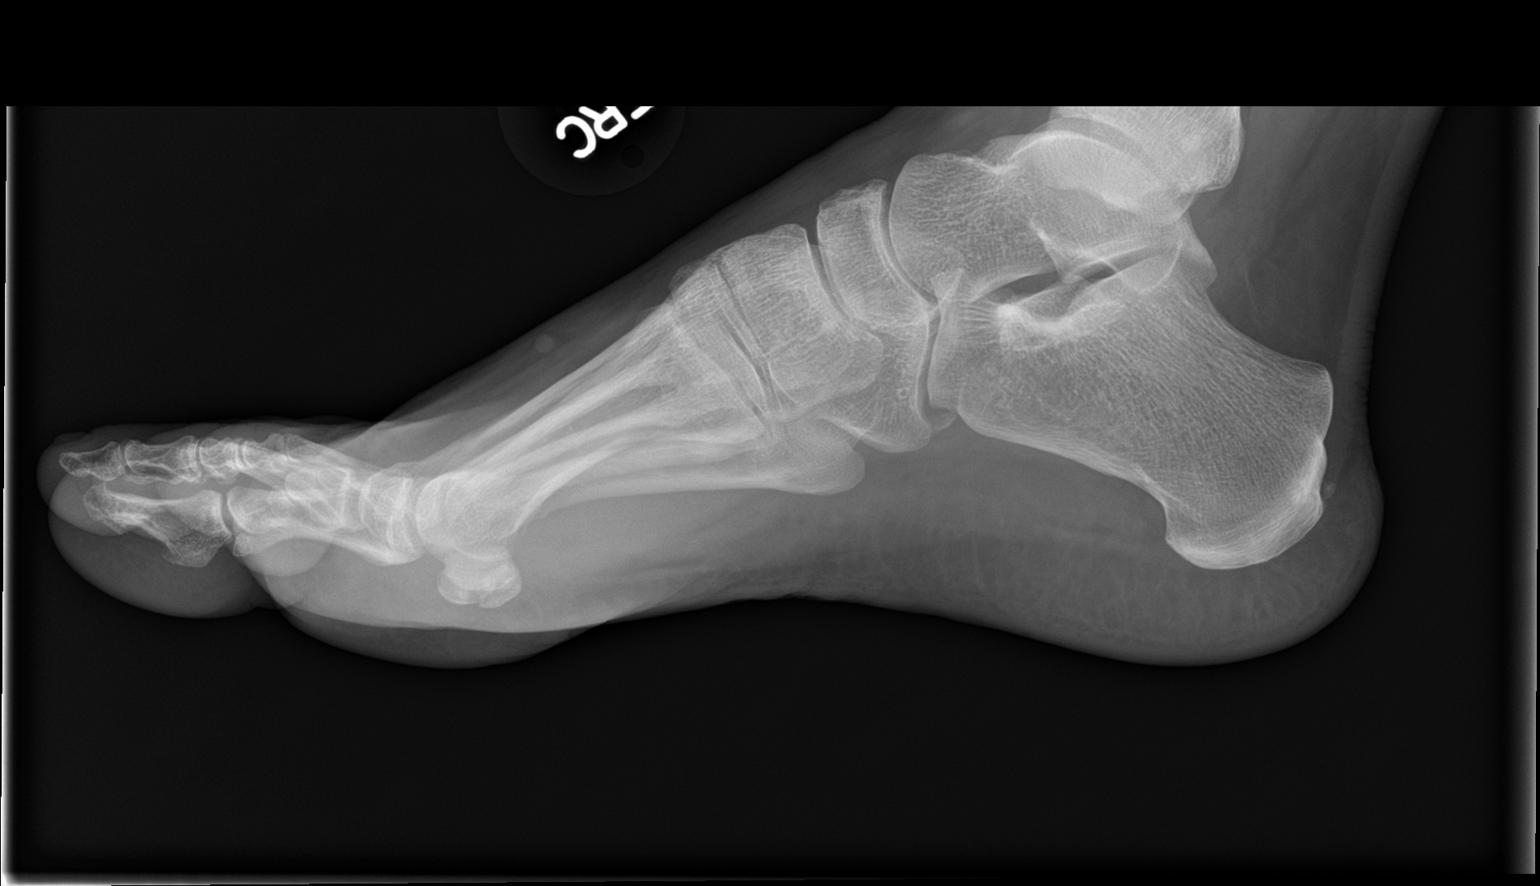

[3 of 3 positions shown; findings below may reference images not displayed]

FINDINGS: There is a mildly displaced fracture through the distal fibula. No
additional fractures are seen.

The joint spaces are preserved. There is no evidence of talar
subluxation; the subtalar joint is unremarkable in appearance. There
is a bipartite medial sesamoid of the first toe. An os peroneum is
noted.

No significant soft tissue abnormalities are seen.
IMPRESSION: 1. Mildly displaced fracture through the distal fibula.
2. Bipartite medial sesamoid of the first toe.
3. Os peroneum noted.

## 2016-12-27 IMAGING — CR DG ANKLE COMPLETE 3+V*R*
1 series · 3 of 3 positions shown · non-contrast
Comparison: Right ankle radiographs performed 05/17/2015

CLINICAL DATA: Status post fall, with pain and swelling about the
right ankle. Initial encounter.

EXAM:
RIGHT ANKLE - COMPLETE 3+ VIEW

[Series 1: dg ankle complete right · 0.14mm/px · 3 of 3 slices shown]
[im 1/3]
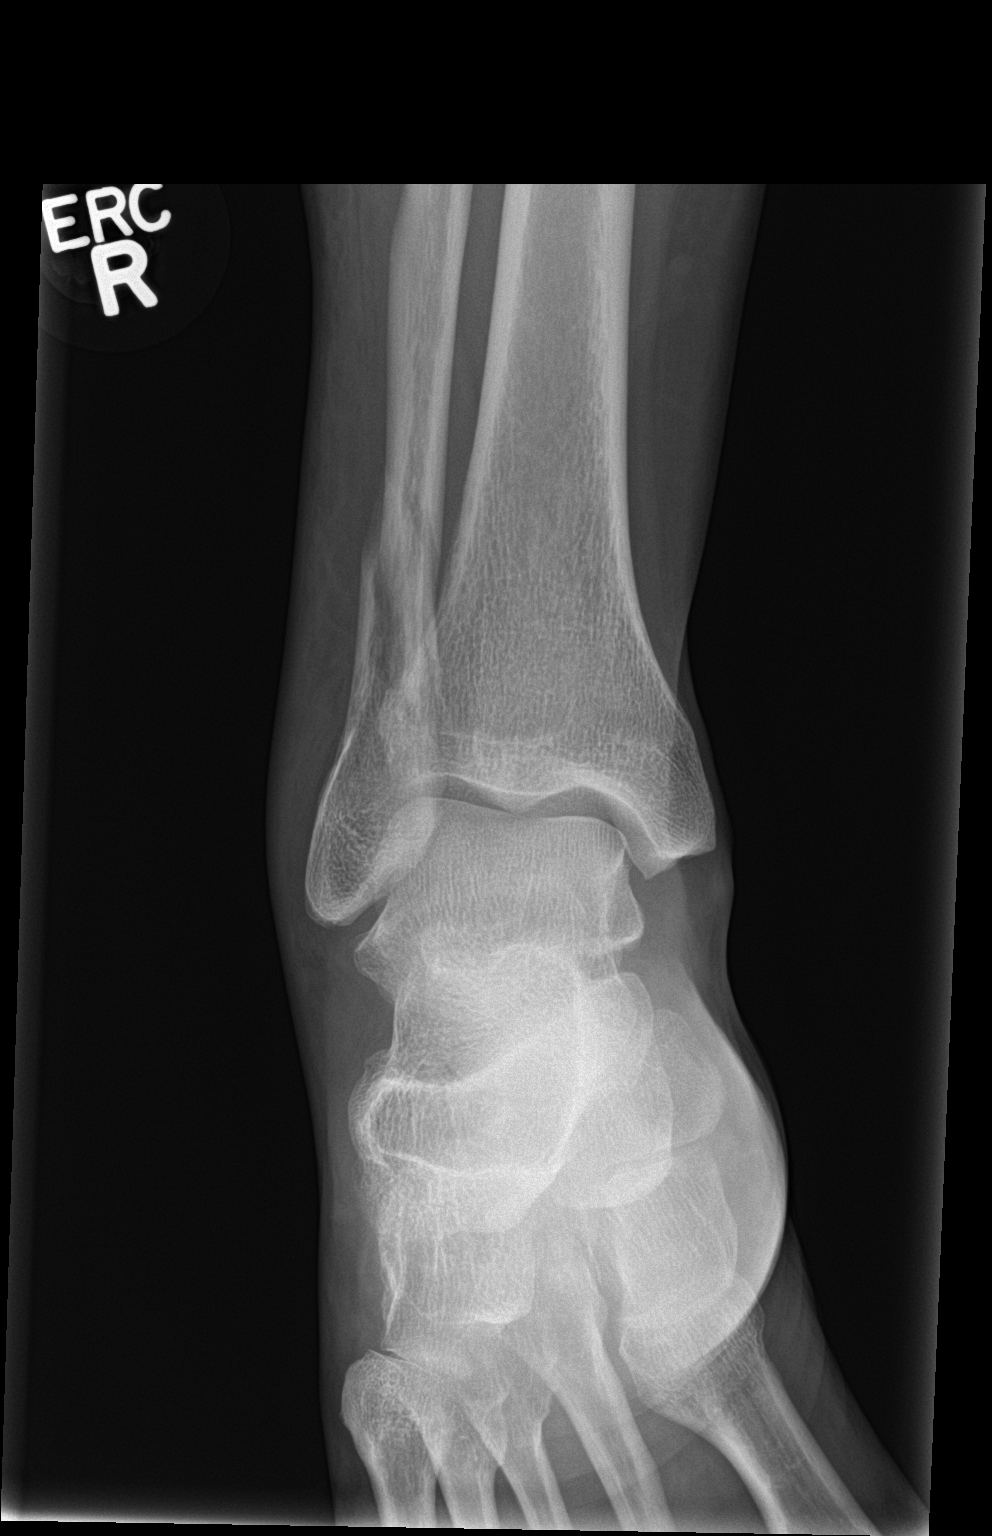
[im 2/3]
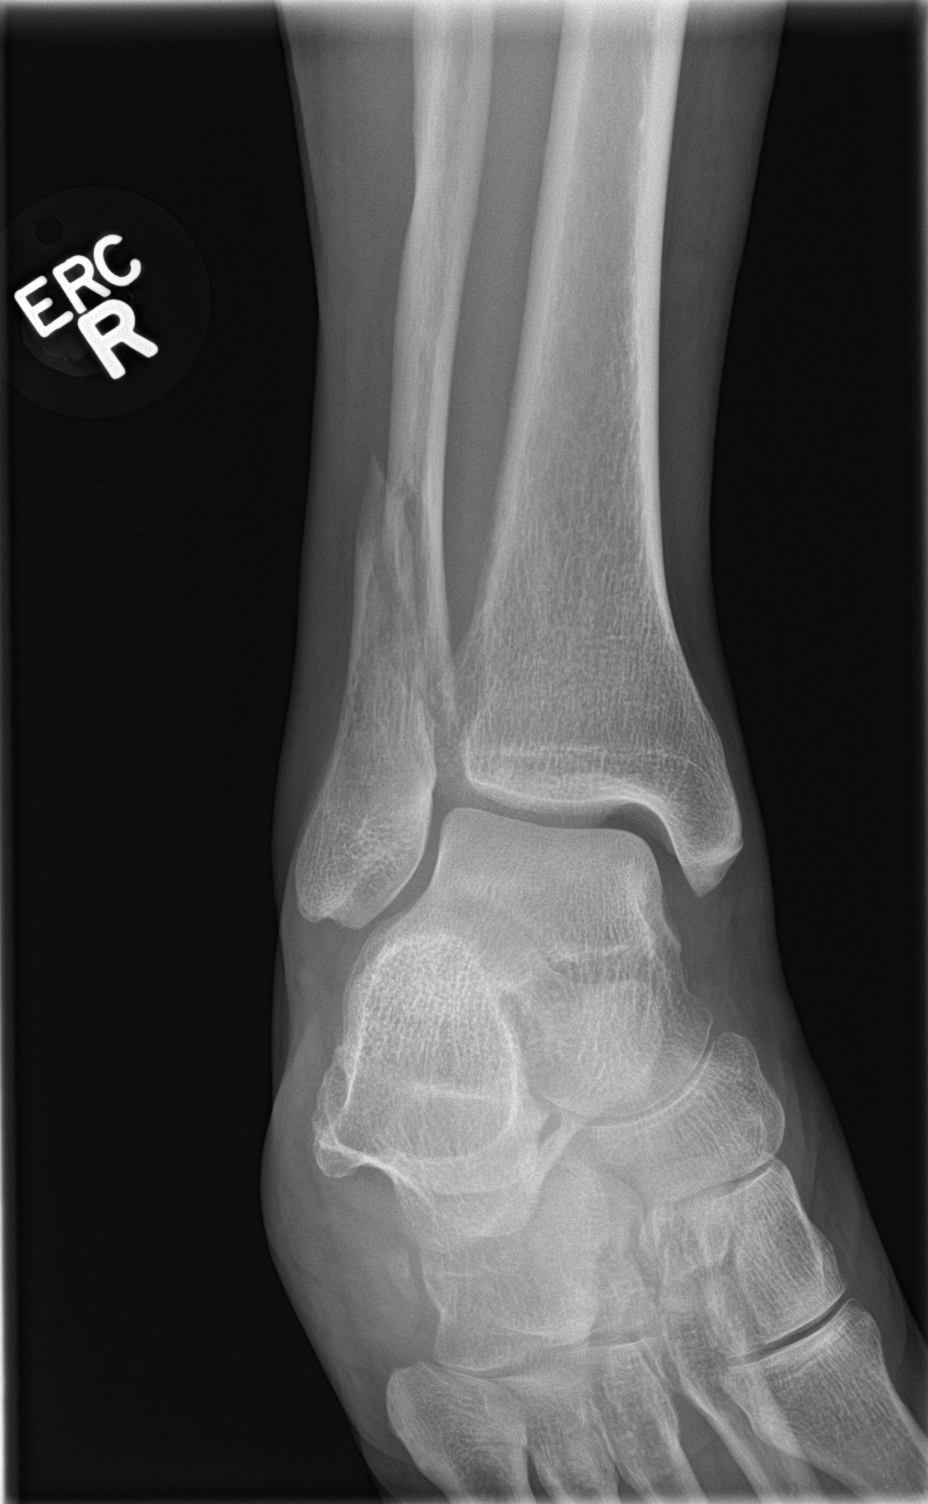
[im 3/3]
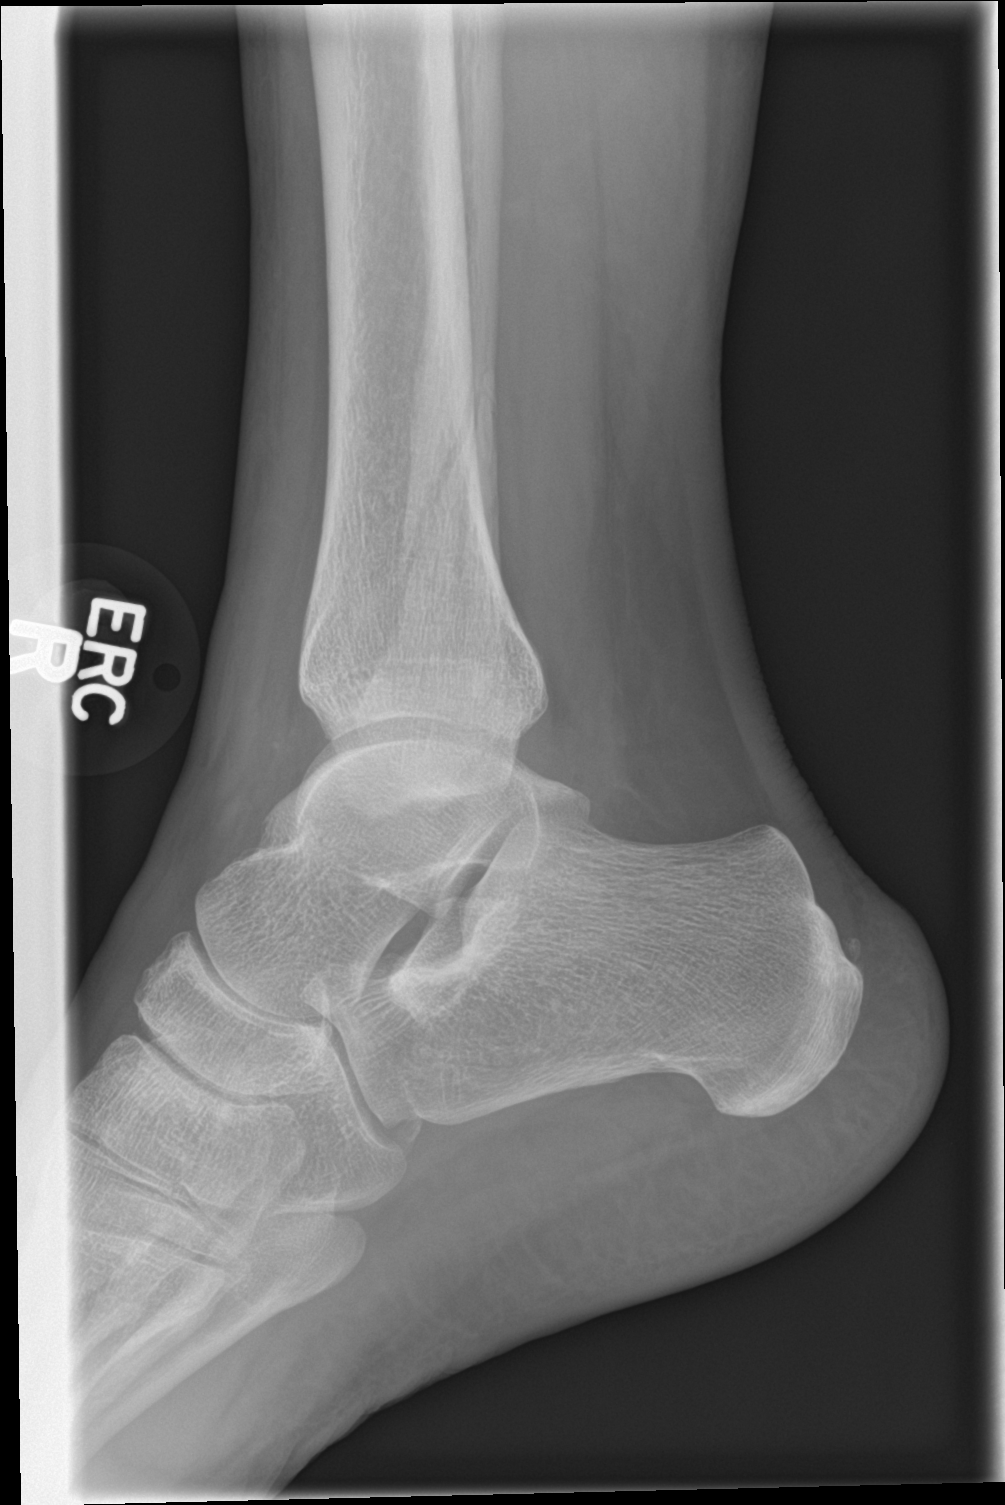

[3 of 3 positions shown; findings below may reference images not displayed]

FINDINGS: There is a mildly displaced fracture through the distal fibula.

The ankle mortise is intact; the interosseous space is grossly
unremarkable. No talar tilt or subluxation is seen. A small ossicle
is noted at the distal Achilles tendon.

The joint spaces are preserved. Mild soft tissue swelling is noted
overlying the fracture site.
IMPRESSION: Mildly displaced fracture through the distal fibula.

## 2016-12-29 IMAGING — CR DG HIP (WITH OR WITHOUT PELVIS) 2-3V*R*
1 series · 3 of 3 positions shown · non-contrast
Comparison: 10/06/2014

CLINICAL DATA: Posterior right hip pain after fall from couch this
morning.

EXAM:
DG HIP (WITH OR WITHOUT PELVIS) 2-3V RIGHT

[Series 1: view not recorded · 0.14mm/px · 3 of 3 slices shown]
[im 1/3]
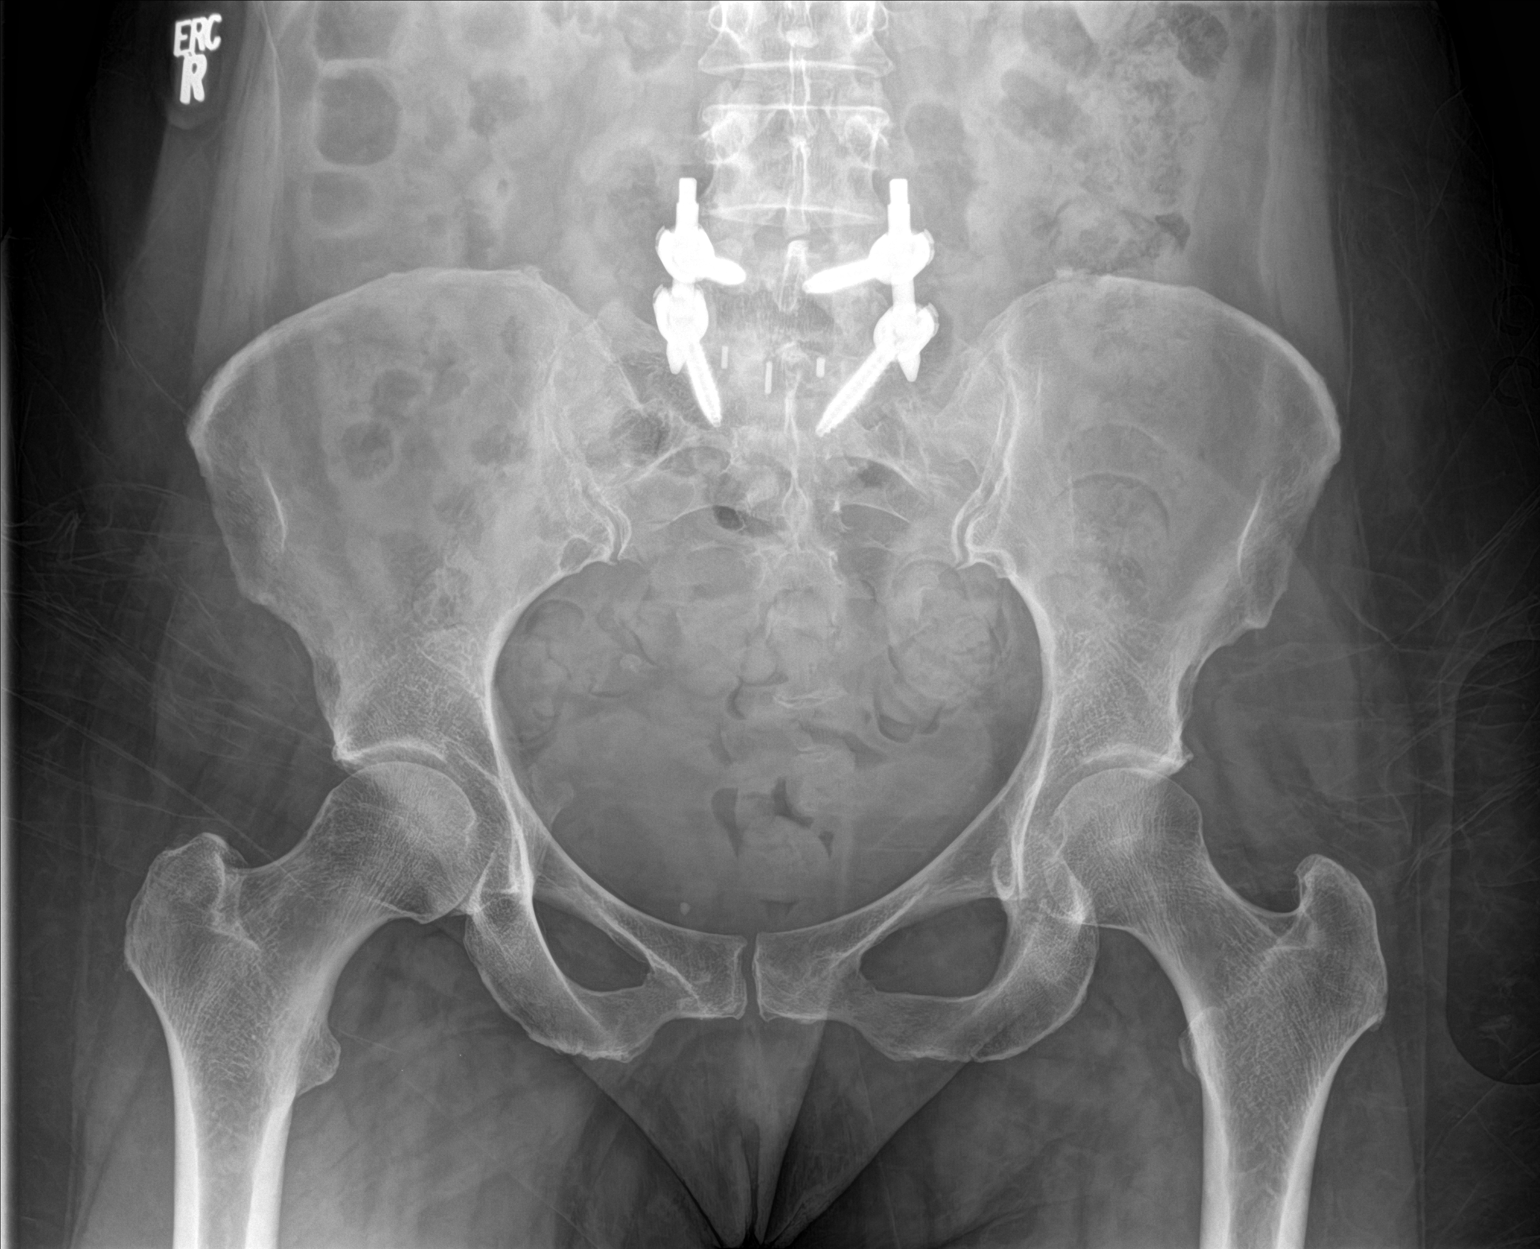
[im 2/3]
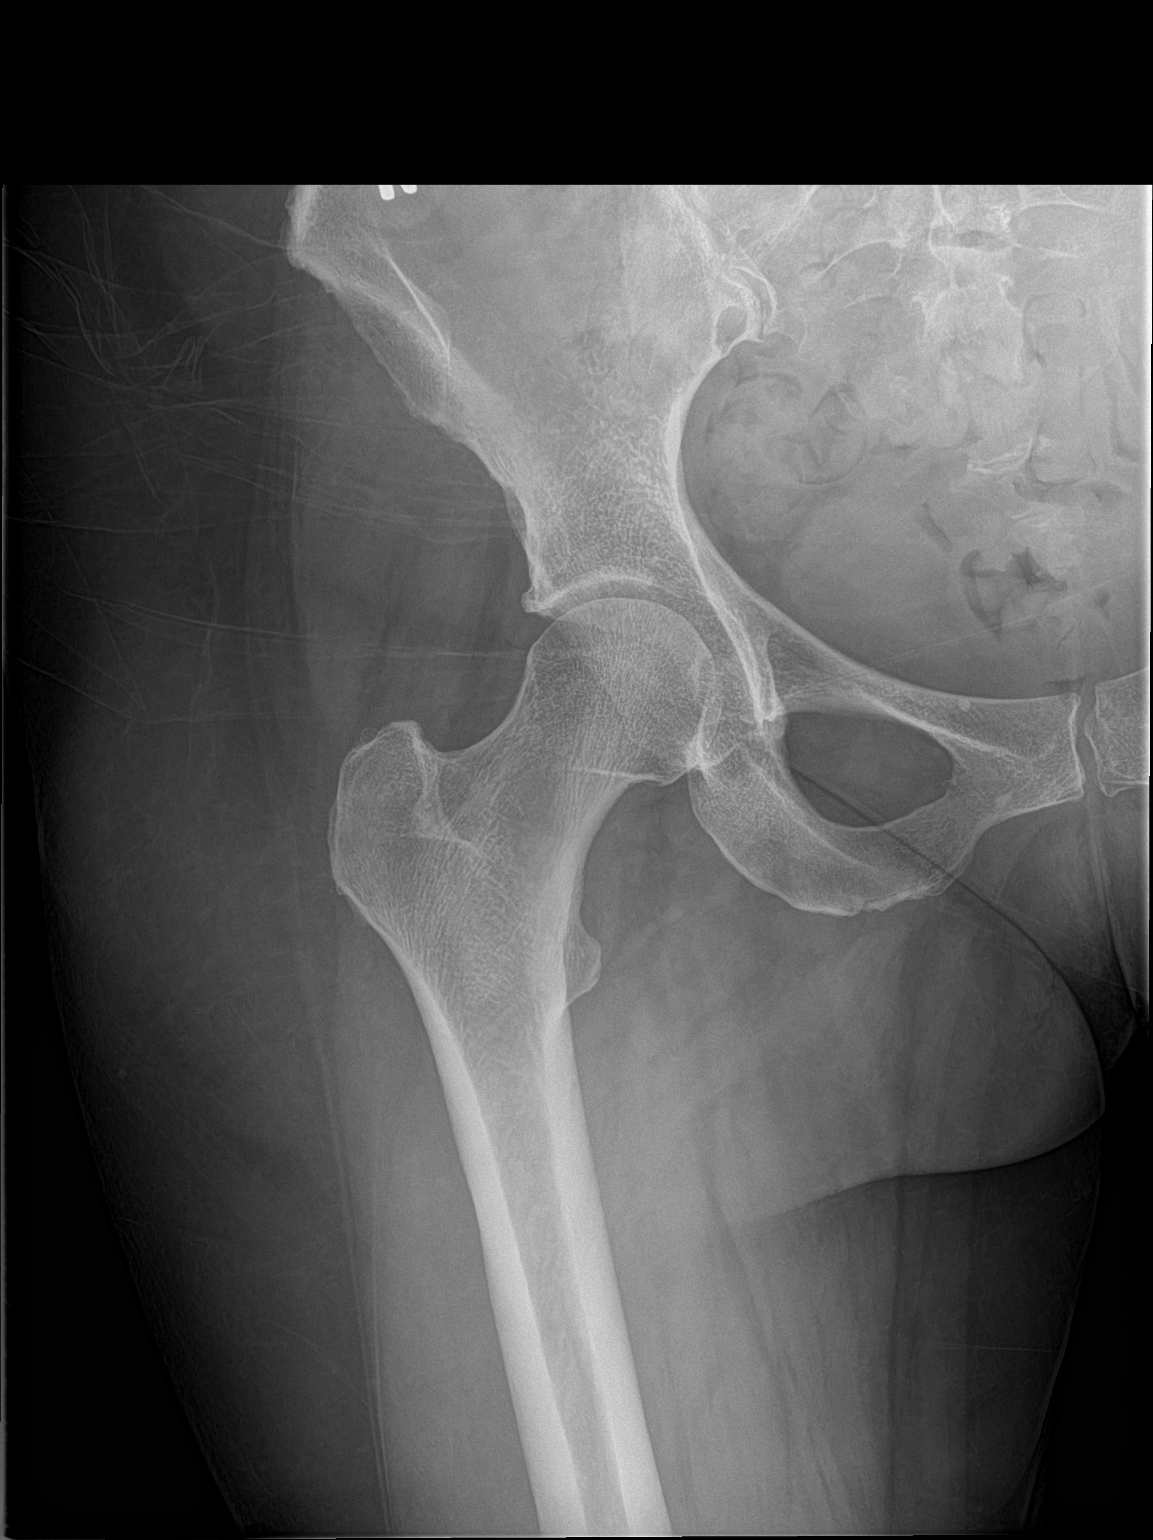
[im 3/3]
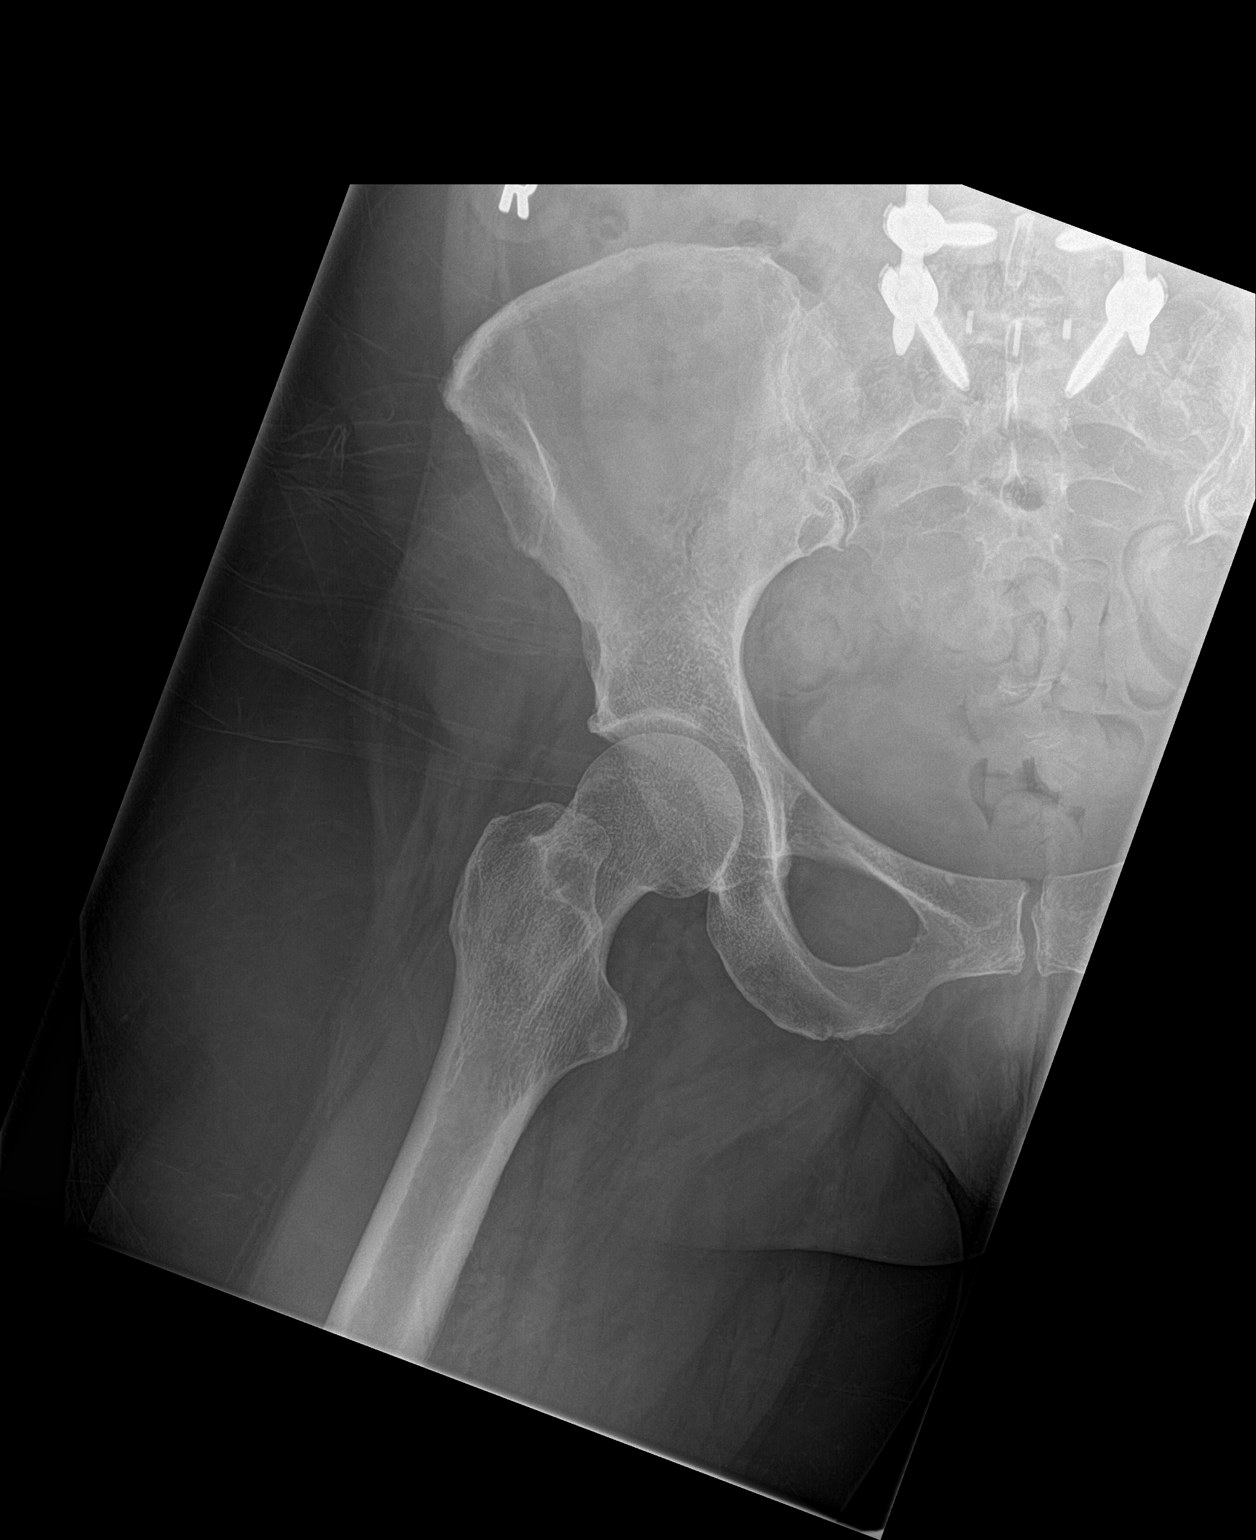

[3 of 3 positions shown; findings below may reference images not displayed]

FINDINGS: There is no evidence of hip fracture or dislocation. There is no
evidence of arthropathy or other focal bone abnormality.
Postoperative changes with fixation of the lumbosacral interspace.
IMPRESSION: Negative.

## 2016-12-29 IMAGING — CR DG ANKLE COMPLETE 3+V*R*
1 series · 3 of 3 positions shown · non-contrast
Comparison: Prior radiograph from 09/09/2015.

CLINICAL DATA: Initial evaluation for acute trauma, fall.

EXAM:
RIGHT ANKLE - COMPLETE 3+ VIEW

[Series 1: dg ankle complete right · 0.14mm/px · 3 of 3 slices shown]
[im 1/3]
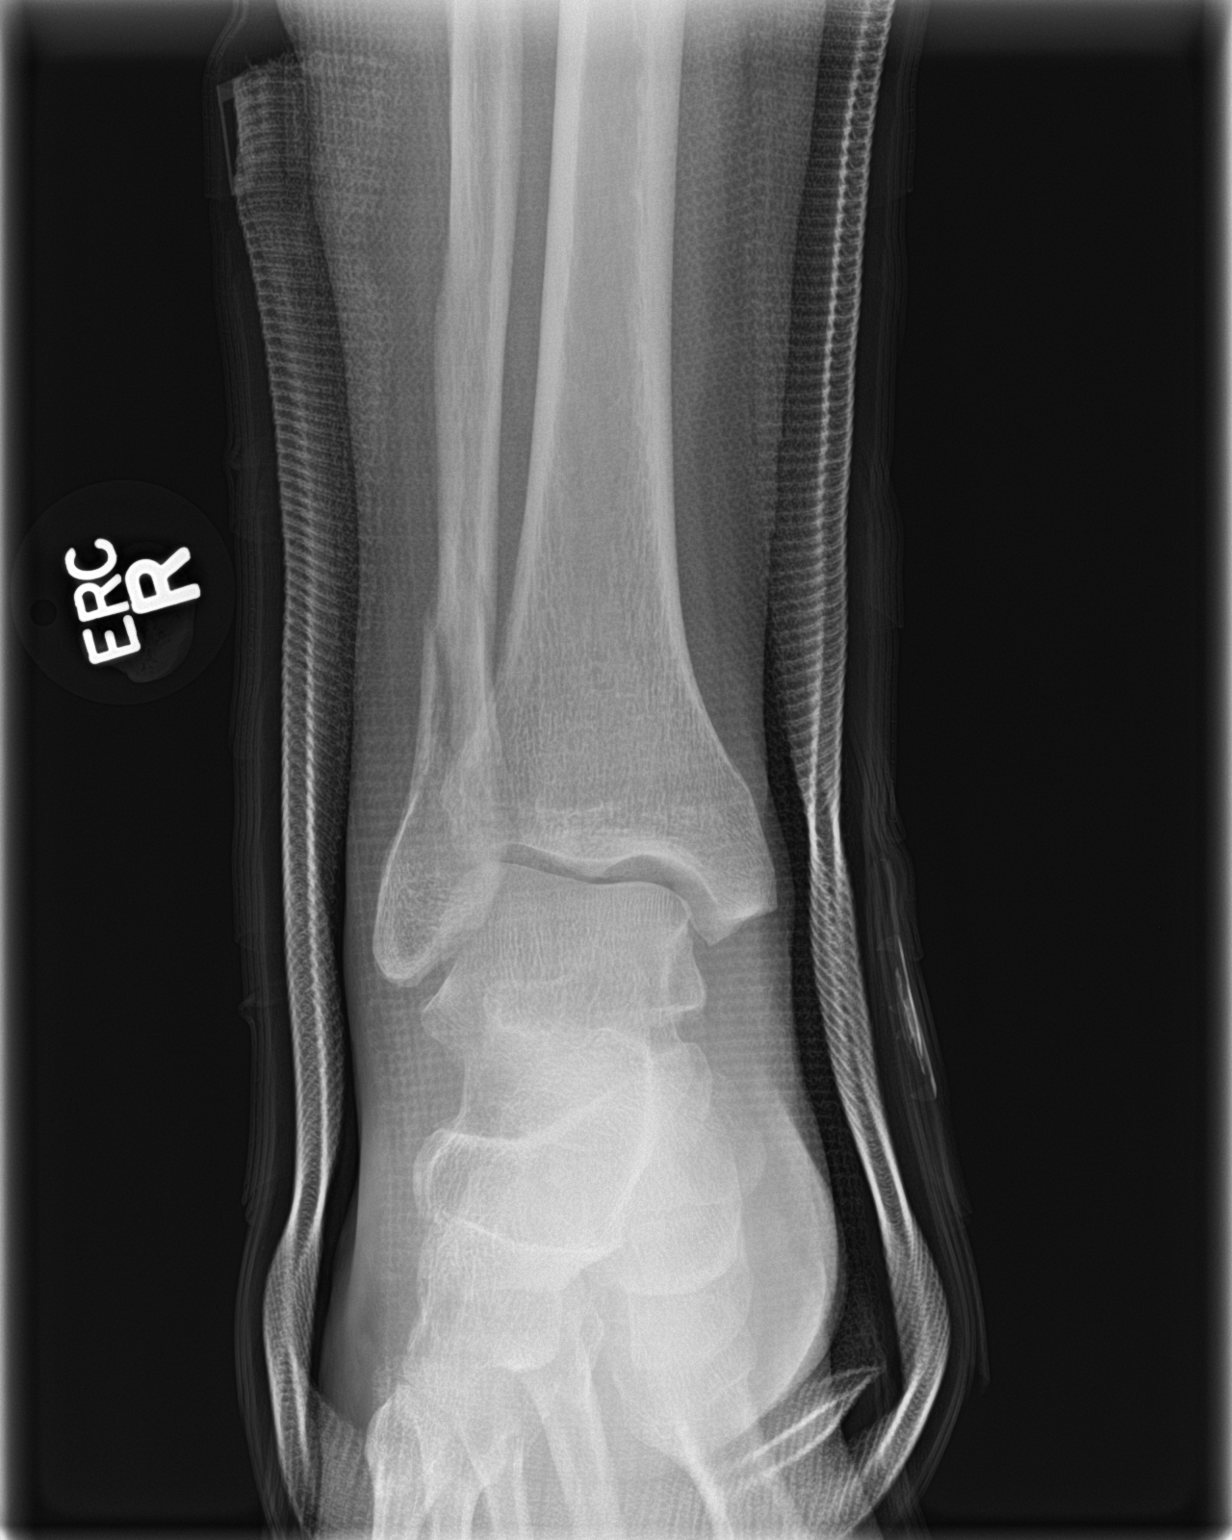
[im 2/3]
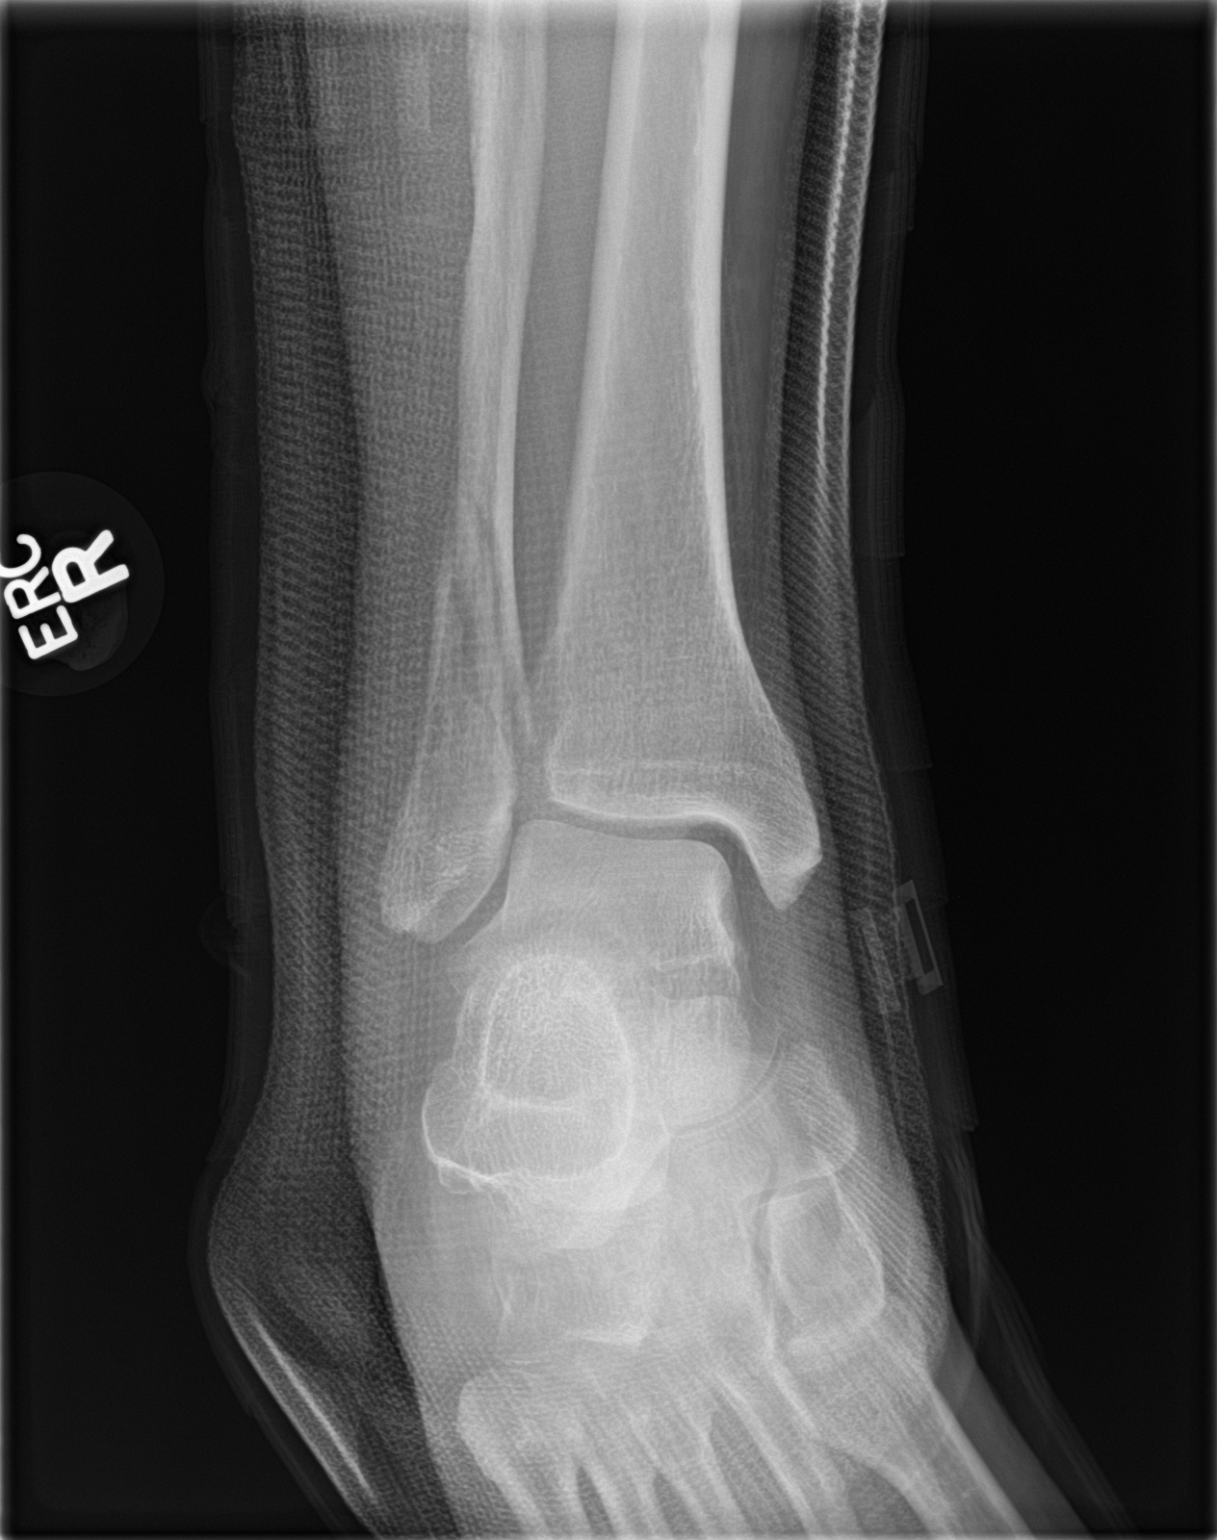
[im 3/3]
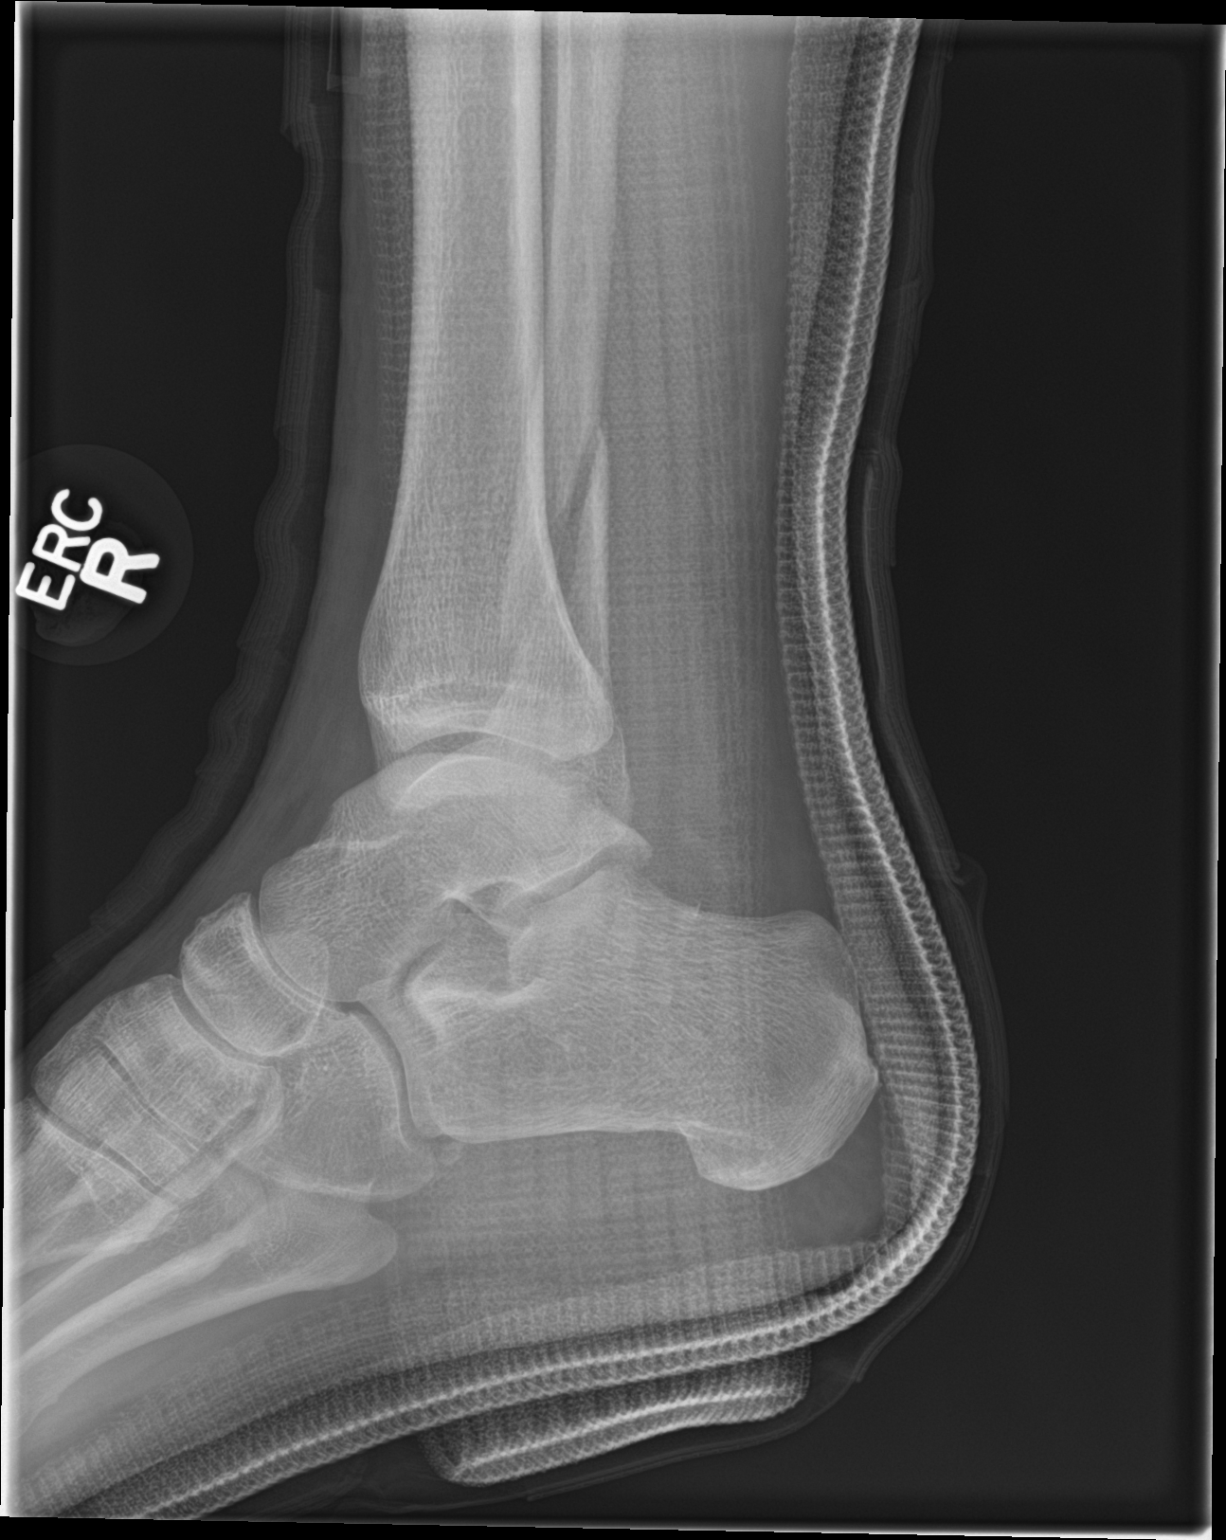

[3 of 3 positions shown; findings below may reference images not displayed]

FINDINGS: Splinting material overlies the ankle, somewhat limiting evaluation
for fine osseous detail. Previously identified mildly displaced
oblique fracture through the distal fibula is grossly stable in
position alignment. No interval worsening of displacement or new
angulation. The ankle mortise remains approximated. No new fracture.
Talar dome intact.

Soft tissue swelling noted about the ankle.
IMPRESSION: 1. Grossly stable position and alignment of oblique minimally
displaced distal right fibular fracture.
2. No new acute osseous abnormality about the ankle.

## 2016-12-29 IMAGING — CR DG ABDOMEN 1V
1 series · 1 of 1 positions shown · non-contrast
Comparison: CT abdomen and pelvis 10/27/2013

CLINICAL DATA: Pain after a fall.  Unable to urinate.

EXAM:
ABDOMEN - 1 VIEW

[dg abd 1 view]
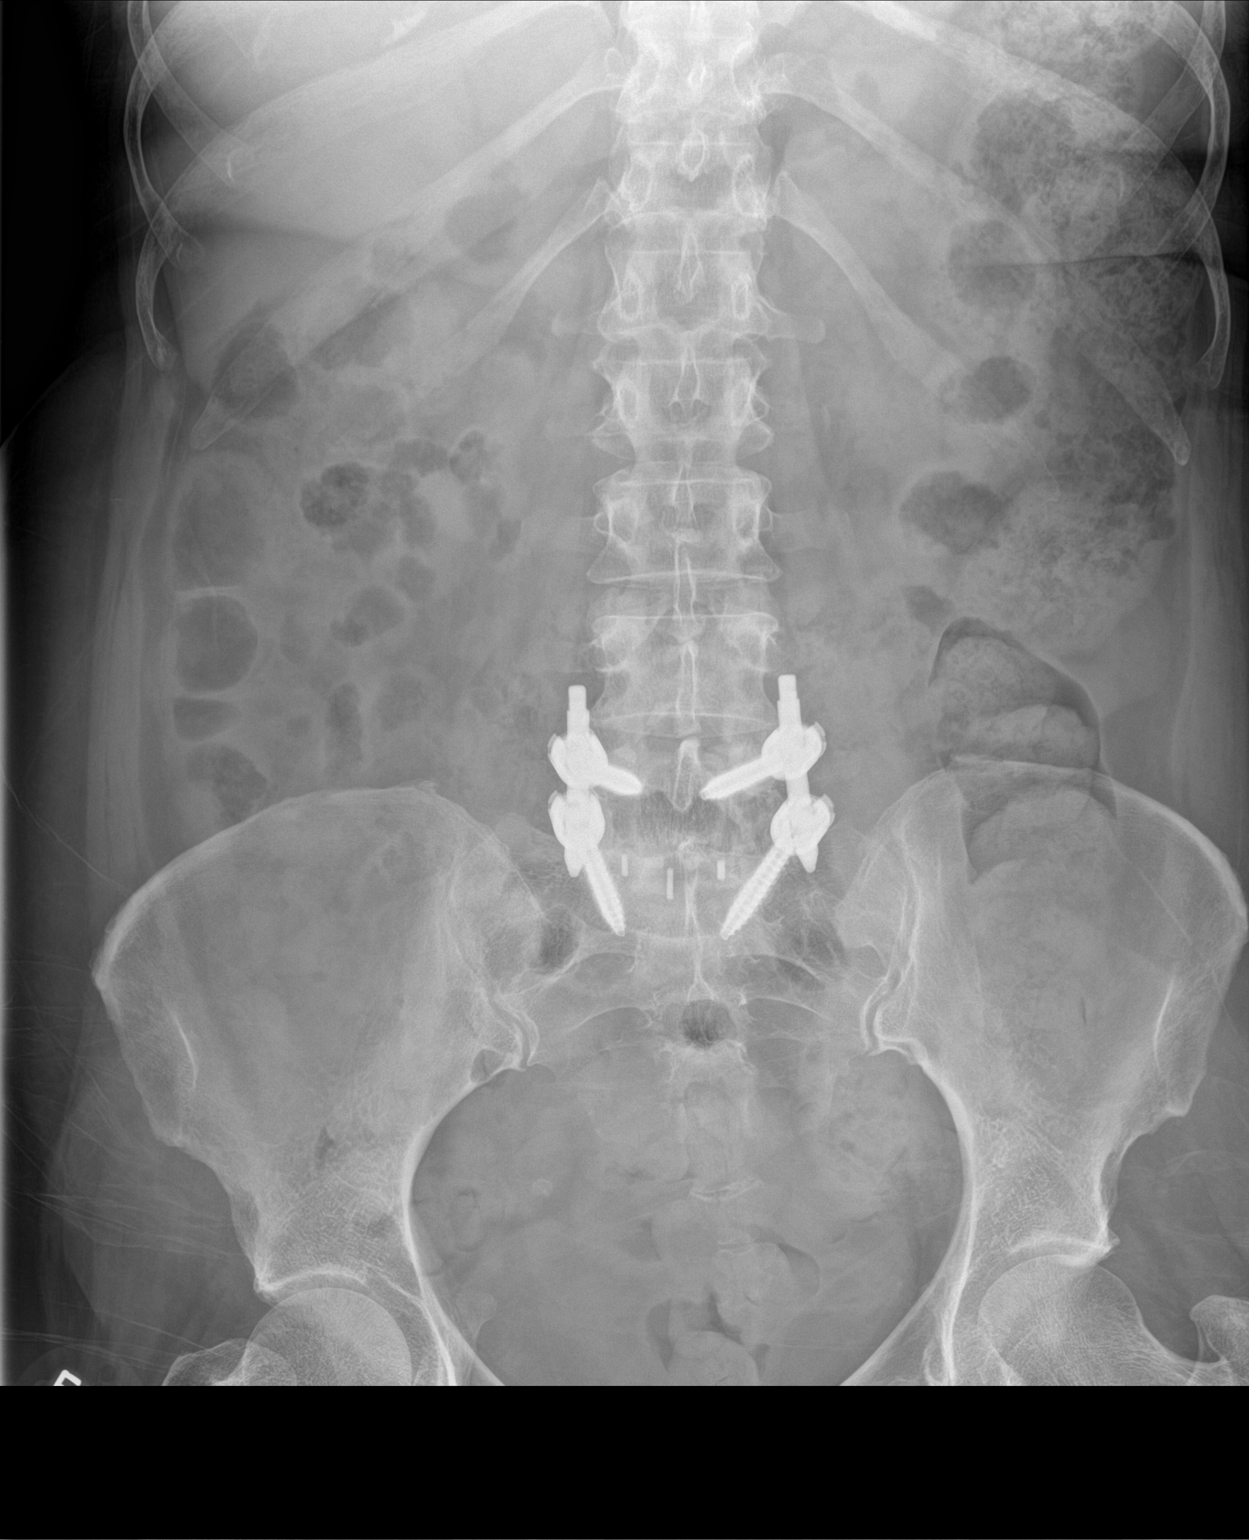

[1 of 1 positions shown; findings below may reference images not displayed]

FINDINGS: Gas and stool throughout the colon. No small or large bowel
distention. No radiopaque stones. Postoperative changes with
posterior rod and screw fixation and intervertebral fusion at L5-S1.
IMPRESSION: Normal nonobstructive bowel gas pattern.  Stool-filled colon.

## 2017-05-07 ENCOUNTER — Emergency Department
Admission: EM | Admit: 2017-05-07 | Discharge: 2017-05-07 | Disposition: A | Discharge status: Home / Self Care | Payer: Medicare Other | Attending: Student in an Organized Health Care Education/Training Program | Admitting: Student in an Organized Health Care Education/Training Program

## 2017-05-07 ENCOUNTER — Emergency Department: Payer: Medicare Other

## 2017-05-07 ENCOUNTER — Encounter: Payer: Self-pay | Admitting: Medical Oncology

## 2017-05-07 DIAGNOSIS — Z23 Encounter for immunization: Secondary | ICD-10-CM | POA: Diagnosis not present

## 2017-05-07 DIAGNOSIS — S61211A Laceration without foreign body of left index finger without damage to nail, initial encounter: Secondary | ICD-10-CM | POA: Insufficient documentation

## 2017-05-07 DIAGNOSIS — Z7982 Long term (current) use of aspirin: Secondary | ICD-10-CM | POA: Diagnosis not present

## 2017-05-07 DIAGNOSIS — J449 Chronic obstructive pulmonary disease, unspecified: Secondary | ICD-10-CM | POA: Insufficient documentation

## 2017-05-07 DIAGNOSIS — W293XXA Contact with powered garden and outdoor hand tools and machinery, initial encounter: Secondary | ICD-10-CM | POA: Diagnosis not present

## 2017-05-07 DIAGNOSIS — J45909 Unspecified asthma, uncomplicated: Secondary | ICD-10-CM | POA: Diagnosis not present

## 2017-05-07 DIAGNOSIS — Y929 Unspecified place or not applicable: Secondary | ICD-10-CM | POA: Diagnosis not present

## 2017-05-07 DIAGNOSIS — I2510 Atherosclerotic heart disease of native coronary artery without angina pectoris: Secondary | ICD-10-CM | POA: Diagnosis not present

## 2017-05-07 DIAGNOSIS — Y999 Unspecified external cause status: Secondary | ICD-10-CM | POA: Insufficient documentation

## 2017-05-07 DIAGNOSIS — Y939 Activity, unspecified: Secondary | ICD-10-CM | POA: Diagnosis not present

## 2017-05-07 DIAGNOSIS — Z79899 Other long term (current) drug therapy: Secondary | ICD-10-CM | POA: Insufficient documentation

## 2017-05-07 MED ORDER — ONDANSETRON HCL 4 MG PO TABS: 4.0000 mg | ORAL_TABLET | Freq: Three times a day (TID) | ORAL | 1 refills | Status: AC | PRN

## 2017-05-07 MED ORDER — LIDOCAINE HCL (PF) 1 % IJ SOLN
INTRAMUSCULAR | Status: DC
Start: 2017-05-07 — End: 2017-05-07
  Filled 2017-05-07: qty 5

## 2017-05-07 MED ORDER — TRAMADOL HCL 50 MG PO TABS: 50.0000 mg | ORAL_TABLET | Freq: Two times a day (BID) | ORAL | 0 refills | Status: AC | PRN

## 2017-05-07 MED ORDER — TETANUS-DIPHTH-ACELL PERTUSSIS 5-2.5-18.5 LF-MCG/0.5 IM SUSP
0.5000 mL | Freq: Once | INTRAMUSCULAR | Status: AC
  Administered 2017-05-07: 0.5 mL via INTRAMUSCULAR
  Filled 2017-05-07: qty 0.5

## 2017-05-07 MED ORDER — CEPHALEXIN 500 MG PO CAPS: 500.0000 mg | ORAL_CAPSULE | Freq: Three times a day (TID) | ORAL | 0 refills | Status: AC

## 2017-05-07 MED ORDER — LIDOCAINE HCL 1 % IJ SOLN
5.0000 mL | Freq: Once | INTRAMUSCULAR | Status: AC
  Administered 2017-05-07: 5 mL
  Filled 2017-05-07: qty 5

## 2017-05-07 NOTE — ED Notes (Signed)
Pt discharged home after verbalizing understanding of discharge instructions; nad noted. 

## 2017-05-07 NOTE — ED Provider Notes (Signed)
[facility] Emergency Department Provider Note  ____________________________________________  Time seen: Approximately 4:37 PM  I have reviewed the triage vital signs and the nursing notes.   HISTORY  Chief Complaint Laceration    HPI Michelle Newton is a 56 y.o. female presenting to the emergency department with 2 left index finger lacerations sustained after patient was attempting to trim her bushes outside with a hedge trimmer.Hemostasis has been achieved prior to presenting to the emergency department. Patient denies paresthesias, radiculopathy, coldness of the left upper extremity and weakness. No other alleviating measures were attempted. Patient cannot remember last tetanus shot.    Past Medical History:  Diagnosis Date  . Anxiety   . Asthma   . CAD (coronary artery disease)   . Chronic fatigue   . Chronic thumb pain, bilateral 09/12/2015  . COPD (chronic obstructive pulmonary disease) (HCC)   . Coronary artery disease   . Depression   . DJD (degenerative joint disease)   . DJD (degenerative joint disease)   . Essential hypertension 09/12/2015  . Family history of chronic pain 09/26/2015  . Fibromyalgia   . Fibromyalgia   . Fracture five ribs-closed 09/12/2015  . H/O acute myocardial infarction 12/14/2014  . H/O non-insulin dependent diabetes mellitus 09/12/2015  . History of cardiac arrhythmia 09/12/2015  . History of elbow surgery 09/26/2015   Left ulnar nerve transposition  . History of knee surgery 09/26/2015  . History of migraine 09/12/2015  . History of seizures 09/12/2015  . Hypercholesteremia   . MI (myocardial infarction) (HCC)   . Migraine   . Migraine   . Osteoarthritis   . Right shoulder: Acute Pain due to trauma 04/18/2016  . Right shoulder: Chronic Pain 09/12/2015  . Seizures (HCC)   . Ulcerative (chronic) enterocolitis (HCC)   . Vertigo     Patient Active Problem List   Diagnosis Date Noted  . High risk for falls  10/15/2016  . Long term prescription benzodiazepine use 09/17/2016  . Chronic pain of shoulders (Location of Primary Source of Pain) (Bilateral) ((R>L) 07/23/2016  . Disturbance of skin sensation 06/05/2016  . S/P shoulder surgery (05/24/2016) (Right) 06/05/2016  . Acromial fracture 05/09/2016  . Abnormal MRI, shoulder (Right) 04/18/2016  . Right shoulder: Adhesive capsulitis 04/18/2016  . Right shoulder: Torn & retracted Infraspinatus tendon 04/18/2016  . Right shoulder: Torn & retracted Supraspinatus tendon (Complete, posterior full-thickness tear) 04/18/2016  . Right shoulder: Subscapularis tendinopathy 04/18/2016  . Right shoulder: Biceps tendinopathy 04/18/2016  . Right shoulder: Avascular necrosis of humeral head (HCC) 04/18/2016  . Right shoulder: Subacromial/Subdeltoid bursitis 04/18/2016  . Right shoulder:  Rotator cuff Disorder 02/22/2016  . Long term current use of opiate analgesic 11/13/2015  . Long term prescription opiate use 11/13/2015  . Opioid dependence, daily use (HCC) 11/13/2015  . Chronic cervical radicular pain (Left) 09/26/2015  . Chronic fatigue syndrome 09/26/2015  . Family history of chronic pain 09/26/2015  . History of knee surgery 09/26/2015  . History of elbow surgery 09/26/2015  . Vitamin D insufficiency 09/21/2015  . Encounter for therapeutic drug level monitoring 09/12/2015  . Encounter for long-term use of opiate analgesic 09/12/2015  . Opiate use (37.5 MME/Day) 09/12/2015  . Cervical facet syndrome (L) 09/12/2015  . Cervical spondylosis 09/12/2015  . Chronic neck pain (Location of Secondary source of pain) (Bilateral) (L>R) 09/12/2015  . Chronic hip pain (Left) 09/12/2015  . Lumbar facet syndrome 09/12/2015  . Lumbar spondylosis 09/12/2015  . 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  Chronic low back pain 09/12/2015  . Fibromyalgia 09/12/2015  . Chronic pain syndrome 09/12/2015  . Chronic thumb pain (Bilateral) 09/12/2015  . Upper extremity  pain 09/12/2015  . Occipital neuralgia (Bilateral) 09/12/2015  . Smoker 09/12/2015  . Generalized anxiety disorder 09/12/2015  . Depression 09/12/2015  . Wrist arthritis 09/12/2015  . Fracture five ribs-closed 09/12/2015  . History of seizures 09/12/2015  . History of migraine 09/12/2015  . H/O non-insulin dependent diabetes mellitus 09/12/2015  . Essential hypertension 09/12/2015  . High cholesterol 09/12/2015  . Coronary artery disease 09/12/2015  . History of cardiac arrhythmia 09/12/2015  . H/O vertigo 09/12/2015  . H/O acute myocardial infarction 12/14/2014  . COPD, moderate (HCC) 12/14/2014  . Right shoulder: S/P Rotator cuff repair 11/15/2014  . Bladder cystocele 07/29/2014    Past Surgical History:  Procedure Laterality Date  . ABDOMINAL HYSTERECTOMY    . KNEE ARTHROPLASTY Left   . left elbow surgery    . LUMBAR DISC SURGERY    . ROTATOR CUFF REPAIR Right   . TRIGGER FINGER RELEASE Left   . ureter mesh      Prior to Admission medications   Medication Sig Start Date End Date Taking? Authorizing Provider  ADVAIR DISKUS 500-50 MCG/DOSE AEPB  08/13/16   [provider]  albuterol (PROVENTIL) (2.5 MG/3ML) 0.083% nebulizer solution as needed.  12/28/14   [provider]  albuterol-ipratropium (COMBIVENT) 18-103 MCG/ACT inhaler Inhale 2 puffs into the lungs every 6 (six) hours as needed for wheezing or shortness of breath.    [provider]  aspirin EC 81 MG tablet Take 81 mg by mouth daily.    [provider]  bisoprolol-hydrochlorothiazide (ZIAC) 10-6.25 MG tablet Take 1 tablet by mouth daily.    [provider]  butalbital-acetaminophen-caffeine (FIORICET, ESGIC) 50-325-40 MG tablet Take 1 tablet by mouth every 4 (four) hours as needed for headache.    [provider]  Calcium Carbonate-Vit D-Min (CALCIUM 1200 PO) Take by mouth daily.    [provider]  cephALEXin (KEFLEX) 500 MG capsule Take 1 capsule (500 mg  total) by mouth 3 (three) times daily. 05/07/17 05/17/17  Orvil FeilWoods, Shaneequa Bahner M, PA-C  Cholecalciferol (VITAMIN D) 2000 UNITS tablet Take 1 tablet (2,000 Units total) by mouth daily. 09/21/15   Delano MetzNaveira, Francisco, MD  Cinnamon ([facility] CINNAMON) 500 MG capsule Take 500 mg by mouth daily.    [provider]  COMBIVENT RESPIMAT 20-100 MCG/ACT AERS respimat 3 (three) times daily as needed.  06/05/16   [provider]  cyanocobalamin (,VITAMIN B-12,) 1000 MCG/ML injection Every 3 weeks 10/31/14   [provider]  cyclobenzaprine (FLEXERIL) 10 MG tablet Take 1 tablet (10 mg total) by mouth at bedtime. 10/15/16 11/14/16  Delano MetzNaveira, Francisco, MD  diazepam (VALIUM) 5 MG tablet Take 2.5 mg by mouth every 8 (eight) hours as needed for anxiety (takes 1/2 - 1 tablet as needed for anxiety).     [provider]  dicyclomine (BENTYL) 10 MG capsule take 1 capsule by mouth four times a day if needed for ABDOMINAL SPASM 10/28/15   [provider]  estradiol (CLIMARA - DOSED IN MG/24 HR) 0.1 mg/24hr patch Place 0.1 mg onto the skin once a week. On Wednesday    [provider]  Fluticasone-Salmeterol (ADVAIR) 100-50 MCG/DOSE AEPB Inhale 1 puff into the lungs 2 (two) times daily.    [provider]  hydrochlorothiazide (HYDRODIURIL) 12.5 MG tablet take 1 tablet by mouth once daily if needed for  edema 06/14/16   [provider]  lidocaine (LIDODERM) 5 % Place 1 patch onto the skin daily. Remove & Discard patch within 12 hours or as directed by MD 12/12/16 12/12/17  Enid Derry, PA-C  Magnesium 400 MG TABS Take 1 tablet by mouth 2 (two) times daily.     [provider]  magnesium oxide (MAG-OX) 400 MG tablet Take 1 tablet by mouth 2 (two) times daily. 10/10/16   [provider]  metoprolol tartrate (LOPRESSOR) 25 MG tablet 25 mg 2 (two) times daily.  07/28/16   [provider]  nitroGLYCERIN (NITROSTAT) 0.4 MG SL tablet Place 0.4 mg under the  tongue every 5 (five) minutes as needed for chest pain.    [provider]  ondansetron (ZOFRAN) 4 MG tablet Take 1 tablet (4 mg total) by mouth every 8 (eight) hours as needed for nausea or vomiting. 05/07/17 05/10/17  Orvil Feil, PA-C  oxyCODONE (OXY IR/ROXICODONE) 5 MG immediate release tablet Take 1 tablet (5 mg total) by mouth 3 (three) times daily. Max: 3/day 11/03/16 11/10/16  Delano Metz, MD  oxyCODONE (OXY IR/ROXICODONE) 5 MG immediate release tablet Take 1 tablet (5 mg total) by mouth 2 (two) times daily. Max: 2/day 11/10/16 11/17/16  Delano Metz, MD  oxyCODONE (OXY IR/ROXICODONE) 5 MG immediate release tablet Take 1 tablet (5 mg total) by mouth daily. Max: 1/day 11/17/16 11/24/16  Delano Metz, MD  oxyCODONE (OXY IR/ROXICODONE) 5 MG immediate release tablet Take 1 tablet (5 mg total) by mouth 4 (four) times daily. Max: 4/day 10/27/16 11/03/16  Delano Metz, MD  PARoxetine (PAXIL) 30 MG tablet Take 30 mg by mouth daily.    [provider]  Potassium 95 MG TABS Take 1 tablet by mouth daily.    [provider]  potassium chloride (K-DUR,KLOR-CON) 10 MEQ tablet  10/10/16   [provider]  promethazine (PHENERGAN) 25 MG tablet Take 25 mg by mouth every 6 (six) hours as needed for nausea or vomiting.    [provider]  promethazine-dextromethorphan (PROMETHAZINE-DM) 6.25-15 MG/5ML syrup take 5 milliliters by mouth every 4 hours if needed for cough 08/23/16   [provider]  rosuvastatin (CRESTOR) 20 MG tablet Take 20 mg by mouth at bedtime.     [provider]  traMADol (ULTRAM) 50 MG tablet Take 1 tablet (50 mg total) by mouth every 12 (twelve) hours as needed. 05/07/17 05/10/17  Orvil Feil, PA-C  vitamin E 1000 UNIT capsule Take 1,000 Units by mouth daily.    [provider]    Allergies Cymbalta [duloxetine hcl]; Duloxetine; Flagyl [metronidazole]; Lyrica [pregabalin]; Vicodin  [hydrocodone-acetaminophen]; Nsaids; and Tape  Family History  Problem Relation Age of Onset  . Breast cancer Cousin   . Breast cancer Maternal Aunt     Social History Social History  Substance Use Topics  . Smoking status: Current Every Day Smoker    Packs/day: 0.25    Types: Cigarettes  . Smokeless tobacco: Never Used  . Alcohol use No     Review of Systems  Constitutional: No fever/chills Eyes: No visual changes. No discharge ENT: No upper respiratory complaints. Cardiovascular: no chest pain. Respiratory: no cough. No SOB. Musculoskeletal: Patient has left index finger pain.  Skin: patient has 2 lacerations of the left index finger. Neurological: Negative for headaches, focal weakness or numbness.  ____________________________________________   PHYSICAL EXAM:  VITAL SIGNS: ED Triage Vitals  Enc Vitals Group     BP 05/07/17 1424 123/80  Pulse Rate 05/07/17 1424 94     Resp 05/07/17 1424 18     Temp 05/07/17 1424 97.6 F (36.4 C)     Temp Source 05/07/17 1424 Oral     SpO2 05/07/17 1424 97 %     Weight 05/07/17 1425 114 lb (51.7 kg)     Height 05/07/17 1425 4\' 11"  (1.499 m)     Head Circumference --      Peak Flow --      Pain Score 05/07/17 1424 8     Pain Loc --      Pain Edu? --      Excl. in GC? --      Constitutional: Alert and oriented. Well appearing and in no acute distress. Eyes: Conjunctivae are normal. PERRL. EOMI. Head: Atraumatic. Cardiovascular: Normal rate, regular rhythm. Normal S1 and S2.  Good peripheral circulation. Respiratory: Normal respiratory effort without tachypnea or retractions. Lungs CTAB. Good air entry to the bases with no decreased or absent breath sounds. Musculoskeletal: patient has 5 out of 5 strength in upper extremities bilaterally. Patient is able to move all 5 left fingers. Patient is able to perform resisted flexion and extension at the DIP and PIP joint of the left index finger. Palpable radial and ulnar pulses  bilaterally and symmetrically.  Neurologic:  Normal speech and language. No gross focal neurologic deficits are appreciated.  Skin: Patient has a 1-1/2 cm laceration near the DIP joint of left index finger. Patient also has a 1 cm laceration between the PIP and the DIP joint of the left index finger. Psychiatric: Mood and affect are normal. Speech and behavior are normal. Patient exhibits appropriate insight and judgement.   ____________________________________________   LABS (all labs ordered are listed, but only abnormal results are displayed)  Labs Reviewed - No data to display ____________________________________________  EKG   ____________________________________________  RADIOLOGY Geraldo Pitter, personally viewed and evaluated these images (plain radiographs) as part of my medical decision making, as well as reviewing the written report by the radiologist.  Dg Finger Index Left  Result Date: 05/07/2017 CLINICAL DATA:  Blow to the left index finger with a laceration using a hedge trimmer today. Pain. Initial encounter. EXAM: LEFT INDEX FINGER 2+V COMPARISON:  None. FINDINGS: No acute bony or joint abnormality is identified. No radiopaque foreign body. Advanced for age osteoarthritis DIP joint is seen with marked joint space narrowing and bulky osteophytosis. IMPRESSION: No acute abnormality. Advanced for age osteoarthritis left index finger. Electronically Signed   By: Drusilla Kanner M.D.   On: 05/07/2017 16:19    ____________________________________________    PROCEDURES  Procedure(s) performed:    Procedures LACERATION REPAIR Performed by: Orvil Feil Authorized by: Orvil Feil Consent: Verbal consent obtained. Risks and benefits: risks, benefits and alternatives were discussed Consent given by: patient Patient identity confirmed: provided demographic data Prepped and Draped in normal sterile fashion Wound explored  Laceration Location: left index  finger (near dip joint)  Laceration Length: 1.5 cm  No Foreign Bodies seen or palpated  Anesthesia: local infiltration  Local anesthetic: lidocaine 1% without epinephrine  Anesthetic total: 2 ml  Irrigation method: syringe Amount of cleaning: standard  Skin closure:  5-0 Ethilon  Number of sutures: 3  Technique: Simple Interrupted   Patient tolerance: Patient tolerated the procedure well with no immediate complications.  Procedures LACERATION REPAIR Performed by: Orvil Feil Authorized by: Orvil Feil Consent: Verbal consent obtained. Risks and benefits: risks, benefits and alternatives  were discussed Consent given by: patient Patient identity confirmed: provided demographic data Prepped and Draped in normal sterile fashion Wound explored  Laceration Location: left index finger (near dip joint)  Laceration Length: 1 cm  No Foreign Bodies seen or palpated  Anesthesia: local infiltration  Local anesthetic: lidocaine 1% without epinephrine  Anesthetic total: 2 ml  Irrigation method: syringe Amount of cleaning: standard  Skin closure:  5-0 Ethilon  Number of sutures: 1  Technique: Simple Interrupted     Medications  lidocaine (XYLOCAINE) 1 % (with pres) injection 5 mL (not administered)     ____________________________________________   INITIAL IMPRESSION / ASSESSMENT AND PLAN / ED COURSE  Pertinent labs & imaging results that were available during my care of the patient were reviewed by me and considered in my medical decision making (see chart for details).  Review of the [place] CSRS was performed in accordance of the [facility] prior to dispensing any controlled drugs.     Assessment and plan: Left index finger laceration: Patient presents to the emergency department with 2 lacerations of the left index finger. Patient underwent laceration repair without complication. Patient was discharged with Keflex and tramadol for pain. Patient was advised  to have sutures removed by primary care in 8 days. Patient voiced understanding regarding this recommendation. Vital signs are reassuring prior discharge. All patient questions were answered.     ____________________________________________  FINAL CLINICAL IMPRESSION(S) / ED DIAGNOSES  Final diagnoses:  Laceration of left index finger without foreign body without damage to nail, initial encounter      NEW MEDICATIONS STARTED DURING THIS VISIT:  New Prescriptions   CEPHALEXIN (KEFLEX) 500 MG CAPSULE    Take 1 capsule (500 mg total) by mouth 3 (three) times daily.   ONDANSETRON (ZOFRAN) 4 MG TABLET    Take 1 tablet (4 mg total) by mouth every 8 (eight) hours as needed for nausea or vomiting.   TRAMADOL (ULTRAM) 50 MG TABLET    Take 1 tablet (50 mg total) by mouth every 12 (twelve) hours as needed.        This chart was dictated using voice recognition software/Dragon. Despite best efforts to proofread, errors can occur which can change the meaning. Any change was purely unintentional.    Orvil Feil, PA-C 05/07/17 1646    Willy Eddy, MD 05/07/17 [phone]

## 2017-05-07 NOTE — ED Triage Notes (Signed)
Pt reports she cut her left hand index finger with hedge trimmer. Unknown last tetanus

## 2017-05-07 NOTE — ED Notes (Signed)
Pt presents with laceration to left index finger by hedge trimmer. Pt states that it is still bleeding. Pt alert & oriented with NAD noted.

## 2017-07-08 DIAGNOSIS — Z1211 Encounter for screening for malignant neoplasm of colon: Secondary | ICD-10-CM | POA: Insufficient documentation

## 2017-07-30 ENCOUNTER — Other Ambulatory Visit: Payer: Self-pay | Admitting: Family Medicine

## 2017-07-30 DIAGNOSIS — E049 Nontoxic goiter, unspecified: Secondary | ICD-10-CM

## 2017-08-02 ENCOUNTER — Emergency Department: Payer: Medicare Other

## 2017-08-02 ENCOUNTER — Encounter: Payer: Self-pay | Admitting: Emergency Medicine

## 2017-08-02 DIAGNOSIS — M25511 Pain in right shoulder: Secondary | ICD-10-CM | POA: Diagnosis present

## 2017-08-02 DIAGNOSIS — I10 Essential (primary) hypertension: Secondary | ICD-10-CM | POA: Diagnosis not present

## 2017-08-02 DIAGNOSIS — W182XXA Fall in (into) shower or empty bathtub, initial encounter: Secondary | ICD-10-CM | POA: Insufficient documentation

## 2017-08-02 DIAGNOSIS — Y999 Unspecified external cause status: Secondary | ICD-10-CM | POA: Insufficient documentation

## 2017-08-02 DIAGNOSIS — J45909 Unspecified asthma, uncomplicated: Secondary | ICD-10-CM | POA: Diagnosis not present

## 2017-08-02 DIAGNOSIS — Y939 Activity, unspecified: Secondary | ICD-10-CM | POA: Insufficient documentation

## 2017-08-02 DIAGNOSIS — I259 Chronic ischemic heart disease, unspecified: Secondary | ICD-10-CM | POA: Diagnosis not present

## 2017-08-02 DIAGNOSIS — Y929 Unspecified place or not applicable: Secondary | ICD-10-CM | POA: Diagnosis not present

## 2017-08-02 DIAGNOSIS — Z79899 Other long term (current) drug therapy: Secondary | ICD-10-CM | POA: Insufficient documentation

## 2017-08-02 DIAGNOSIS — F17210 Nicotine dependence, cigarettes, uncomplicated: Secondary | ICD-10-CM | POA: Insufficient documentation

## 2017-08-02 NOTE — ED Triage Notes (Signed)
Pt says she slipped tonight on the wet floor after getting out of the shower; c/o right shoulder pain; history of surgery x 2 to same; pain increases with any movement; pt with tenderness to top of right shoulder and right upper arm;

## 2017-08-03 ENCOUNTER — Emergency Department
Admission: EM | Admit: 2017-08-03 | Discharge: 2017-08-03 | Disposition: A | Discharge status: Home / Self Care | Payer: Medicare Other | Attending: Emergency Medicine | Admitting: Emergency Medicine

## 2017-08-03 DIAGNOSIS — M25511 Pain in right shoulder: Secondary | ICD-10-CM

## 2017-08-03 DIAGNOSIS — W19XXXA Unspecified fall, initial encounter: Secondary | ICD-10-CM

## 2017-08-03 MED ORDER — ACETAMINOPHEN-CODEINE #3 300-30 MG PO TABS
1.0000 | ORAL_TABLET | Freq: Once | ORAL | Status: AC
  Administered 2017-08-03: 1 via ORAL
  Filled 2017-08-03: qty 1

## 2017-08-03 NOTE — Discharge Instructions (Signed)
Wear sling as needed for comfort. Use your pain relieversas needed. Return to the ER for worsening symptoms, persistent vomiting, difficulty breathing or other concerns.

## 2017-08-03 NOTE — ED Provider Notes (Signed)
[facility] Emergency Department Provider Note   ____________________________________________   First MD Initiated Contact with Patient 08/03/17 0236     (approximate)  I have reviewed the triage vital signs and the nursing notes.   HISTORY  Chief Complaint Fall    HPI Michelle Newton is a 56 y.o. female who presents to the ED from home with a chief complaint of fall with right shoulder pain. Patient has chronic bilateral shoulder pain; status post right shoulder surgery 2. Follows with [facility]. Was in pain management in [place] until approximately one year ago. Had 1 appointment with [facility] pain [facility] and declined to go back because she did not like their recommendations. States she slipped on the wet floor after her shower this morning and fell, striking her right shoulder. Denies striking head or LOC. Denies neck pain, chest pain, shortness of breath, abdominal pain, nausea, vomiting.   Past Medical History:  Diagnosis Date  . Anxiety   . Asthma   . CAD (coronary artery disease)   . Chronic fatigue   . Chronic thumb pain, bilateral 09/12/2015  . COPD (chronic obstructive pulmonary disease) (HCC)   . Coronary artery disease   . Depression   . DJD (degenerative joint disease)   . DJD (degenerative joint disease)   . Essential hypertension 09/12/2015  . Family history of chronic pain 09/26/2015  . Fibromyalgia   . Fibromyalgia   . Fracture five ribs-closed 09/12/2015  . H/O acute myocardial infarction 12/14/2014  . H/O non-insulin dependent diabetes mellitus 09/12/2015  . History of cardiac arrhythmia 09/12/2015  . History of elbow surgery 09/26/2015   Left ulnar nerve transposition  . History of knee surgery 09/26/2015  . History of migraine 09/12/2015  . History of seizures 09/12/2015  . Hypercholesteremia   . MI (myocardial infarction) (HCC)   . Migraine   . Migraine   . Osteoarthritis   . Right shoulder: Acute Pain due to  trauma 04/18/2016  . Right shoulder: Chronic Pain 09/12/2015  . Seizures (HCC)   . Ulcerative (chronic) enterocolitis (HCC)   . Vertigo     Patient Active Problem List   Diagnosis Date Noted  . High risk for falls 10/15/2016  . Long term prescription benzodiazepine use 09/17/2016  . Chronic pain of shoulders (Location of Primary Source of Pain) (Bilateral) ((R>L) 07/23/2016  . Disturbance of skin sensation 06/05/2016  . S/P shoulder surgery (05/24/2016) (Right) 06/05/2016  . Acromial fracture 05/09/2016  . Abnormal MRI, shoulder (Right) 04/18/2016  . Right shoulder: Adhesive capsulitis 04/18/2016  . Right shoulder: Torn & retracted Infraspinatus tendon 04/18/2016  . Right shoulder: Torn & retracted Supraspinatus tendon (Complete, posterior full-thickness tear) 04/18/2016  . Right shoulder: Subscapularis tendinopathy 04/18/2016  . Right shoulder: Biceps tendinopathy 04/18/2016  . Right shoulder: Avascular necrosis of humeral head (HCC) 04/18/2016  . Right shoulder: Subacromial/Subdeltoid bursitis 04/18/2016  . Right shoulder:  Rotator cuff Disorder 02/22/2016  . Long term current use of opiate analgesic 11/13/2015  . Long term prescription opiate use 11/13/2015  . Opioid dependence, daily use (HCC) 11/13/2015  . Chronic cervical radicular pain (Left) 09/26/2015  . Chronic fatigue syndrome 09/26/2015  . Family history of chronic pain 09/26/2015  . History of knee surgery 09/26/2015  . History of elbow surgery 09/26/2015  . Vitamin D insufficiency 09/21/2015  . Encounter for therapeutic drug level monitoring 09/12/2015  . Encounter for long-term use of opiate analgesic 09/12/2015  . Opiate use (37.5 MME/Day) 09/12/2015  . Cervical  facet syndrome (L) 09/12/2015  . Cervical spondylosis 09/12/2015  . Chronic neck pain (Location of Secondary source of pain) (Bilateral) (L>R) 09/12/2015  . Chronic hip pain (Left) 09/12/2015  . Lumbar facet syndrome 09/12/2015  . Lumbar spondylosis  09/12/2015  . 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 Chronic low back pain 09/12/2015  . Fibromyalgia 09/12/2015  . Chronic pain syndrome 09/12/2015  . Chronic thumb pain (Bilateral) 09/12/2015  . Upper extremity pain 09/12/2015  . Occipital neuralgia (Bilateral) 09/12/2015  . Smoker 09/12/2015  . Generalized anxiety disorder 09/12/2015  . Depression 09/12/2015  . Wrist arthritis 09/12/2015  . Fracture five ribs-closed 09/12/2015  . History of seizures 09/12/2015  . History of migraine 09/12/2015  . H/O non-insulin dependent diabetes mellitus 09/12/2015  . Essential hypertension 09/12/2015  . High cholesterol 09/12/2015  . Coronary artery disease 09/12/2015  . History of cardiac arrhythmia 09/12/2015  . H/O vertigo 09/12/2015  . H/O acute myocardial infarction 12/14/2014  . COPD, moderate (HCC) 12/14/2014  . Right shoulder: S/P Rotator cuff repair 11/15/2014  . Bladder cystocele 07/29/2014    Past Surgical History:  Procedure Laterality Date  . ABDOMINAL HYSTERECTOMY    . BACK SURGERY    . KNEE ARTHROPLASTY Left   . left elbow surgery    . LUMBAR DISC SURGERY    . ROTATOR CUFF REPAIR Right   . TRIGGER FINGER RELEASE Left   . ureter mesh      Prior to Admission medications   Medication Sig Start Date End Date Taking? Authorizing Provider  ADVAIR DISKUS 500-50 MCG/DOSE AEPB  08/13/16   [provider]  albuterol (PROVENTIL) (2.5 MG/3ML) 0.083% nebulizer solution as needed.  12/28/14   [provider]  albuterol-ipratropium (COMBIVENT) 18-103 MCG/ACT inhaler Inhale 2 puffs into the lungs every 6 (six) hours as needed for wheezing or shortness of breath.    [provider]  aspirin EC 81 MG tablet Take 81 mg by mouth daily.    [provider]  bisoprolol-hydrochlorothiazide (ZIAC) 10-6.25 MG tablet Take 1 tablet by mouth daily.    [provider]  butalbital-acetaminophen-caffeine (FIORICET, ESGIC) 50-325-40 MG tablet Take 1  tablet by mouth every 4 (four) hours as needed for headache.    [provider]  Calcium Carbonate-Vit D-Min (CALCIUM 1200 PO) Take by mouth daily.    [provider]  Cholecalciferol (VITAMIN D) 2000 UNITS tablet Take 1 tablet (2,000 Units total) by mouth daily. 09/21/15   Delano MetzNaveira, Francisco, MD  Cinnamon ([employer] CINNAMON) 500 MG capsule Take 500 mg by mouth daily.    [provider]  COMBIVENT RESPIMAT 20-100 MCG/ACT AERS respimat 3 (three) times daily as needed.  06/05/16   [provider]  cyanocobalamin (,VITAMIN B-12,) 1000 MCG/ML injection Every 3 weeks 10/31/14   [provider]  cyclobenzaprine (FLEXERIL) 10 MG tablet Take 1 tablet (10 mg total) by mouth at bedtime. 10/15/16 11/14/16  Delano MetzNaveira, Francisco, MD  diazepam (VALIUM) 5 MG tablet Take 2.5 mg by mouth every 8 (eight) hours as needed for anxiety (takes 1/2 - 1 tablet as needed for anxiety).     [provider]  dicyclomine (BENTYL) 10 MG capsule take 1 capsule by mouth four times a day if needed for ABDOMINAL SPASM 10/28/15   [provider]  estradiol (CLIMARA - DOSED IN MG/24 HR) 0.1 mg/24hr patch Place 0.1 mg onto the skin once a week. On Wednesday    [provider]  Fluticasone-Salmeterol (ADVAIR) 100-50 MCG/DOSE AEPB Inhale  1 puff into the lungs 2 (two) times daily.    [provider]  hydrochlorothiazide (HYDRODIURIL) 12.5 MG tablet take 1 tablet by mouth once daily if needed for edema 06/14/16   [provider]  lidocaine (LIDODERM) 5 % Place 1 patch onto the skin daily. Remove & Discard patch within 12 hours or as directed by MD 12/12/16 12/12/17  Enid Derry, PA-C  Magnesium 400 MG TABS Take 1 tablet by mouth 2 (two) times daily.     [provider]  magnesium oxide (MAG-OX) 400 MG tablet Take 1 tablet by mouth 2 (two) times daily. 10/10/16   [provider]  metoprolol tartrate (LOPRESSOR) 25 MG tablet 25 mg 2 (two) times  daily.  07/28/16   [provider]  nitroGLYCERIN (NITROSTAT) 0.4 MG SL tablet Place 0.4 mg under the tongue every 5 (five) minutes as needed for chest pain.    [provider]  oxyCODONE (OXY IR/ROXICODONE) 5 MG immediate release tablet Take 1 tablet (5 mg total) by mouth 3 (three) times daily. Max: 3/day 11/03/16 11/10/16  Delano Metz, MD  oxyCODONE (OXY IR/ROXICODONE) 5 MG immediate release tablet Take 1 tablet (5 mg total) by mouth 2 (two) times daily. Max: 2/day 11/10/16 11/17/16  Delano Metz, MD  oxyCODONE (OXY IR/ROXICODONE) 5 MG immediate release tablet Take 1 tablet (5 mg total) by mouth daily. Max: 1/day 11/17/16 11/24/16  Delano Metz, MD  oxyCODONE (OXY IR/ROXICODONE) 5 MG immediate release tablet Take 1 tablet (5 mg total) by mouth 4 (four) times daily. Max: 4/day 10/27/16 11/03/16  Delano Metz, MD  PARoxetine (PAXIL) 30 MG tablet Take 30 mg by mouth daily.    [provider]  Potassium 95 MG TABS Take 1 tablet by mouth daily.    [provider]  potassium chloride (K-DUR,KLOR-CON) 10 MEQ tablet  10/10/16   [provider]  promethazine (PHENERGAN) 25 MG tablet Take 25 mg by mouth every 6 (six) hours as needed for nausea or vomiting.    [provider]  promethazine-dextromethorphan (PROMETHAZINE-DM) 6.25-15 MG/5ML syrup take 5 milliliters by mouth every 4 hours if needed for cough 08/23/16   [provider]  rosuvastatin (CRESTOR) 20 MG tablet Take 20 mg by mouth at bedtime.     [provider]  vitamin E 1000 UNIT capsule Take 1,000 Units by mouth daily.    [provider]    Allergies Cymbalta [duloxetine hcl]; Duloxetine; Flagyl [metronidazole]; Lyrica [pregabalin]; Vicodin [hydrocodone-acetaminophen]; Nsaids; and Tape  Family History  Problem Relation Age of Onset  . Breast cancer Cousin   . Breast cancer Maternal Aunt     Social History Social History  Substance Use  Topics  . Smoking status: Current Every Day Smoker    Packs/day: 0.25    Types: Cigarettes  . Smokeless tobacco: Never Used  . Alcohol use No    Review of Systems  Constitutional: No fever/chills. Eyes: No visual changes. ENT: No sore throat. Cardiovascular: Denies chest pain. Respiratory: Denies shortness of breath. Gastrointestinal: No abdominal pain.  No nausea, no vomiting.  No diarrhea.  No constipation. Genitourinary: Negative for dysuria. Musculoskeletal: positive for right shoulder pain. Negative for back pain. Skin: Negative for rash. Neurological: Negative for headaches, focal weakness or numbness.   ____________________________________________   PHYSICAL EXAM:  VITAL SIGNS: ED Triage Vitals [08/02/17 2319]  Enc Vitals Group     BP 121/70     Pulse Rate 61     Resp 18  Temp 98.7 F (37.1 C)     Temp Source Oral     SpO2 100 %     Weight 115 lb (52.2 kg)     Height 5' (1.524 m)     Head Circumference      Peak Flow      Pain Score 8     Pain Loc      Pain Edu?      Excl. in GC?     Constitutional: Alert and oriented. Well appearing and in no acute distress. Eyes: Conjunctivae are normal. PERRL. EOMI. Head: Atraumatic. Nose: No congestion/rhinnorhea. Mouth/Throat: Mucous membranes are moist.  Oropharynx non-erythematous. Neck: No stridor.  No cervical spine tenderness to palpation. Cardiovascular: Normal rate, regular rhythm. Grossly normal heart sounds.  Good peripheral circulation. Respiratory: Normal respiratory effort.  No retractions. Lungs CTAB. Gastrointestinal: Soft and nontender. No distention. No abdominal bruits. No CVA tenderness. Musculoskeletal:  Right shoulder tender to palpation. Limited range of motion secondary to pain. 2+ radial pulses. Brisk, less than 5 second capillary refill. Neurologic:  Normal speech and language. No gross focal neurologic deficits are appreciated. No gait instability. Skin:  Skin is warm, dry and intact.  No rash noted. Psychiatric: Mood and affect are normal. Speech and behavior are normal.  ____________________________________________   LABS (all labs ordered are listed, but only abnormal results are displayed)  Labs Reviewed - No data to display ____________________________________________  EKG  none ____________________________________________  RADIOLOGY  Dg Shoulder Right  Result Date: 08/03/2017 CLINICAL DATA:  Slip and fall injury on a wet floor at home tonight. EXAM: RIGHT SHOULDER - 2+ VIEW COMPARISON:  Multiple prior radiographs dating back to 10/06/2014 FINDINGS: Marked irregularity of the acromion and AC joint. Most of this is chronic, with osteolysis and prior surgery. Cannot exclude an acute fracture due to the severity of the chronic changes. The proximal humerus is intact. The glenohumeral joint is significantly degenerated with superior migration of the humeral head, consistent with chronic rotator cuff tear. IMPRESSION: Severe chronic changes at the shoulder and AC joint. Cannot entire exclude an acute fracture of the acromion although the irregularity is more likely chronic. Electronically Signed   By: Ellery Plunk M.D.   On: 08/03/2017 00:07   Dg Humerus Right  Result Date: 08/03/2017 CLINICAL DATA:  Slip and fall injury on a wet floor at home tonight. EXAM: RIGHT HUMERUS - 2+ VIEW COMPARISON:  Multiple prior studies dating back to 10/06/2014 FINDINGS: The humerus is intact. Severe chronic changes at the shoulder consistent with rotator cuff arthropathy. Severe chronic ostial ice is and irregularities of the acromion and distal clavicle. IMPRESSION: Negative for acute fracture. Electronically Signed   By: Ellery Plunk M.D.   On: 08/03/2017 00:07    ____________________________________________   PROCEDURES  Procedure(s) performed: None  Procedures  Critical Care performed: No  ____________________________________________   INITIAL IMPRESSION /  ASSESSMENT AND PLAN / ED COURSE  Pertinent labs & imaging results that were available during my care of the patient were reviewed by me and considered in my medical decision making (see chart for details).  56 year old female who presents with acute on chronic right shoulder pain status post mechanical fall. X-ray imaging studies most likely indicative of chronic changes.  I reviewed the patient's prescription history over the last 12 months in the multi-state controlled substances database(s) that includes [place], [place], [place], [place], [place], [place], [place], [place], [place], [place], [place], [place], [place], and [place].  Results were  notable for #30 diazepam 5mg  dispensed 07/16/2017, #20 tylenol 3 dispensed 07/04/2017, both dispense monthly by patient's PCP.  We discussed patient will not receive a prescription for pain medicines. She will receive a one-time dose of Tylenol No. 3 in the emergency department. She was strongly encouraged to follow up with her PCP and orthopedist. Strict return precautions given. Patient verbalizes understanding and agrees with plan of care.      ____________________________________________   FINAL CLINICAL IMPRESSION(S) / ED DIAGNOSES  Final diagnoses:  Fall, initial encounter  Acute pain of right shoulder      NEW MEDICATIONS STARTED DURING THIS VISIT:  New Prescriptions   No medications on file     Note:  This document was prepared using Dragon voice recognition software and may include unintentional dictation errors.    Irean Hong, MD 08/03/17 [id number]

## 2017-08-07 ENCOUNTER — Ambulatory Visit: Payer: Medicare Other

## 2017-08-07 ENCOUNTER — Other Ambulatory Visit: Payer: Self-pay | Admitting: Family Medicine

## 2017-08-07 DIAGNOSIS — M542 Cervicalgia: Secondary | ICD-10-CM

## 2017-08-12 ENCOUNTER — Ambulatory Visit: Admission: RE | Admit: 2017-08-12 | Payer: Medicare Other | Source: Ambulatory Visit

## 2017-08-14 ENCOUNTER — Ambulatory Visit: Payer: Medicare Other

## 2017-08-20 ENCOUNTER — Ambulatory Visit
Admission: RE | Admit: 2017-08-20 | Discharge: 2017-08-20 | Disposition: A | Discharge status: Home / Self Care | Payer: Medicare Other | Source: Ambulatory Visit | Attending: Family Medicine | Admitting: Family Medicine

## 2017-08-20 DIAGNOSIS — M47812 Spondylosis without myelopathy or radiculopathy, cervical region: Secondary | ICD-10-CM | POA: Diagnosis not present

## 2017-08-20 DIAGNOSIS — M542 Cervicalgia: Secondary | ICD-10-CM

## 2017-08-21 ENCOUNTER — Ambulatory Visit
Admission: RE | Admit: 2017-08-21 | Discharge: 2017-08-21 | Disposition: A | Discharge status: Home / Self Care | Payer: Medicare Other | Source: Ambulatory Visit | Attending: Family Medicine | Admitting: Family Medicine

## 2017-08-21 DIAGNOSIS — E049 Nontoxic goiter, unspecified: Secondary | ICD-10-CM | POA: Insufficient documentation

## 2017-09-03 ENCOUNTER — Other Ambulatory Visit: Payer: Self-pay | Admitting: Family Medicine

## 2017-09-03 DIAGNOSIS — Z780 Asymptomatic menopausal state: Secondary | ICD-10-CM

## 2017-09-03 DIAGNOSIS — Z8781 Personal history of (healed) traumatic fracture: Secondary | ICD-10-CM

## 2017-09-16 ENCOUNTER — Other Ambulatory Visit: Payer: Medicare Other

## 2018-09-18 ENCOUNTER — Other Ambulatory Visit: Payer: Self-pay

## 2018-09-18 ENCOUNTER — Encounter: Payer: Self-pay | Admitting: Emergency Medicine

## 2018-09-18 ENCOUNTER — Emergency Department
Admission: EM | Admit: 2018-09-18 | Discharge: 2018-09-18 | Disposition: A | Discharge status: Home / Self Care | Payer: Medicare Other | Attending: Emergency Medicine | Admitting: Emergency Medicine

## 2018-09-18 DIAGNOSIS — M25511 Pain in right shoulder: Secondary | ICD-10-CM | POA: Diagnosis not present

## 2018-09-18 DIAGNOSIS — M25512 Pain in left shoulder: Secondary | ICD-10-CM | POA: Insufficient documentation

## 2018-09-18 DIAGNOSIS — Z5321 Procedure and treatment not carried out due to patient leaving prior to being seen by health care provider: Secondary | ICD-10-CM | POA: Diagnosis not present

## 2018-09-18 NOTE — ED Triage Notes (Signed)
Pt here with c/o bilateral shoulder pain-constant for years now, woke her up at 3am with increased pain, has had shoulder surgeries, has taken tylenol #3 with no relief. Appears in NAD.

## 2018-10-22 ENCOUNTER — Other Ambulatory Visit: Payer: Self-pay | Admitting: Otolaryngology

## 2018-10-22 DIAGNOSIS — R42 Dizziness and giddiness: Secondary | ICD-10-CM

## 2018-11-12 ENCOUNTER — Ambulatory Visit
Admission: RE | Admit: 2018-11-12 | Discharge: 2018-11-12 | Disposition: A | Discharge status: Home / Self Care | Payer: Medicare Other | Source: Ambulatory Visit | Attending: Otolaryngology | Admitting: Otolaryngology

## 2018-11-12 DIAGNOSIS — R42 Dizziness and giddiness: Secondary | ICD-10-CM | POA: Diagnosis present

## 2018-11-12 DIAGNOSIS — I6523 Occlusion and stenosis of bilateral carotid arteries: Secondary | ICD-10-CM | POA: Diagnosis not present

## 2018-11-12 MED ORDER — GADOBUTROL 1 MMOL/ML IV SOLN
5.0000 mL | Freq: Once | INTRAVENOUS | Status: AC | PRN
  Administered 2018-11-12: 5 mL via INTRAVENOUS

## 2018-11-13 ENCOUNTER — Other Ambulatory Visit: Payer: Self-pay | Admitting: Family Medicine

## 2018-11-13 DIAGNOSIS — Z1231 Encounter for screening mammogram for malignant neoplasm of breast: Secondary | ICD-10-CM

## 2018-11-13 LAB — POCT I-STAT CREATININE: CREATININE: 0.9 mg/dL (ref 0.44–1.00)

## 2018-12-10 ENCOUNTER — Ambulatory Visit
Admission: RE | Admit: 2018-12-10 | Discharge: 2018-12-10 | Disposition: A | Discharge status: Home / Self Care | Payer: Medicare Other | Source: Ambulatory Visit | Attending: Family Medicine | Admitting: Family Medicine

## 2018-12-10 DIAGNOSIS — Z1231 Encounter for screening mammogram for malignant neoplasm of breast: Secondary | ICD-10-CM | POA: Diagnosis not present

## 2018-12-14 ENCOUNTER — Other Ambulatory Visit: Payer: Self-pay | Admitting: Family Medicine

## 2018-12-14 DIAGNOSIS — N6489 Other specified disorders of breast: Secondary | ICD-10-CM

## 2018-12-14 DIAGNOSIS — R928 Other abnormal and inconclusive findings on diagnostic imaging of breast: Secondary | ICD-10-CM

## 2018-12-21 ENCOUNTER — Ambulatory Visit: Payer: Medicare Other

## 2018-12-21 ENCOUNTER — Other Ambulatory Visit: Payer: Medicare Other

## 2018-12-25 ENCOUNTER — Ambulatory Visit
Admission: RE | Admit: 2018-12-25 | Discharge: 2018-12-25 | Disposition: A | Discharge status: Home / Self Care | Payer: Medicare Other | Source: Ambulatory Visit | Attending: Family Medicine | Admitting: Family Medicine

## 2018-12-25 DIAGNOSIS — N6489 Other specified disorders of breast: Secondary | ICD-10-CM

## 2018-12-25 DIAGNOSIS — R928 Other abnormal and inconclusive findings on diagnostic imaging of breast: Secondary | ICD-10-CM

## 2018-12-30 ENCOUNTER — Other Ambulatory Visit: Payer: Self-pay | Admitting: Family Medicine

## 2018-12-30 DIAGNOSIS — N6489 Other specified disorders of breast: Secondary | ICD-10-CM

## 2019-08-17 ENCOUNTER — Other Ambulatory Visit: Payer: Medicare Other

## 2019-10-24 ENCOUNTER — Emergency Department
Admission: EM | Admit: 2019-10-24 | Discharge: 2019-10-24 | Discharge status: Against Medical Advice | Payer: Medicare Other | Source: Home / Self Care

## 2019-10-24 ENCOUNTER — Emergency Department
Admission: EM | Admit: 2019-10-24 | Discharge: 2019-10-24 | Disposition: A | Discharge status: Home / Self Care | Payer: Medicare Other | Attending: Emergency Medicine | Admitting: Emergency Medicine

## 2019-10-24 ENCOUNTER — Emergency Department: Payer: Medicare Other

## 2019-10-24 ENCOUNTER — Other Ambulatory Visit: Payer: Self-pay

## 2019-10-24 DIAGNOSIS — Z5321 Procedure and treatment not carried out due to patient leaving prior to being seen by health care provider: Secondary | ICD-10-CM | POA: Insufficient documentation

## 2019-10-24 DIAGNOSIS — M25511 Pain in right shoulder: Secondary | ICD-10-CM | POA: Insufficient documentation

## 2019-10-24 DIAGNOSIS — M25562 Pain in left knee: Secondary | ICD-10-CM | POA: Diagnosis present

## 2019-10-24 NOTE — ED Notes (Signed)
Patient's triage was completed by Rockwell [place]

## 2019-10-24 NOTE — ED Notes (Signed)
Patient yelling across the lobby asking for her xray results. Patient informed that the MD has to go over the xray results with the patient. Patient states that she is able to read xrays so that a nurse should be able to read them as well. Patient again informed that a MD has to go over the xray results with her. Patient states that if she is not seen in 30 - 45 minutes that she will be leaving.

## 2019-10-24 NOTE — ED Notes (Signed)
Patient to stat desk yelling to be taken back to see the MD to ger her xray results. Patient informed that there is one person ahead of her to be seen. Patient continues to yell and states that she doesn't give a shit and that she wants to be taken back now to see the dr that it only takes 5 minutes to read her xrays. If she does not go back now then she is leaving. Patient again informed that there is only one patient ahead of her that has been waiting longer. Patient continues to yell and say that she does not give a shit and that she is leaving.

## 2019-10-24 NOTE — ED Notes (Signed)
Patient transported to X-ray 

## 2019-10-24 NOTE — ED Triage Notes (Signed)
Patient states that she tripped down some stairs this morning. Patient with complaint of pain in left knee and right shoulder.

## 2019-10-24 NOTE — ED Notes (Signed)
Patient to stat desk in no acute distress asking about wait time. Patient given update on wait time.  

## 2019-12-03 HISTORY — PX: ABDOMINAL AORTA STENT: SHX1108

## 2020-01-04 DIAGNOSIS — I779 Disorder of arteries and arterioles, unspecified: Secondary | ICD-10-CM | POA: Insufficient documentation

## 2020-01-14 DIAGNOSIS — Z95828 Presence of other vascular implants and grafts: Secondary | ICD-10-CM | POA: Insufficient documentation

## 2020-03-09 ENCOUNTER — Ambulatory Visit: Payer: Medicare Other | Attending: Internal Medicine

## 2020-03-09 DIAGNOSIS — Z23 Encounter for immunization: Secondary | ICD-10-CM

## 2020-03-09 NOTE — Progress Notes (Signed)
   Covid-19 Vaccination Clinic  Name:  Michelle Newton    MRN: [id number] DOB: [birth year]  03/09/2020  [name] was observed post Covid-19 immunization for 30 minutes based on pre-vaccination screening without incident. She was provided with Vaccine Information Sheet and instruction to access the V-Safe system.   [name] was instructed to call 911 with any severe reactions post vaccine: Marland Kitchen Difficulty breathing  . Swelling of face and throat  . A fast heartbeat  . A bad rash all over body  . Dizziness and weakness   Immunizations Administered    Name Date Dose VIS Date Route   Pfizer COVID-19 Vaccine 03/09/2020  9:16 AM 0.3 mL 11/12/2019 Intramuscular   Manufacturer: [employer], [employer]   Lot: [id number]   NDC: [address]-1

## 2020-03-13 ENCOUNTER — Other Ambulatory Visit: Payer: Self-pay | Admitting: Family Medicine

## 2020-03-13 ENCOUNTER — Other Ambulatory Visit (HOSPITAL_COMMUNITY): Payer: Self-pay | Admitting: Family Medicine

## 2020-03-13 ENCOUNTER — Ambulatory Visit
Admission: RE | Admit: 2020-03-13 | Discharge: 2020-03-13 | Disposition: A | Discharge status: Home / Self Care | Payer: Medicare Other | Source: Ambulatory Visit | Attending: Family Medicine | Admitting: Family Medicine

## 2020-03-13 ENCOUNTER — Other Ambulatory Visit: Payer: Self-pay

## 2020-03-13 DIAGNOSIS — M7989 Other specified soft tissue disorders: Secondary | ICD-10-CM | POA: Insufficient documentation

## 2020-03-13 DIAGNOSIS — Z86718 Personal history of other venous thrombosis and embolism: Secondary | ICD-10-CM

## 2020-03-13 DIAGNOSIS — M79605 Pain in left leg: Secondary | ICD-10-CM | POA: Diagnosis present

## 2020-04-05 ENCOUNTER — Ambulatory Visit: Payer: Medicare Other | Attending: Internal Medicine

## 2020-04-05 DIAGNOSIS — Z23 Encounter for immunization: Secondary | ICD-10-CM

## 2020-04-05 NOTE — Progress Notes (Signed)
   Covid-19 Vaccination Clinic  Name:  ROMANDA TURRUBIATES    MRN: [id number] DOB: 25-May-1961  04/05/2020  Ms. Vaughan was observed post Covid-19 immunization for 24 without incident. Vaccine received at 9:09am.  She was provided with Vaccine Information Sheet and instruction to access the V-Safe system. She refused to wait her entire 30 minutes stated she didn't have any problems last time and has COPD and that was the reason for the 30 minute timeframe.   Ms. Cavenaugh was instructed to call 911 with any severe reactions post vaccine: Marland Kitchen Difficulty breathing  . Swelling of face and throat  . A fast heartbeat  . A bad rash all over body  . Dizziness and weakness   Immunizations Administered    Name Date Dose VIS Date Route   Pfizer COVID-19 Vaccine 04/05/2020  9:09 AM 0.3 mL 01/26/2019 Intramuscular   Manufacturer: [employer], [employer]   Lot: [id number]   NDC: [address]-0

## 2020-10-09 ENCOUNTER — Ambulatory Visit: Payer: Medicare Other | Attending: Internal Medicine

## 2020-10-09 DIAGNOSIS — Z23 Encounter for immunization: Secondary | ICD-10-CM

## 2020-10-09 NOTE — Progress Notes (Signed)
   Covid-19 Vaccination Clinic  Name:  Michelle Newton    MRN: [id number] DOB: 06/29/1961  10/09/2020  [name] was observed post Covid-19 immunization for 15 minutes without incident. She was provided with Vaccine Information Sheet and instruction to access the V-Safe system.   [name] was instructed to call 911 with any severe reactions post vaccine: Marland Kitchen Difficulty breathing  . Swelling of face and throat  . A fast heartbeat  . A bad rash all over body  . Dizziness and weakness

## 2020-12-11 ENCOUNTER — Other Ambulatory Visit: Payer: Self-pay | Admitting: Family Medicine

## 2020-12-11 DIAGNOSIS — R928 Other abnormal and inconclusive findings on diagnostic imaging of breast: Secondary | ICD-10-CM

## 2020-12-28 ENCOUNTER — Other Ambulatory Visit: Payer: Self-pay

## 2020-12-28 ENCOUNTER — Ambulatory Visit
Admission: RE | Admit: 2020-12-28 | Discharge: 2020-12-28 | Disposition: A | Discharge status: Home / Self Care | Payer: Medicare Other | Source: Ambulatory Visit | Attending: Family Medicine | Admitting: Family Medicine

## 2020-12-28 DIAGNOSIS — R928 Other abnormal and inconclusive findings on diagnostic imaging of breast: Secondary | ICD-10-CM | POA: Insufficient documentation

## 2021-03-30 DIAGNOSIS — R002 Palpitations: Secondary | ICD-10-CM | POA: Insufficient documentation

## 2021-03-30 DIAGNOSIS — R0602 Shortness of breath: Secondary | ICD-10-CM | POA: Insufficient documentation

## 2021-03-30 DIAGNOSIS — R079 Chest pain, unspecified: Secondary | ICD-10-CM | POA: Insufficient documentation

## 2021-04-03 ENCOUNTER — Other Ambulatory Visit: Payer: Self-pay

## 2021-04-03 ENCOUNTER — Other Ambulatory Visit: Payer: Self-pay | Admitting: Family Medicine

## 2021-04-03 ENCOUNTER — Ambulatory Visit
Admission: RE | Admit: 2021-04-03 | Discharge: 2021-04-03 | Disposition: A | Discharge status: Home / Self Care | Payer: Medicare Other | Source: Ambulatory Visit | Attending: Family Medicine | Admitting: Family Medicine

## 2021-04-03 DIAGNOSIS — M7989 Other specified soft tissue disorders: Secondary | ICD-10-CM | POA: Diagnosis present

## 2021-04-03 DIAGNOSIS — M79604 Pain in right leg: Secondary | ICD-10-CM | POA: Diagnosis present

## 2021-05-18 ENCOUNTER — Other Ambulatory Visit: Payer: Self-pay | Admitting: Family Medicine

## 2021-05-18 ENCOUNTER — Other Ambulatory Visit: Payer: Self-pay

## 2021-05-18 ENCOUNTER — Ambulatory Visit
Admission: RE | Admit: 2021-05-18 | Discharge: 2021-05-18 | Disposition: A | Discharge status: Home / Self Care | Payer: Medicare Other | Source: Ambulatory Visit | Attending: Family Medicine | Admitting: Family Medicine

## 2021-05-18 ENCOUNTER — Ambulatory Visit
Admission: RE | Admit: 2021-05-18 | Discharge: 2021-05-18 | Disposition: A | Discharge status: Home / Self Care | Payer: Medicare Other | Attending: Family Medicine | Admitting: Family Medicine

## 2021-05-18 DIAGNOSIS — M25572 Pain in left ankle and joints of left foot: Secondary | ICD-10-CM

## 2021-12-14 ENCOUNTER — Other Ambulatory Visit: Payer: Self-pay | Admitting: Family Medicine

## 2021-12-14 DIAGNOSIS — R1011 Right upper quadrant pain: Secondary | ICD-10-CM

## 2021-12-21 ENCOUNTER — Ambulatory Visit
Admission: RE | Admit: 2021-12-21 | Discharge: 2021-12-21 | Disposition: A | Discharge status: Home / Self Care | Payer: Medicare Other | Source: Ambulatory Visit | Attending: Family Medicine | Admitting: Family Medicine

## 2021-12-21 ENCOUNTER — Other Ambulatory Visit: Payer: Self-pay

## 2021-12-21 DIAGNOSIS — R1011 Right upper quadrant pain: Secondary | ICD-10-CM | POA: Diagnosis present

## 2022-02-06 DIAGNOSIS — S76011A Strain of muscle, fascia and tendon of right hip, initial encounter: Secondary | ICD-10-CM | POA: Insufficient documentation

## 2022-02-06 DIAGNOSIS — M1611 Unilateral primary osteoarthritis, right hip: Secondary | ICD-10-CM | POA: Insufficient documentation

## 2022-02-06 DIAGNOSIS — M25551 Pain in right hip: Secondary | ICD-10-CM | POA: Insufficient documentation

## 2022-02-06 DIAGNOSIS — M7061 Trochanteric bursitis, right hip: Secondary | ICD-10-CM | POA: Insufficient documentation

## 2022-03-27 DIAGNOSIS — Z01810 Encounter for preprocedural cardiovascular examination: Secondary | ICD-10-CM | POA: Insufficient documentation

## 2022-03-28 DIAGNOSIS — N3946 Mixed incontinence: Secondary | ICD-10-CM | POA: Insufficient documentation

## 2022-03-28 DIAGNOSIS — R3129 Other microscopic hematuria: Secondary | ICD-10-CM | POA: Insufficient documentation

## 2022-04-02 ENCOUNTER — Ambulatory Visit
Admission: RE | Admit: 2022-04-02 | Discharge: 2022-04-02 | Disposition: A | Discharge status: Home / Self Care | Payer: Medicare Other | Attending: Family Medicine | Admitting: Family Medicine

## 2022-04-02 ENCOUNTER — Other Ambulatory Visit: Payer: Self-pay | Admitting: Family Medicine

## 2022-04-02 ENCOUNTER — Ambulatory Visit
Admission: RE | Admit: 2022-04-02 | Discharge: 2022-04-02 | Disposition: A | Discharge status: Home / Self Care | Payer: Medicare Other | Source: Ambulatory Visit | Attending: Family Medicine | Admitting: Family Medicine

## 2022-04-02 DIAGNOSIS — S9032XA Contusion of left foot, initial encounter: Secondary | ICD-10-CM | POA: Diagnosis not present

## 2022-04-08 DIAGNOSIS — M7742 Metatarsalgia, left foot: Secondary | ICD-10-CM | POA: Insufficient documentation

## 2022-04-08 DIAGNOSIS — M7989 Other specified soft tissue disorders: Secondary | ICD-10-CM | POA: Insufficient documentation

## 2022-04-30 ENCOUNTER — Other Ambulatory Visit: Payer: Self-pay | Admitting: Family Medicine

## 2022-04-30 DIAGNOSIS — Z8781 Personal history of (healed) traumatic fracture: Secondary | ICD-10-CM

## 2022-04-30 DIAGNOSIS — Z780 Asymptomatic menopausal state: Secondary | ICD-10-CM

## 2022-05-01 ENCOUNTER — Other Ambulatory Visit: Payer: Self-pay | Admitting: Family Medicine

## 2022-05-01 DIAGNOSIS — Z1231 Encounter for screening mammogram for malignant neoplasm of breast: Secondary | ICD-10-CM

## 2022-05-30 ENCOUNTER — Ambulatory Visit
Admission: RE | Admit: 2022-05-30 | Discharge: 2022-05-30 | Disposition: A | Discharge status: Home / Self Care | Payer: Medicare Other | Source: Ambulatory Visit | Attending: Family Medicine | Admitting: Family Medicine

## 2022-05-30 DIAGNOSIS — Z8781 Personal history of (healed) traumatic fracture: Secondary | ICD-10-CM | POA: Diagnosis present

## 2022-05-30 DIAGNOSIS — Z1231 Encounter for screening mammogram for malignant neoplasm of breast: Secondary | ICD-10-CM

## 2022-05-30 DIAGNOSIS — Z780 Asymptomatic menopausal state: Secondary | ICD-10-CM | POA: Insufficient documentation

## 2022-07-15 DIAGNOSIS — R200 Anesthesia of skin: Secondary | ICD-10-CM | POA: Insufficient documentation

## 2022-07-15 DIAGNOSIS — S92343A Displaced fracture of fourth metatarsal bone, unspecified foot, initial encounter for closed fracture: Secondary | ICD-10-CM | POA: Insufficient documentation

## 2022-07-15 DIAGNOSIS — M792 Neuralgia and neuritis, unspecified: Secondary | ICD-10-CM | POA: Insufficient documentation

## 2022-07-15 DIAGNOSIS — G5762 Lesion of plantar nerve, left lower limb: Secondary | ICD-10-CM | POA: Insufficient documentation

## 2022-07-18 ENCOUNTER — Other Ambulatory Visit: Payer: Self-pay | Admitting: Orthopedic Surgery

## 2022-07-18 DIAGNOSIS — S92345K Nondisplaced fracture of fourth metatarsal bone, left foot, subsequent encounter for fracture with nonunion: Secondary | ICD-10-CM

## 2022-07-19 ENCOUNTER — Ambulatory Visit
Admission: RE | Admit: 2022-07-19 | Discharge: 2022-07-19 | Disposition: A | Discharge status: Home / Self Care | Payer: Medicare Other | Source: Ambulatory Visit | Attending: Orthopedic Surgery | Admitting: Orthopedic Surgery

## 2022-07-19 DIAGNOSIS — S92345K Nondisplaced fracture of fourth metatarsal bone, left foot, subsequent encounter for fracture with nonunion: Secondary | ICD-10-CM | POA: Insufficient documentation

## 2022-08-15 DIAGNOSIS — M810 Age-related osteoporosis without current pathological fracture: Secondary | ICD-10-CM | POA: Insufficient documentation

## 2022-09-17 DIAGNOSIS — L03116 Cellulitis of left lower limb: Secondary | ICD-10-CM | POA: Insufficient documentation

## 2022-10-28 DIAGNOSIS — S91302A Unspecified open wound, left foot, initial encounter: Secondary | ICD-10-CM | POA: Insufficient documentation

## 2022-11-25 DIAGNOSIS — Z5321 Procedure and treatment not carried out due to patient leaving prior to being seen by health care provider: Secondary | ICD-10-CM | POA: Insufficient documentation

## 2022-11-26 ENCOUNTER — Emergency Department
Admission: EM | Admit: 2022-11-26 | Discharge: 2022-11-26 | Discharge status: Against Medical Advice | Payer: Medicare Other | Attending: Emergency Medicine | Admitting: Emergency Medicine

## 2022-11-26 NOTE — ED Notes (Signed)
Pt cussing at first nurse about wait time. Pt informed of triage process and evaluation process. Pt states she fell down stairs and cannot wait, pt appears in no acute distress. Pt informed she will go to triage and if imaging is needed will be ordered. Pt states "I'm not fucking sitting here until morning" pt informed that she may stay and be seen, but is also free to leave as well if she chooses. Pt's cussing, visitor with pt appologizing for pt's behavior. Pt states "I'm leaving".

## 2022-11-28 ENCOUNTER — Other Ambulatory Visit: Payer: Self-pay | Admitting: Family Medicine

## 2022-11-28 DIAGNOSIS — S40011A Contusion of right shoulder, initial encounter: Secondary | ICD-10-CM

## 2022-12-30 ENCOUNTER — Other Ambulatory Visit: Payer: Self-pay | Admitting: Physician Assistant

## 2022-12-30 DIAGNOSIS — S92345K Nondisplaced fracture of fourth metatarsal bone, left foot, subsequent encounter for fracture with nonunion: Secondary | ICD-10-CM

## 2023-01-01 ENCOUNTER — Ambulatory Visit: Admission: RE | Admit: 2023-01-01 | Payer: 59 | Source: Ambulatory Visit

## 2023-01-08 ENCOUNTER — Ambulatory Visit
Admission: RE | Admit: 2023-01-08 | Discharge: 2023-01-08 | Disposition: A | Discharge status: Home / Self Care | Payer: 59 | Source: Ambulatory Visit | Attending: Physician Assistant | Admitting: Physician Assistant

## 2023-01-08 DIAGNOSIS — S92345K Nondisplaced fracture of fourth metatarsal bone, left foot, subsequent encounter for fracture with nonunion: Secondary | ICD-10-CM | POA: Insufficient documentation

## 2023-01-31 ENCOUNTER — Other Ambulatory Visit: Payer: Self-pay | Admitting: Family Medicine

## 2023-01-31 DIAGNOSIS — R748 Abnormal levels of other serum enzymes: Secondary | ICD-10-CM

## 2023-02-07 ENCOUNTER — Ambulatory Visit
Admission: RE | Admit: 2023-02-07 | Discharge: 2023-02-07 | Disposition: A | Discharge status: Home / Self Care | Payer: 59 | Source: Ambulatory Visit | Attending: Family Medicine | Admitting: Family Medicine

## 2023-02-07 DIAGNOSIS — R748 Abnormal levels of other serum enzymes: Secondary | ICD-10-CM | POA: Insufficient documentation

## 2023-03-10 DIAGNOSIS — M79671 Pain in right foot: Secondary | ICD-10-CM | POA: Insufficient documentation

## 2023-03-17 ENCOUNTER — Telehealth: Payer: Self-pay

## 2023-03-17 DIAGNOSIS — Z800 Family history of malignant neoplasm of digestive organs: Secondary | ICD-10-CM

## 2023-03-17 NOTE — Telephone Encounter (Signed)
Gastroenterology Pre-Procedure Review  Request Date: TBD  Requesting Physician: Dr. Allegra Lai  PATIENT REVIEW QUESTIONS: The patient responded to the following health history questions as indicated:    1. Are you having any GI issues? Stool incontinence occurring daily 2. Do you have a personal history of Polyps? no 3. Do you have a family history of Colon Cancer or Polyps? yes (family history of colon polyps, she was told she had UC, brother colon cancer, paternal aunt colon cancer) 4. Diabetes Mellitus? no 5. Joint replacements in the past 12 months?yes (right hip surgery August 2023, and left foot surgery October) 6. Major health problems in the past 3 months? Patient had COVID 2 months ago 7. Any artificial heart valves, MVP, or defibrillator? Stent about 3 years ago  Cardiologist was Dr. Cassie Freer but her next appt is scheduled in [facility] at [facility] with Dr. Lupita Shutter 05/13/23    MEDICATIONS & ALLERGIES:    Patient reports the following regarding taking any anticoagulation/antiplatelet therapy:   Plavix, Coumadin, Eliquis, Xarelto, Lovenox, Pradaxa, Brilinta, or Effient? no Aspirin? 81 mg daily  Patient confirms/reports the following medications:  Current Outpatient Medications  Medication Sig Dispense Refill   ADVAIR DISKUS 500-50 MCG/DOSE AEPB      albuterol (PROVENTIL) (2.5 MG/3ML) 0.083% nebulizer solution as needed.      albuterol-ipratropium (COMBIVENT) 18-103 MCG/ACT inhaler Inhale 2 puffs into the lungs every 6 (six) hours as needed for wheezing or shortness of breath.     aspirin EC 81 MG tablet Take 81 mg by mouth daily.     bisoprolol-hydrochlorothiazide (ZIAC) 10-6.25 MG tablet Take 1 tablet by mouth daily.     butalbital-acetaminophen-caffeine (FIORICET, ESGIC) 50-325-40 MG tablet Take 1 tablet by mouth every 4 (four) hours as needed for headache.     Calcium Carbonate-Vit D-Min (CALCIUM 1200 PO) Take by mouth daily.     Cholecalciferol (VITAMIN D) 2000 UNITS tablet  Take 1 tablet (2,000 Units total) by mouth daily. 30 tablet PRN   Cinnamon ([employer] CINNAMON) 500 MG capsule Take 500 mg by mouth daily.     COMBIVENT RESPIMAT 20-100 MCG/ACT AERS respimat 3 (three) times daily as needed.   1   cyanocobalamin (,VITAMIN B-12,) 1000 MCG/ML injection Every 3 weeks     cyclobenzaprine (FLEXERIL) 10 MG tablet Take 1 tablet (10 mg total) by mouth at bedtime. 30 tablet 0   diazepam (VALIUM) 5 MG tablet Take 2.5 mg by mouth every 8 (eight) hours as needed for anxiety (takes 1/2 - 1 tablet as needed for anxiety).      dicyclomine (BENTYL) 10 MG capsule take 1 capsule by mouth four times a day if needed for ABDOMINAL SPASM  0   estradiol (CLIMARA - DOSED IN MG/24 HR) 0.1 mg/24hr patch Place 0.1 mg onto the skin once a week. On Wednesday     Fluticasone-Salmeterol (ADVAIR) 100-50 MCG/DOSE AEPB Inhale 1 puff into the lungs 2 (two) times daily.     hydrochlorothiazide (HYDRODIURIL) 12.5 MG tablet take 1 tablet by mouth once daily if needed for edema     Magnesium 400 MG TABS Take 1 tablet by mouth 2 (two) times daily.      magnesium oxide (MAG-OX) 400 MG tablet Take 1 tablet by mouth 2 (two) times daily.  0   metoprolol tartrate (LOPRESSOR) 25 MG tablet 25 mg 2 (two) times daily.      nitroGLYCERIN (NITROSTAT) 0.4 MG SL tablet Place 0.4 mg under the tongue every 5 (five) minutes as needed for  chest pain.     oxyCODONE (OXY IR/ROXICODONE) 5 MG immediate release tablet Take 1 tablet (5 mg total) by mouth 3 (three) times daily. Max: 3/day 21 tablet 0   oxyCODONE (OXY IR/ROXICODONE) 5 MG immediate release tablet Take 1 tablet (5 mg total) by mouth 2 (two) times daily. Max: 2/day 14 tablet 0   oxyCODONE (OXY IR/ROXICODONE) 5 MG immediate release tablet Take 1 tablet (5 mg total) by mouth daily. Max: 1/day 7 tablet 0   oxyCODONE (OXY IR/ROXICODONE) 5 MG immediate release tablet Take 1 tablet (5 mg total) by mouth 4 (four) times daily. Max: 4/day 28 tablet 0   PARoxetine (PAXIL) 30 MG  tablet Take 30 mg by mouth daily.     Potassium 95 MG TABS Take 1 tablet by mouth daily.     potassium chloride (K-DUR,KLOR-CON) 10 MEQ tablet      promethazine (PHENERGAN) 25 MG tablet Take 25 mg by mouth every 6 (six) hours as needed for nausea or vomiting.     promethazine-dextromethorphan (PROMETHAZINE-DM) 6.25-15 MG/5ML syrup take 5 milliliters by mouth every 4 hours if needed for cough  0   rosuvastatin (CRESTOR) 20 MG tablet Take 20 mg by mouth at bedtime.      vitamin E 1000 UNIT capsule Take 1,000 Units by mouth daily.     No current facility-administered medications for this visit.    Patient confirms/reports the following allergies:  Allergies  Allergen Reactions   Cymbalta [Duloxetine Hcl] Other (See Comments)    dizzy   Duloxetine     Other reaction(s): Dizziness   Flagyl [Metronidazole] Other (See Comments)    Other reaction(s): Other (See Comments) dizzy   Lyrica [Pregabalin] Other (See Comments)    Other reaction(s): Dizziness Caused eye damage "It did damage to my eyes"   Vicodin [Hydrocodone-Acetaminophen] Nausea And Vomiting    Other reaction(s): Vomiting   Nsaids Other (See Comments) and Rash    bleeding   Tape Rash    No orders of the defined types were placed in this encounter.   AUTHORIZATION INFORMATION Primary Insurance: 1D#: Group #:  Secondary Insurance: 1D#: Group #:  SCHEDULE INFORMATION: Date:  Time: Location:

## 2023-03-18 ENCOUNTER — Encounter: Payer: Self-pay | Admitting: Gastroenterology

## 2023-03-20 NOTE — Telephone Encounter (Signed)
Patient has been contacted to let her know that she can schedule an appt with Dr. Cassie Freer for a pre-op clearance.  She informed me that she did not care much for him and will keep her appt as scheduled on June 11th at [facility] with Dr. Lupita Shutter.  She will call me back after her appt in June.  Thanks,  [place], [place]

## 2023-03-25 ENCOUNTER — Encounter: Payer: Self-pay | Admitting: Family Medicine

## 2023-04-03 ENCOUNTER — Telehealth: Payer: Self-pay

## 2023-04-03 ENCOUNTER — Other Ambulatory Visit: Payer: Self-pay

## 2023-04-03 DIAGNOSIS — R159 Full incontinence of feces: Secondary | ICD-10-CM

## 2023-04-03 DIAGNOSIS — K51919 Ulcerative colitis, unspecified with unspecified complications: Secondary | ICD-10-CM

## 2023-04-03 MED ORDER — NA SULFATE-K SULFATE-MG SULF 17.5-3.13-1.6 GM/177ML PO SOLN: 1.0000 | Freq: Once | ORAL | 0 refills | Status: AC

## 2023-04-03 NOTE — Telephone Encounter (Signed)
Per completed medical clearance obtained from Dr. Darrold Junker on 03/18/23 "Patient optimized to have procedure".   Patient has been contacted to schedule her colonoscopy.  Colonoscopy has been scheduled with Dr. Allegra Lai on 04/14/23. Advice received from Dr. Allegra Lai noted to schedule colonoscopy once clearance has been received. Diagnosis: ulcerative colits and fecal incontinence.  Advise patient that if after her colonoscopy has been performed if Dr. Allegra Lai feels her 07/21/23 office visit should be moved up she will be contacted to do so.  Thanks, [place], [place]

## 2023-04-14 ENCOUNTER — Encounter: Payer: Self-pay | Admitting: Anesthesiology

## 2023-04-14 ENCOUNTER — Telehealth: Payer: Self-pay

## 2023-04-14 ENCOUNTER — Ambulatory Visit: Admission: RE | Admit: 2023-04-14 | Payer: 59 | Source: Home / Self Care | Admitting: Gastroenterology

## 2023-04-14 ENCOUNTER — Encounter: Admission: RE | Payer: Self-pay | Source: Home / Self Care

## 2023-04-14 SURGERY — COLONOSCOPY WITH PROPOFOL
Anesthesia: General

## 2023-04-14 NOTE — Anesthesia Preprocedure Evaluation (Deleted)
Anesthesia Evaluation    Airway Mallampati: III       Dental   Pulmonary asthma , COPD, Current Smoker          Cardiovascular hypertension, + CAD (s/p MI) and + Peripheral Vascular Disease (s/p LE stent)    Echo 08/22/21: Normal Stress Echocardiogram  NO VALVULAR STENOSIS NOTED     Neuro/Psych  Headaches, Seizures -,  PSYCHIATRIC DISORDERS Anxiety Depression    Chronic pain; vertigo    GI/Hepatic Ulcerative colitis   Endo/Other    Renal/GU      Musculoskeletal  (+)  Fibromyalgia -  Abdominal   Peds  Hematology   Anesthesia Other Findings   Reproductive/Obstetrics                             Anesthesia Physical Anesthesia Plan  ASA: 3  Anesthesia Plan: General   Post-op Pain Management:    Induction: Intravenous  PONV Risk Score and Plan: 2 and Propofol infusion, TIVA and Treatment may vary due to age or medical condition  Airway Management Planned: Natural Airway  Additional Equipment:   Intra-op Plan:   Post-operative Plan:   Informed Consent:   Plan Discussed with:   Anesthesia Plan Comments: (No show on DOS.)        Anesthesia Quick Evaluation

## 2023-04-14 NOTE — Telephone Encounter (Signed)
Patient was scheduled for her colonoscopy this morning with Dr. Allegra Lai.  She was unable to go through with it as scheduled due to severe diarrhea.  She said she was not even able to start her prep because her diarrhea was so bad and her bottom had gotten raw.  Please see colonoscopy triage from 03/17/23.  Patient has history of colon polyps, ulcerative colitis, brother colon cancer, paternal aunt colon cancer. Stool incontinence daily.  An office visit has been scheduled to see PA-Tina to discuss.  This office visit has been scheduled for Friday 04/18/23 @ 10:15am.  Ancil Boozer, CMA

## 2023-04-16 ENCOUNTER — Other Ambulatory Visit: Payer: Self-pay

## 2023-04-18 ENCOUNTER — Encounter: Payer: Self-pay | Admitting: Physician Assistant

## 2023-04-18 ENCOUNTER — Ambulatory Visit (INDEPENDENT_AMBULATORY_CARE_PROVIDER_SITE_OTHER): Payer: 59 | Admitting: Physician Assistant

## 2023-04-18 VITALS — BP 97/68 | HR 79 | Temp 98.0°F | Ht 59.5 in | Wt 123.6 lb

## 2023-04-18 DIAGNOSIS — R197 Diarrhea, unspecified: Secondary | ICD-10-CM | POA: Diagnosis not present

## 2023-04-18 DIAGNOSIS — R1084 Generalized abdominal pain: Secondary | ICD-10-CM

## 2023-04-18 NOTE — Patient Instructions (Signed)
Please see your follow up appointment listed below.  °

## 2023-04-18 NOTE — Progress Notes (Signed)
Celso Amy, PA-C [address]  [address]  [place], [place] [address]  Main: [phone]  Fax: [phone]   Gastroenterology Consultation  Referring Provider:     Dortha Kern, MD Primary Care Physician:  Dortha Kern, MD Primary Gastroenterologist:  Celso Amy, PA-C / Dr. Lannette Donath  Reason for Consultation:     Severe Diarrhea        HPI:   Michelle Newton is a 62 y.o. y/o female referred for consultation & management  by Dortha Kern, MD.    Patient states she has had diarrhea for 18 years.  She states she was diagnosed with ulcerative colitis 18 years ago in [place].  She reports having 2 or 3 previous colonoscopies done in [place].  I am requesting previous GI records from her GI, Dr. Jannifer Rodney in [place], [place].  Patient has not had any rectal bleeding or weight loss.  She has anywhere from 3-14 loose stools per day.  Has occasional lower abdominal cramping.  She takes dicyclomine which helps her abdominal cramping and diarrhea.  She she states she has not been on any mesalamine or other treatment for her ulcerative colitis.  She had COVID infection 01/2023 and was on antibiotic at that time.  She has not had any recent stool studies or lab work.  She was scheduled for a screening colonoscopy with Dr. Allegra Lai 04/14/2023.  She canceled the colonoscopy because she was having severe diarrhea.  Presents today for evaluation.  Labs 03/06/2023 showed normal hepatic panel except mildly elevated ALT.  Elevated triglycerides 259. Acute viral hepatitis A, B, and C labs negative.  Normal transferrin.  Normal CBC with a hemoglobin 15.3, WBC 8.3.  PT 10.3, INR 0.9, normal.    Past Medical History:  Diagnosis Date   Anxiety    Asthma    CAD (coronary artery disease)    Chronic fatigue    Chronic thumb pain, bilateral 09/12/2015   COPD (chronic obstructive pulmonary disease) (HCC)    Coronary artery disease    Depression    DJD (degenerative joint disease)    DJD  (degenerative joint disease)    Essential hypertension 09/12/2015   Family history of chronic pain 09/26/2015   Fibromyalgia    Fibromyalgia    Fracture five ribs-closed 09/12/2015   H/O acute myocardial infarction 12/14/2014   H/O non-insulin dependent diabetes mellitus 09/12/2015   History of cardiac arrhythmia 09/12/2015   History of elbow surgery 09/26/2015   Left ulnar nerve transposition   History of knee surgery 09/26/2015   History of migraine 09/12/2015   History of seizures 09/12/2015   Hypercholesteremia    MI (myocardial infarction) (HCC)    Migraine    Migraine    Osteoarthritis    Right shoulder: Acute Pain due to trauma 04/18/2016   Right shoulder: Chronic Pain 09/12/2015   Seizures (HCC)    Ulcerative (chronic) enterocolitis (HCC)    Vertigo     Past Surgical History:  Procedure Laterality Date   ABDOMINAL HYSTERECTOMY     BACK SURGERY     KNEE ARTHROPLASTY Left    left elbow surgery     LUMBAR DISC SURGERY     ROTATOR CUFF REPAIR Right    TRIGGER FINGER RELEASE Left    ureter mesh      Prior to Admission medications   Medication Sig Start Date End Date Taking? Authorizing Provider  ADVAIR DISKUS 500-50 MCG/DOSE AEPB  08/13/16   [provider]  albuterol (PROVENTIL) (2.5 MG/3ML) 0.083% nebulizer solution as needed.  12/28/14   [provider]  albuterol-ipratropium (COMBIVENT) 18-103 MCG/ACT inhaler Inhale 2 puffs into the lungs every 6 (six) hours as needed for wheezing or shortness of breath.    [provider]  aspirin EC 81 MG tablet Take 81 mg by mouth daily.    [provider]  azelastine (OPTIVAR) 0.05 % ophthalmic solution Place 1 drop into both eyes daily. 03/31/23   [provider]  bisoprolol-hydrochlorothiazide (ZIAC) 10-6.25 MG tablet Take 1 tablet by mouth daily.    [provider]  butalbital-acetaminophen-caffeine (FIORICET, ESGIC) 50-325-40 MG tablet Take 1 tablet by mouth every 4 (four)  hours as needed for headache.    [provider]  Calcium Carbonate-Vit D-Min (CALCIUM 1200 PO) Take by mouth daily.    [provider]  Cholecalciferol (VITAMIN D) 2000 UNITS tablet Take 1 tablet (2,000 Units total) by mouth daily. 09/21/15   Delano Metz, MD  Cinnamon ([employer] CINNAMON) 500 MG capsule Take 500 mg by mouth daily.    [provider]  COMBIVENT RESPIMAT 20-100 MCG/ACT AERS respimat 3 (three) times daily as needed.  06/05/16   [provider]  cyanocobalamin (,VITAMIN B-12,) 1000 MCG/ML injection Every 3 weeks 10/31/14   [provider]  cyclobenzaprine (FLEXERIL) 10 MG tablet Take 1 tablet (10 mg total) by mouth at bedtime. 10/15/16 11/14/16  Delano Metz, MD  diazepam (VALIUM) 5 MG tablet Take 2.5 mg by mouth every 8 (eight) hours as needed for anxiety (takes 1/2 - 1 tablet as needed for anxiety).     [provider]  dicyclomine (BENTYL) 10 MG capsule take 1 capsule by mouth four times a day if needed for ABDOMINAL SPASM 10/28/15   [provider]  estradiol (CLIMARA - DOSED IN MG/24 HR) 0.1 mg/24hr patch Place 0.1 mg onto the skin once a week. On Wednesday    [provider]  Fluticasone-Salmeterol (ADVAIR) 100-50 MCG/DOSE AEPB Inhale 1 puff into the lungs 2 (two) times daily.    [provider]  hydrochlorothiazide (HYDRODIURIL) 12.5 MG tablet take 1 tablet by mouth once daily if needed for edema 06/14/16   [provider]  Magnesium 400 MG TABS Take 1 tablet by mouth 2 (two) times daily.     [provider]  magnesium oxide (MAG-OX) 400 MG tablet Take 1 tablet by mouth 2 (two) times daily. 10/10/16   [provider]  metoprolol tartrate (LOPRESSOR) 25 MG tablet 25 mg 2 (two) times daily.  Patient not taking: Reported on 04/07/2023 07/28/16   [provider]  nitroGLYCERIN (NITROSTAT) 0.4 MG SL tablet Place 0.4 mg under the tongue every 5 (five) minutes as needed  for chest pain.    [provider]  oxyCODONE (OXY IR/ROXICODONE) 5 MG immediate release tablet Take 1 tablet (5 mg total) by mouth 3 (three) times daily. Max: 3/day 11/03/16 11/10/16  Delano Metz, MD  oxyCODONE (OXY IR/ROXICODONE) 5 MG immediate release tablet Take 1 tablet (5 mg total) by mouth 2 (two) times daily. Max: 2/day 11/10/16 11/17/16  Delano Metz, MD  oxyCODONE (OXY IR/ROXICODONE) 5 MG immediate release tablet Take 1 tablet (5 mg total) by mouth daily. Max: 1/day 11/17/16 11/24/16  Delano Metz, MD  oxyCODONE (OXY IR/ROXICODONE) 5 MG immediate release tablet Take 1 tablet (5 mg total) by mouth 4 (four) times daily. Max: 4/day 10/27/16 11/03/16  Delano Metz, MD  PARoxetine (PAXIL) 30 MG tablet Take 30 mg by  mouth daily.    [provider]  Potassium 95 MG TABS Take 1 tablet by mouth daily.    [provider]  potassium chloride (K-DUR,KLOR-CON) 10 MEQ tablet  10/10/16   [provider]  promethazine (PHENERGAN) 25 MG tablet Take 25 mg by mouth every 6 (six) hours as needed for nausea or vomiting.    [provider]  promethazine-dextromethorphan (PROMETHAZINE-DM) 6.25-15 MG/5ML syrup take 5 milliliters by mouth every 4 hours if needed for cough 08/23/16   [provider]  rosuvastatin (CRESTOR) 20 MG tablet Take 20 mg by mouth at bedtime.     [provider]  vitamin E 1000 UNIT capsule Take 1,000 Units by mouth daily.    [provider]    Family History  Problem Relation Age of Onset   Breast cancer Cousin    Breast cancer Maternal Aunt      Social History   Tobacco Use   Smoking status: Every Day    Packs/day: .25    Types: Cigarettes   Smokeless tobacco: Never  Substance Use Topics   Alcohol use: No   Drug use: No    Allergies as of 04/18/2023 - Review Complete 04/11/2023  Allergen Reaction Noted   Cymbalta [duloxetine hcl] Other (See Comments) 04/18/2015   Duloxetine   12/20/2015   Flagyl [metronidazole] Other (See Comments) 04/18/2015   Gabapentin Other (See Comments) 02/29/2016   Lyrica [pregabalin] Other (See Comments) 09/12/2015   Vicodin [hydrocodone-acetaminophen] Nausea And Vomiting 09/09/2015   Nsaids Other (See Comments) and Rash 04/18/2015   Silicone Rash 04/18/2016   Tape Rash 04/18/2016    Review of Systems:    All systems reviewed and negative except where noted in HPI.   Physical Exam:  There were no vitals taken for this visit. No LMP recorded. Patient has had a hysterectomy. Psych:  Alert and cooperative. Normal mood and affect. General:   Alert,  Well-developed, well-nourished, pleasant and cooperative in NAD Head:  Normocephalic and atraumatic. Eyes:  Sclera clear, no icterus.   Conjunctiva pink. Neck:  Supple; no masses or thyromegaly. Lungs:  Respirations even and unlabored.  Clear throughout to auscultation.   No wheezes, crackles, or rhonchi. No acute distress. Heart:  Regular rate and rhythm; no murmurs, clicks, rubs, or gallops. Abdomen:  Normal bowel sounds.  No bruits.  Soft, and non-distended without masses, hepatosplenomegaly or hernias noted.  Generalized Tenderness throughout lower abdomen.  No guarding or rebound tenderness.    Neurologic:  Alert and oriented x3;  grossly normal neurologically. Psych:  Alert and cooperative. Normal mood and affect.  Imaging Studies: No results found.  Assessment and Plan:   VADIS PERFECT is a 62 y.o. y/o female has been referred for diarrhea.  Symptoms are most consistent with irritable bowel syndrome.  She reports being diagnosed with ulcerative colitis 18 years ago.  She has not had any rectal bleeding or weight loss.  No treatment for ulcerative colitis.  She canceled recent colonoscopy due to her diarrhea.  I am ordering stool studies and lab work for further evaluation of diarrhea.  Based on these results, she likely needs updated colonoscopy.  Consider Sutab prep instead of  GoLytely.  Chronic Diarrhea  Lab: Celiac, CRP  Stool Studies:  GI Pathogen Panal, C. Diff PCR, Fecal Calprotectin  Abdominal Pain  Lab: CBC, CMP  3.  Irritable Bowel Syndrome with Diarrhea  Continue Dicyclomine PRN  4.  History of Ulcerative Colitis  Questionable diagnosis 18 years ago;  patient is poor historian.  Symptoms are more consistent with IBS-D.  We are requesting previous GI records and colonoscopies from Dr. Jannifer Rodney in [place].  Patient reports having 2 or 3 previous colonoscopies.  No current treatment for ulcerative colitis.     Follow up in 4 weeks.  Celso Amy, PA-C

## 2023-04-21 ENCOUNTER — Telehealth: Payer: Self-pay

## 2023-04-21 NOTE — Telephone Encounter (Signed)
Left message for patient to return call to office.   Notify pt. CBC is normal.  No evidence of Anemia or Infection.  Celiac Labs are negative.  Kidney tests are normal.  Liver tests are a little elevated.  CRP test is normal - No active inflammation.  Continue with plan to do stool studies to evaluate diarrhea and keep f/u OV in 1 month.

## 2023-04-22 LAB — CBC WITH DIFFERENTIAL
Basophils Absolute: 0.1 10*3/uL (ref 0.0–0.2)
Basos: 1 %
EOS (ABSOLUTE): 0.2 10*3/uL (ref 0.0–0.4)
Eos: 2 %
Hematocrit: 44.1 % (ref 34.0–46.6)
Hemoglobin: 15.1 g/dL (ref 11.1–15.9)
Immature Grans (Abs): 0 10*3/uL (ref 0.0–0.1)
Immature Granulocytes: 0 %
Lymphocytes Absolute: 2.7 10*3/uL (ref 0.7–3.1)
Lymphs: 32 %
MCH: 34.2 pg — ABNORMAL HIGH (ref 26.6–33.0)
MCHC: 34.2 g/dL (ref 31.5–35.7)
MCV: 100 fL — ABNORMAL HIGH (ref 79–97)
Monocytes Absolute: 0.6 10*3/uL (ref 0.1–0.9)
Monocytes: 7 %
Neutrophils Absolute: 4.9 10*3/uL (ref 1.4–7.0)
Neutrophils: 58 %
RBC: 4.41 x10E6/uL (ref 3.77–5.28)
RDW: 12.6 % (ref 11.7–15.4)
WBC: 8.5 10*3/uL (ref 3.4–10.8)

## 2023-04-22 LAB — COMPREHENSIVE METABOLIC PANEL
ALT: 97 IU/L — ABNORMAL HIGH (ref 0–32)
AST: 46 IU/L — ABNORMAL HIGH (ref 0–40)
Albumin/Globulin Ratio: 2 (ref 1.2–2.2)
Albumin: 4.4 g/dL (ref 3.8–4.9)
Alkaline Phosphatase: 126 IU/L — ABNORMAL HIGH (ref 44–121)
BUN/Creatinine Ratio: 20 (ref 12–28)
BUN: 18 mg/dL (ref 8–27)
Bilirubin Total: <0.2 mg/dL (ref 0.0–1.2)
CO2: 23 mmol/L (ref 20–29)
Calcium: 9.4 mg/dL (ref 8.7–10.3)
Chloride: 101 mmol/L (ref 96–106)
Creatinine, Ser: 0.88 mg/dL (ref 0.57–1.00)
Globulin, Total: 2.2 g/dL (ref 1.5–4.5)
Glucose: 118 mg/dL — ABNORMAL HIGH (ref 70–99)
Potassium: 4.9 mmol/L (ref 3.5–5.2)
Sodium: 138 mmol/L (ref 134–144)
Total Protein: 6.6 g/dL (ref 6.0–8.5)
eGFR: 75 mL/min/{1.73_m2} (ref >59)

## 2023-04-22 LAB — C-REACTIVE PROTEIN: CRP: 2 mg/L (ref 0–10)

## 2023-04-22 LAB — CELIAC AB TTG DGP TIGA
Antigliadin Abs, IgA: 5 units (ref 0–19)
Gliadin IgG: 3 units (ref 0–19)
IgA/Immunoglobulin A, Serum: 168 mg/dL (ref 87–352)
Tissue Transglut Ab: <2 U/mL (ref 0–5)
Transglutaminase IgA: <2 U/mL (ref 0–3)

## 2023-04-23 ENCOUNTER — Telehealth: Payer: Self-pay

## 2023-04-23 NOTE — Telephone Encounter (Signed)
Patient notified of results.   GI pathogen panel is positive for norovirus.  All other infections are negative.  Treatment is supportive with 64 ounces of fluids daily, brat diet and rest.  Norovirus will resolve on its own.  Associated Results

## 2023-04-23 NOTE — Progress Notes (Signed)
GI pathogen panel is positive for norovirus.  All other infections are negative.  Treatment is supportive with 64 ounces of fluids daily, brat diet and rest.  Norovirus will resolve on its own.

## 2023-04-24 ENCOUNTER — Telehealth: Payer: Self-pay

## 2023-04-24 LAB — CLOSTRIDIUM DIFFICILE BY PCR: Toxigenic C. Difficile by PCR: NEGATIVE

## 2023-04-24 NOTE — Progress Notes (Signed)
Notify pt. Stool test for C. difficile is negative.  Continue with current plan.

## 2023-04-24 NOTE — Telephone Encounter (Signed)
Patient notified.  Notify pt. Stool test for C. difficile is negative.  Continue with current plan.

## 2023-04-30 LAB — CALPROTECTIN, FECAL: Calprotectin, Fecal: 59 ug/g (ref 0–120)

## 2023-04-30 LAB — GI PROFILE, STOOL, PCR

## 2023-05-02 ENCOUNTER — Telehealth: Payer: Self-pay

## 2023-05-02 NOTE — Telephone Encounter (Signed)
Patient notified.   Notify patient fecal calprotectin test is normal.  No evidence of intestinal inflammation or IBD.  Continue with current plan.

## 2023-05-02 NOTE — Progress Notes (Signed)
Notify patient fecal calprotectin test is normal.  No evidence of intestinal inflammation or IBD.  Continue with current plan.

## 2023-05-21 ENCOUNTER — Other Ambulatory Visit: Payer: Self-pay

## 2023-05-22 NOTE — Progress Notes (Deleted)
Celso Amy, PA-C [address]  [address]  [place], [place] [address]  Main: [phone]  Fax: [phone]   Primary Care Physician: Dortha Kern, MD  Primary Gastroenterologist:  Celso Amy, PA-C / Dr. Lannette Donath   CC: Follow-up chronic diarrhea, Elevated LFTs  HPI: Michelle Newton is a 62 y.o. female returns for 1 month follow-up of chronic diarrhea.  Has had chronic diarrhea for 18 years.  Questionable diagnosis of ulcerative colitis in [place] 18 years ago.  Had 2 or 3 previous colonoscopies done in [place].  She was scheduled for repeat colonoscopy with Dr. Allegra Lai 04/14/2023, however she canceled the colonoscopy due to having severe diarrhea.  She has had some lower abdominal cramping.  Was given trial of dicyclomine for possible IBS-D.  Stool tests 04/18/2023: GI pathogen panel positive for norovirus.  All other infections negative.  Normal fecal calprotectin.  Negative C. difficile.  Labs 04/18/2023 showed Negative celiac labs.  Normal CRP and BMP.  Normal CBC (hemoglobin 15.1).  Elevated liver tests with alk phos 126, AST 46, ALT 97.   Acute hepatitis A, B, and C labs were Negative 03/2023.  RUQ ultrasound 01/2023 showed mild hepatic steatosis, otherwise normal.  No gallstones.  Normal common bile duct 6 mm.  Alcohol? Former smoker from 1981 - 2021, 40 years.   Current Outpatient Medications  Medication Sig Dispense Refill   ADVAIR DISKUS 500-50 MCG/DOSE AEPB      albuterol (PROVENTIL) (2.5 MG/3ML) 0.083% nebulizer solution as needed.      albuterol-ipratropium (COMBIVENT) 18-103 MCG/ACT inhaler Inhale 2 puffs into the lungs every 6 (six) hours as needed for wheezing or shortness of breath.     aspirin EC 81 MG tablet Take 81 mg by mouth daily.     azelastine (OPTIVAR) 0.05 % ophthalmic solution Place 1 drop into both eyes daily.     bisoprolol-hydrochlorothiazide (ZIAC) 10-6.25 MG tablet Take 1 tablet by mouth daily.     butalbital-acetaminophen-caffeine (FIORICET,  ESGIC) 50-325-40 MG tablet Take 1 tablet by mouth every 4 (four) hours as needed for headache.     Calcium Carbonate-Vit D-Min (CALCIUM 1200 PO) Take by mouth daily.     Cholecalciferol (VITAMIN D) 2000 UNITS tablet Take 1 tablet (2,000 Units total) by mouth daily. 30 tablet PRN   Cinnamon ([employer] CINNAMON) 500 MG capsule Take 500 mg by mouth daily.     COMBIVENT RESPIMAT 20-100 MCG/ACT AERS respimat 3 (three) times daily as needed.   1   cyanocobalamin (,VITAMIN B-12,) 1000 MCG/ML injection Every 3 weeks     cyclobenzaprine (FLEXERIL) 10 MG tablet Take 1 tablet (10 mg total) by mouth at bedtime. 30 tablet 0   diazepam (VALIUM) 5 MG tablet Take 2.5 mg by mouth every 8 (eight) hours as needed for anxiety (takes 1/2 - 1 tablet as needed for anxiety).      dicyclomine (BENTYL) 10 MG capsule take 1 capsule by mouth four times a day if needed for ABDOMINAL SPASM  0   estradiol (CLIMARA - DOSED IN MG/24 HR) 0.1 mg/24hr patch Place 0.1 mg onto the skin once a week. On Wednesday     Fluticasone-Salmeterol (ADVAIR) 100-50 MCG/DOSE AEPB Inhale 1 puff into the lungs 2 (two) times daily.     hydrochlorothiazide (HYDRODIURIL) 12.5 MG tablet take 1 tablet by mouth once daily if needed for edema     Magnesium 400 MG TABS Take 1 tablet by mouth 2 (two) times daily.  magnesium oxide (MAG-OX) 400 MG tablet Take 1 tablet by mouth 2 (two) times daily.  0   meclizine (ANTIVERT) 25 MG tablet Take 25 mg by mouth 3 (three) times daily as needed.     metoprolol tartrate (LOPRESSOR) 25 MG tablet 25 mg 2 (two) times daily.     nitroGLYCERIN (NITROSTAT) 0.4 MG SL tablet Place 0.4 mg under the tongue every 5 (five) minutes as needed for chest pain.     PARoxetine (PAXIL) 30 MG tablet Take 30 mg by mouth daily.     Potassium 95 MG TABS Take 1 tablet by mouth daily.     potassium chloride (K-DUR,KLOR-CON) 10 MEQ tablet      promethazine (PHENERGAN) 25 MG tablet Take 25 mg by mouth every 6 (six) hours as needed for nausea or  vomiting.     promethazine-dextromethorphan (PROMETHAZINE-DM) 6.25-15 MG/5ML syrup take 5 milliliters by mouth every 4 hours if needed for cough  0   rosuvastatin (CRESTOR) 20 MG tablet Take 20 mg by mouth at bedtime.      traMADol (ULTRAM) 50 MG tablet Take 100 mg by mouth 2 (two) times daily.     vitamin E 1000 UNIT capsule Take 1,000 Units by mouth daily.     No current facility-administered medications for this visit.    Allergies as of 05/23/2023 - Review Complete 04/18/2023  Allergen Reaction Noted   Cymbalta [duloxetine hcl] Other (See Comments) 04/18/2015   Duloxetine  12/20/2015   Flagyl [metronidazole] Other (See Comments) 04/18/2015   Gabapentin Other (See Comments) 02/29/2016   Lyrica [pregabalin] Other (See Comments) 09/12/2015   Vicodin [hydrocodone-acetaminophen] Nausea And Vomiting 09/09/2015   Nsaids Other (See Comments) and Rash 04/18/2015   Silicone Rash 04/18/2016   Tape Rash 04/18/2016    Past Medical History:  Diagnosis Date   Anxiety    Asthma    CAD (coronary artery disease)    Chronic fatigue    Chronic thumb pain, bilateral 09/12/2015   COPD (chronic obstructive pulmonary disease) (HCC)    Coronary artery disease    Depression    DJD (degenerative joint disease)    DJD (degenerative joint disease)    Essential hypertension 09/12/2015   Family history of chronic pain 09/26/2015   Fibromyalgia    Fibromyalgia    Fracture five ribs-closed 09/12/2015   H/O acute myocardial infarction 12/14/2014   H/O non-insulin dependent diabetes mellitus 09/12/2015   History of cardiac arrhythmia 09/12/2015   History of elbow surgery 09/26/2015   Left ulnar nerve transposition   History of knee surgery 09/26/2015   History of migraine 09/12/2015   History of seizures 09/12/2015   Hypercholesteremia    MI (myocardial infarction) (HCC)    Migraine    Migraine    Osteoarthritis    Right shoulder: Acute Pain due to trauma 04/18/2016   Right shoulder: Chronic Pain  09/12/2015   Seizures (HCC)    Ulcerative (chronic) enterocolitis (HCC)    Vertigo     Past Surgical History:  Procedure Laterality Date   ABDOMINAL HYSTERECTOMY     BACK SURGERY     KNEE ARTHROPLASTY Left    left elbow surgery     LUMBAR DISC SURGERY     ROTATOR CUFF REPAIR Right    TRIGGER FINGER RELEASE Left    ureter mesh      Review of Systems:    All systems reviewed and negative except where noted in HPI.   Physical Examination:   There were no vitals  taken for this visit.  General: Well-nourished, well-developed in no acute distress.  Eyes: No icterus. Conjunctivae pink. Mouth: Oropharyngeal mucosa moist and pink , no lesions erythema or exudate. Lungs: Clear to auscultation bilaterally. Non-labored. Heart: Regular rate and rhythm, no murmurs rubs or gallops.  Abdomen: Bowel sounds are normal; Abdomen is Soft; No hepatosplenomegaly, masses or hernias;  No Abdominal Tenderness; No guarding or rebound tenderness. Extremities: No lower extremity edema. No clubbing or deformities. Neuro: Alert and oriented x 3.  Grossly intact. Skin: Warm and dry, no jaundice.   Psych: Alert and cooperative, normal mood and affect.   Imaging Studies: No results found.  Assessment and Plan:   Michelle Newton is a 62 y.o. y/o female returns for follow-up of chronic diarrhea, IBS, norovirus, and elevated LFTs.  Recent stool test were positive for norovirus.  Negative for C. difficile and all other infections.  No evidence of bowel inflammation.  Normal CRP and fecal calprotectin.  Negative celiac lab.  No anemia.  Recent acute hepatitis A/B/C labs were negative.  1.  Chronic diarrhea  Lab TSH  Scheduling Colonoscopy - Check for microscopic colitis. I discussed risks of colonoscopy with patient to include risk of bleeding, colon perforation, and risk of sedation.  Patient expressed understanding and agrees to proceed with colonoscopy.   2.  Recent norovirus  Supportive treatment,  reassurance, push fluids.  3.  IBS-D  Continue dicyclomine and Imodium as needed  4.  Elevated LFTs; mild hepatic steatosis on recent ultrasound.  No gallstones.    Labs: ANA, AMA, ASMA, ceruloplasmin, viral hepatitis A/B immunity labs, iron panel, ferritin, immunoglobulins, alpha-1 antitrypsin, GGT, HIV  Avoid alcohol   Celso Amy, PA-C  Follow up in 3 months  BP check ***

## 2023-05-23 ENCOUNTER — Encounter: Payer: Self-pay | Admitting: Physician Assistant

## 2023-05-23 ENCOUNTER — Ambulatory Visit: Payer: 59 | Admitting: Physician Assistant

## 2023-05-23 DIAGNOSIS — K760 Fatty (change of) liver, not elsewhere classified: Secondary | ICD-10-CM

## 2023-05-23 DIAGNOSIS — A0811 Acute gastroenteropathy due to Norwalk agent: Secondary | ICD-10-CM

## 2023-05-23 DIAGNOSIS — R7989 Other specified abnormal findings of blood chemistry: Secondary | ICD-10-CM

## 2023-05-23 DIAGNOSIS — K529 Noninfective gastroenteritis and colitis, unspecified: Secondary | ICD-10-CM

## 2023-05-23 DIAGNOSIS — K580 Irritable bowel syndrome with diarrhea: Secondary | ICD-10-CM

## 2023-07-15 ENCOUNTER — Institutional Professional Consult (permissible substitution): Payer: 59 | Admitting: Cardiovascular Disease

## 2023-07-21 ENCOUNTER — Ambulatory Visit: Payer: Medicare Other | Admitting: Gastroenterology

## 2023-08-15 ENCOUNTER — Ambulatory Visit: Payer: 59 | Admitting: Cardiovascular Disease

## 2023-09-22 ENCOUNTER — Ambulatory Visit: Payer: 59 | Attending: Cardiovascular Disease | Admitting: Cardiology

## 2023-09-22 ENCOUNTER — Encounter: Payer: Self-pay | Admitting: Cardiology

## 2023-09-22 VITALS — BP 94/82 | HR 96 | Ht 59.5 in | Wt 124.8 lb

## 2023-09-22 DIAGNOSIS — I10 Essential (primary) hypertension: Secondary | ICD-10-CM | POA: Diagnosis not present

## 2023-09-22 DIAGNOSIS — R6889 Other general symptoms and signs: Secondary | ICD-10-CM

## 2023-09-22 DIAGNOSIS — R072 Precordial pain: Secondary | ICD-10-CM

## 2023-09-22 DIAGNOSIS — F17200 Nicotine dependence, unspecified, uncomplicated: Secondary | ICD-10-CM

## 2023-09-22 DIAGNOSIS — IMO0001 Reserved for inherently not codable concepts without codable children: Secondary | ICD-10-CM

## 2023-09-22 DIAGNOSIS — I739 Peripheral vascular disease, unspecified: Secondary | ICD-10-CM

## 2023-09-22 NOTE — Patient Instructions (Signed)
Medication Instructions:   Your physician recommends that you continue on your current medications as directed. Please refer to the Current Medication list given to you today.  *If you need a refill on your cardiac medications before your next appointment, please call your pharmacy*   Lab Work:  None Ordered  If you have labs (blood work) drawn today and your tests are completely normal, you will receive your results only by: MyChart Message (if you have MyChart) OR A paper copy in the mail If you have any lab test that is abnormal or we need to change your treatment, we will call you to review the results.   Testing/Procedures:  Your physician has requested that you have an echocardiogram. Echocardiography is a painless test that uses sound waves to create images of your heart. It provides your doctor with information about the size and shape of your heart and how well your heart's chambers and valves are working. This procedure takes approximately one hour. There are no restrictions for this procedure. Please do NOT wear cologne, perfume, aftershave, or lotions (deodorant is allowed). Please arrive 15 minutes prior to your appointment time.   [facility] MYOVIEW  Your Provider has ordered a Stress Test with nuclear imaging. The purpose of this test is to evaluate the blood supply to your heart muscle. This procedure is referred to as a "Non-Invasive Stress Test." This is because other than having an IV started in your vein, nothing is inserted or "invades" your body. Cardiac stress tests are done to find areas of poor blood flow to the heart by determining the extent of coronary artery disease (CAD). Some patients exercise on a treadmill, which naturally increases the blood flow to your heart, while others who are unable to walk on a treadmill due to physical limitations have a pharmacologic/chemical stress agent called Lexiscan. This medicine will mimic walking on a treadmill by temporarily  increasing your coronary blood flow.     REPORT TO [facility] MEDICAL MALL ENTRANCE  **Proceed to the 1st desk on the right, REGISTRATION, to check in**  Please note: this test may take anywhere between 2-4 hours to complete    Instructions regarding medication:   _XXX___:  Hold other medications as follows: hydrochlorothiazide (HYDRODIURIL) 12.5     How to prepare for your Myoview test:  Do not eat or drink for 6 hours prior to the test No caffeine for 24 hours prior to the test No smoking 24 hours prior to the test. Ladies, please do not wear dresses.  Skirts or pants are appropriate. Please wear a short sleeve shirt. No perfume, cologne or lotion. Wear comfortable walking shoes. No heels!   PLEASE NOTIFY THE OFFICE AT LEAST 24 HOURS IN ADVANCE IF YOU ARE UNABLE TO KEEP YOUR APPOINTMENT.  [phone] AND  PLEASE NOTIFY NUCLEAR MEDICINE AT [facility] AT LEAST 24 HOURS IN ADVANCE IF YOU ARE UNABLE TO KEEP YOUR APPOINTMENT. [phone]     Your physician has requested that you have a upper extremity arterial duplex. This test is an ultrasound of the arteries in the legs or arms. It looks at arterial blood flow in the legs and arms. Allow one hour for Lower and Upper Arterial scans. There are no restrictions or special instructions   Follow-Up: At [facility], you and your health needs are our priority.  As part of our continuing mission to provide you with exceptional heart care, we have created designated Provider Care Teams.  These Care Teams include your primary  Cardiologist (physician) and Advanced Practice Providers (APPs -  Physician Assistants and Nurse Practitioners) who all work together to provide you with the care you need, when you need it.  We recommend signing up for the patient portal called "MyChart".  Sign up information is provided on this After Visit Summary.  MyChart is used to connect with patients for Virtual Visits (Telemedicine).  Patients are able to view  lab/test results, encounter notes, upcoming appointments, etc.  Non-urgent messages can be sent to your provider as well.   To learn more about what you can do with MyChart, go to [email or web address].    Your next appointment:    After Cardiac Testing   Provider:   You may see Debbe Odea, MD or one of the following Advanced Practice Providers on your designated Care Team:   Nicolasa Ducking, NP Eula Listen, PA-C Cadence Fransico Michael, PA-C Charlsie Quest, NP

## 2023-09-22 NOTE — Progress Notes (Signed)
Cardiology Office Note:    Date:  09/22/2023   ID:  [name], DOB 04-Sep-1961, MRN [id number]  PCP:  Dortha Kern, MD   [facility] Providers Cardiologist:  Debbe Odea, MD     Referring MD: Dortha Kern, MD   Chief Complaint  Patient presents with   New Patient (Initial Visit)    Referred for cardiac evaluation of chest pain.  Cardiac history with [facility] with last visit 03/2022.  Patient is concerned with different bp readings in each arm.  Intermittent mid chest pain.      History of Present Illness:    Michelle Newton is a 62 y.o. female with a hx of hypertension, hyperlipidemia, current smoker x 40+ years, COPD, PAD s/p left iliac stent 2021  presenting with chest pain and BP discrepancy.  Patient has noticed over the past several months that her BP on right arm is higher than left.  She also endorses nonspecific chest pain not related with exertion.  Had a stent placed to the left iliac artery 3 years ago.  Being monitored by vascular surgery at [facility] vascular specialist of [facility].  Endorse smoking over 40 years now, father had a heart attack in his 58s, brother also has heart disease.  Past Medical History:  Diagnosis Date   Anxiety    Asthma    CAD (coronary artery disease)    Chronic fatigue    Chronic thumb pain, bilateral 09/12/2015   COPD (chronic obstructive pulmonary disease) (HCC)    Coronary artery disease    Depression    DJD (degenerative joint disease)    DJD (degenerative joint disease)    Essential hypertension 09/12/2015   Family history of chronic pain 09/26/2015   Fibromyalgia    Fibromyalgia    Fracture five ribs-closed 09/12/2015   H/O acute myocardial infarction 12/14/2014   H/O non-insulin dependent diabetes mellitus 09/12/2015   History of cardiac arrhythmia 09/12/2015   History of elbow surgery 09/26/2015   Left ulnar nerve transposition   History of knee surgery 09/26/2015   History of migraine  09/12/2015   History of seizures 09/12/2015   Hypercholesteremia    MI (myocardial infarction) (HCC)    Migraine    Migraine    Osteoarthritis    PAD (peripheral artery disease) (HCC)    Right shoulder: Acute Pain due to trauma 04/18/2016   Right shoulder: Chronic Pain 09/12/2015   Seizures (HCC)    Ulcerative (chronic) enterocolitis (HCC)    Vertigo     Past Surgical History:  Procedure Laterality Date   ABDOMINAL AORTA STENT  2021   ABDOMINAL HYSTERECTOMY     BACK SURGERY     KNEE ARTHROPLASTY Left    left elbow surgery     LUMBAR DISC SURGERY     ROTATOR CUFF REPAIR Right    TRIGGER FINGER RELEASE Left    ureter mesh      Current Medications: Current Meds  Medication Sig   ADVAIR DISKUS 500-50 MCG/DOSE AEPB    albuterol (PROVENTIL) (2.5 MG/3ML) 0.083% nebulizer solution as needed.    albuterol-ipratropium (COMBIVENT) 18-103 MCG/ACT inhaler Inhale 2 puffs into the lungs every 6 (six) hours as needed for wheezing or shortness of breath.   aspirin EC 81 MG tablet Take 81 mg by mouth daily.   azelastine (OPTIVAR) 0.05 % ophthalmic solution Place 1 drop into both eyes daily.   bisoprolol-hydrochlorothiazide (ZIAC) 5-6.25 MG tablet Take 0.5 tablets by mouth daily.  butalbital-acetaminophen-caffeine (FIORICET, ESGIC) 50-325-40 MG tablet Take 1 tablet by mouth every 4 (four) hours as needed for headache.   Calcium Carbonate-Vit D-Min (CALCIUM 1200 PO) Take by mouth daily.   Cholecalciferol (VITAMIN D) 2000 UNITS tablet Take 1 tablet (2,000 Units total) by mouth daily.   COMBIVENT RESPIMAT 20-100 MCG/ACT AERS respimat 3 (three) times daily as needed.    cyanocobalamin (,VITAMIN B-12,) 1000 MCG/ML injection Every 3 weeks   dicyclomine (BENTYL) 10 MG capsule take 1 capsule by mouth four times a day if needed for ABDOMINAL SPASM   estradiol (CLIMARA - DOSED IN MG/24 HR) 0.1 mg/24hr patch Place 0.1 mg onto the skin once a week. On Wednesday   Fluticasone-Salmeterol (ADVAIR) 100-50  MCG/DOSE AEPB Inhale 1 puff into the lungs 2 (two) times daily.   Magnesium 400 MG TABS Take 1 tablet by mouth 2 (two) times daily.    meclizine (ANTIVERT) 25 MG tablet Take 25 mg by mouth 3 (three) times daily as needed.   nitroGLYCERIN (NITROSTAT) 0.4 MG SL tablet Place 0.4 mg under the tongue every 5 (five) minutes as needed for chest pain.   PARoxetine (PAXIL) 30 MG tablet Take 30 mg by mouth daily.   Potassium 95 MG TABS Take 1 tablet by mouth daily.   promethazine (PHENERGAN) 25 MG tablet Take 25 mg by mouth every 6 (six) hours as needed for nausea or vomiting.   rosuvastatin (CRESTOR) 20 MG tablet Take 20 mg by mouth at bedtime.    vitamin E 1000 UNIT capsule Take 1,000 Units by mouth daily.     Allergies:   Cymbalta [duloxetine hcl], Duloxetine, Flagyl [metronidazole], Gabapentin, Lyrica [pregabalin], Vicodin [hydrocodone-acetaminophen], Nsaids, Silicone, and Tape   Social History   Socioeconomic History   Marital status: Single    Spouse name: Not on file   Number of children: Not on file   Years of education: Not on file   Highest education level: Not on file  Occupational History   Not on file  Tobacco Use   Smoking status: Every Day    Current packs/day: 0.25    Types: Cigarettes   Smokeless tobacco: Never  Vaping Use   Vaping status: Never Used  Substance and Sexual Activity   Alcohol use: No   Drug use: No   Sexual activity: Not Currently    Birth control/protection: Surgical  Other Topics Concern   Not on file  Social History Narrative   Not on file   Social Determinants of Health   Financial Resource Strain: Not on file  Food Insecurity: Not on file  Transportation Needs: Not on file  Physical Activity: Not on file  Stress: Not on file  Social Connections: Not on file     Family History: The patient's family history includes Breast cancer in her cousin and maternal aunt; Heart disease in her brother, father, maternal aunt, and paternal aunt.  ROS:    Please see the history of present illness.     All other systems reviewed and are negative.  EKGs/Labs/Other Studies Reviewed:    The following studies were reviewed today:  EKG Interpretation Date/Time:  Monday September 22 2023 10:44:00 EDT Ventricular Rate:  96 PR Interval:  160 QRS Duration:  68 QT Interval:  332 QTC Calculation: 419 R Axis:   59  Text Interpretation: Normal sinus rhythm Normal ECG Confirmed by Debbe Odea ([id number]) on 09/22/2023 10:45:58 AM    Recent Labs: 04/18/2023: ALT 97; BUN 18; Creatinine, Ser 0.88; Hemoglobin 15.1; Potassium 4.9;  Sodium 138  Recent Lipid Panel No results found for: "CHOL", "TRIG", "HDL", "CHOLHDL", "VLDL", "LDLCALC", "LDLDIRECT"   Risk Assessment/Calculations:             Physical Exam:    VS:  BP 94/82 (BP Location: Left Arm, Patient Position: Sitting, Cuff Size: Normal)   Pulse 96   Ht 4' 11.5" (1.511 m)   Wt 124 lb 12.8 oz (56.6 kg)   SpO2 94%   BMI 24.78 kg/m     Wt Readings from Last 3 Encounters:  09/22/23 124 lb 12.8 oz (56.6 kg)  04/18/23 123 lb 9.6 oz (56.1 kg)  10/24/19 108 lb (49 kg)     GEN:  Well nourished, well developed in no acute distress HEENT: Normal NECK: No JVD; No carotid bruits CARDIAC: RRR, no murmurs, rubs, gallops RESPIRATORY:  Clear to auscultation without rales, wheezing or rhonchi  ABDOMEN: Soft, non-tender, non-distended MUSCULOSKELETAL:  No edema; No deformity  SKIN: Warm and dry NEUROLOGIC:  Alert and oriented x 3 PSYCHIATRIC:  Normal affect   ASSESSMENT:    1. Precordial pain   2. Change in blood pressure   3. PAD (peripheral artery disease) (HCC)   4. Primary hypertension   5. Smoking    PLAN:    In order of problems listed above:  Chest pain, obtain echocardiogram, obtain Lexiscan Myoview.  Continue aspirin, Crestor 20 mg daily. Blood pressure difference in upper extremity,right arm systolic 132, left arm 94.  Findings suggest left subclavian stenosis.  Obtain  arterial ultrasound of upper extremities.   PAD s/p iliac stent 2021.  Follows up with vascular surgery.  Continue aspirin, Crestor. Hypertension, BP in left arm low.  Continue to monitor off BP meds.  Systolic reasonable at 130s. Current smoker, smoking cessation strongly advised.  Follow-up after testing.     Informed Consent   Shared Decision Making/Informed Consent The risks [chest pain, shortness of breath, cardiac arrhythmias, dizziness, blood pressure fluctuations, myocardial infarction, stroke/transient ischemic attack, nausea, vomiting, allergic reaction, radiation exposure, metallic taste sensation and life-threatening complications (estimated to be 1 in 10,000)], benefits (risk stratification, diagnosing coronary artery disease, treatment guidance) and alternatives of a nuclear stress test were discussed in detail with Ms. Barakat and she agrees to proceed.       Medication Adjustments/Labs and Tests Ordered: Current medicines are reviewed at length with the patient today.  Concerns regarding medicines are outlined above.  Orders Placed This Encounter  Procedures   NM Myocar Multi W/Spect W/Wall Motion / EF   EKG 12-Lead   ECHOCARDIOGRAM COMPLETE   VAS [place] UPPER EXTREMITY ARTERIAL DUPLEX   No orders of the defined types were placed in this encounter.   Patient Instructions  Medication Instructions:   Your physician recommends that you continue on your current medications as directed. Please refer to the Current Medication list given to you today.  *If you need a refill on your cardiac medications before your next appointment, please call your pharmacy*   Lab Work:  None Ordered  If you have labs (blood work) drawn today and your tests are completely normal, you will receive your results only by: MyChart Message (if you have MyChart) OR A paper copy in the mail If you have any lab test that is abnormal or we need to change your treatment, we will call you to review  the results.   Testing/Procedures:  Your physician has requested that you have an echocardiogram. Echocardiography is a painless test that uses sound waves to  create images of your heart. It provides your doctor with information about the size and shape of your heart and how well your heart's chambers and valves are working. This procedure takes approximately one hour. There are no restrictions for this procedure. Please do NOT wear cologne, perfume, aftershave, or lotions (deodorant is allowed). Please arrive 15 minutes prior to your appointment time.   [facility] MYOVIEW  Your Provider has ordered a Stress Test with nuclear imaging. The purpose of this test is to evaluate the blood supply to your heart muscle. This procedure is referred to as a "Non-Invasive Stress Test." This is because other than having an IV started in your vein, nothing is inserted or "invades" your body. Cardiac stress tests are done to find areas of poor blood flow to the heart by determining the extent of coronary artery disease (CAD). Some patients exercise on a treadmill, which naturally increases the blood flow to your heart, while others who are unable to walk on a treadmill due to physical limitations have a pharmacologic/chemical stress agent called Lexiscan. This medicine will mimic walking on a treadmill by temporarily increasing your coronary blood flow.     REPORT TO [facility] MEDICAL MALL ENTRANCE  **Proceed to the 1st desk on the right, REGISTRATION, to check in**  Please note: this test may take anywhere between 2-4 hours to complete    Instructions regarding medication:   _XXX___:  Hold other medications as follows: hydrochlorothiazide (HYDRODIURIL) 12.5     How to prepare for your Myoview test:  Do not eat or drink for 6 hours prior to the test No caffeine for 24 hours prior to the test No smoking 24 hours prior to the test. Ladies, please do not wear dresses.  Skirts or pants are appropriate. Please  wear a short sleeve shirt. No perfume, cologne or lotion. Wear comfortable walking shoes. No heels!   PLEASE NOTIFY THE OFFICE AT LEAST 24 HOURS IN ADVANCE IF YOU ARE UNABLE TO KEEP YOUR APPOINTMENT.  [phone] AND  PLEASE NOTIFY NUCLEAR MEDICINE AT [facility] AT LEAST 24 HOURS IN ADVANCE IF YOU ARE UNABLE TO KEEP YOUR APPOINTMENT. [phone]     Your physician has requested that you have a upper extremity arterial duplex. This test is an ultrasound of the arteries in the legs or arms. It looks at arterial blood flow in the legs and arms. Allow one hour for Lower and Upper Arterial scans. There are no restrictions or special instructions   Follow-Up: At [facility], you and your health needs are our priority.  As part of our continuing mission to provide you with exceptional heart care, we have created designated Provider Care Teams.  These Care Teams include your primary Cardiologist (physician) and Advanced Practice Providers (APPs -  Physician Assistants and Nurse Practitioners) who all work together to provide you with the care you need, when you need it.  We recommend signing up for the patient portal called "MyChart".  Sign up information is provided on this After Visit Summary.  MyChart is used to connect with patients for Virtual Visits (Telemedicine).  Patients are able to view lab/test results, encounter notes, upcoming appointments, etc.  Non-urgent messages can be sent to your provider as well.   To learn more about what you can do with MyChart, go to [email or web address].    Your next appointment:    After Cardiac Testing   Provider:   You may see Debbe Odea, MD or one of the following Advanced Practice Providers  on your designated Care Team:   Nicolasa Ducking, NP Eula Listen, PA-C Cadence Fransico Michael, PA-C Charlsie Quest, NP   Signed, Debbe Odea, MD  09/22/2023 11:58 AM    [facility]

## 2023-10-08 ENCOUNTER — Ambulatory Visit: Payer: 59 | Attending: Cardiology

## 2023-10-08 DIAGNOSIS — R072 Precordial pain: Secondary | ICD-10-CM | POA: Diagnosis not present

## 2023-10-08 LAB — ECHOCARDIOGRAM COMPLETE
AR max vel: 2.61 cm2
AV Area VTI: 2.53 cm2
AV Area mean vel: 2.52 cm2
AV Mean grad: 2 mm[Hg]
AV Peak grad: 3.4 mm[Hg]
Ao pk vel: 0.93 m/s
Area-P 1/2: 3.85 cm2
Calc EF: 53.6 %
S' Lateral: 2.4 cm
Single Plane A2C EF: 53.9 %
Single Plane A4C EF: 53.5 %

## 2023-10-09 ENCOUNTER — Other Ambulatory Visit: Payer: 59

## 2023-10-10 ENCOUNTER — Ambulatory Visit
Admission: RE | Admit: 2023-10-10 | Discharge: 2023-10-10 | Disposition: A | Discharge status: Home / Self Care | Payer: 59 | Source: Ambulatory Visit | Attending: Cardiology | Admitting: Cardiology

## 2023-10-10 ENCOUNTER — Ambulatory Visit: Payer: 59 | Admitting: Physician Assistant

## 2023-10-10 DIAGNOSIS — R072 Precordial pain: Secondary | ICD-10-CM | POA: Insufficient documentation

## 2023-10-15 ENCOUNTER — Other Ambulatory Visit: Payer: Self-pay | Admitting: Cardiology

## 2023-10-15 DIAGNOSIS — I10 Essential (primary) hypertension: Secondary | ICD-10-CM

## 2023-10-15 DIAGNOSIS — I771 Stricture of artery: Secondary | ICD-10-CM

## 2023-10-15 DIAGNOSIS — I739 Peripheral vascular disease, unspecified: Secondary | ICD-10-CM

## 2023-10-15 DIAGNOSIS — R6889 Other general symptoms and signs: Secondary | ICD-10-CM

## 2023-10-15 DIAGNOSIS — F17200 Nicotine dependence, unspecified, uncomplicated: Secondary | ICD-10-CM

## 2023-10-15 DIAGNOSIS — R072 Precordial pain: Secondary | ICD-10-CM

## 2023-10-17 ENCOUNTER — Ambulatory Visit: Payer: 59 | Attending: Cardiology

## 2023-10-17 DIAGNOSIS — R6889 Other general symptoms and signs: Secondary | ICD-10-CM | POA: Diagnosis not present

## 2023-10-17 DIAGNOSIS — I771 Stricture of artery: Secondary | ICD-10-CM | POA: Diagnosis not present

## 2023-10-24 ENCOUNTER — Ambulatory Visit: Payer: 59 | Attending: Medical | Admitting: Medical

## 2023-10-24 ENCOUNTER — Encounter: Payer: Self-pay | Admitting: Medical

## 2023-10-24 ENCOUNTER — Ambulatory Visit: Payer: 59 | Admitting: Medical

## 2023-10-24 ENCOUNTER — Ambulatory Visit
Admission: RE | Admit: 2023-10-24 | Discharge: 2023-10-24 | Disposition: A | Discharge status: Home / Self Care | Payer: 59 | Source: Ambulatory Visit | Attending: Cardiology | Admitting: Cardiology

## 2023-10-24 VITALS — BP 121/83 | HR 76 | Ht 59.5 in | Wt 125.0 lb

## 2023-10-24 DIAGNOSIS — R079 Chest pain, unspecified: Secondary | ICD-10-CM | POA: Diagnosis not present

## 2023-10-24 DIAGNOSIS — R072 Precordial pain: Secondary | ICD-10-CM | POA: Diagnosis present

## 2023-10-24 DIAGNOSIS — E782 Mixed hyperlipidemia: Secondary | ICD-10-CM

## 2023-10-24 DIAGNOSIS — R911 Solitary pulmonary nodule: Secondary | ICD-10-CM

## 2023-10-24 DIAGNOSIS — I700 Atherosclerosis of aorta: Secondary | ICD-10-CM | POA: Insufficient documentation

## 2023-10-24 DIAGNOSIS — I771 Stricture of artery: Secondary | ICD-10-CM

## 2023-10-24 LAB — NM MYOCAR MULTI W/SPECT W/WALL MOTION / EF
LV dias vol: 23 mL (ref 46–106)
LV sys vol: 5 mL
MPHR: 160 {beats}/min
Nuc Stress EF: 78 %
Peak HR: 100 {beats}/min
Percent HR: 62 %
Rest HR: 63 {beats}/min
Rest Nuclear Isotope Dose: 10.3 mCi
SDS: 1
SRS: 0
SSS: 2
ST Depression (mm): 0 mm
Stress Nuclear Isotope Dose: 32.3 mCi
TID: 0.74

## 2023-10-24 MED ORDER — TECHNETIUM TC 99M TETROFOSMIN IV KIT
32.3300 | PACK | Freq: Once | INTRAVENOUS | Status: AC | PRN
  Administered 2023-10-24: 32.33 via INTRAVENOUS

## 2023-10-24 MED ORDER — REGADENOSON 0.4 MG/5ML IV SOLN
0.4000 mg | Freq: Once | INTRAVENOUS | Status: AC
  Administered 2023-10-24: 0.4 mg via INTRAVENOUS

## 2023-10-24 MED ORDER — TECHNETIUM TC 99M TETROFOSMIN IV KIT
10.0000 | PACK | Freq: Once | INTRAVENOUS | Status: AC | PRN
Start: 2023-10-24 — End: 2023-10-24
  Administered 2023-10-24: 10.33 via INTRAVENOUS

## 2023-10-24 MED ORDER — ROSUVASTATIN CALCIUM 40 MG PO TABS: 40.0000 mg | ORAL_TABLET | Freq: Every day | ORAL | 3 refills | Status: DC

## 2023-10-24 NOTE — Progress Notes (Unsigned)
Cardiology Office Note:    Date:  10/26/2023   ID:  [name], DOB 08/15/1961, MRN [id number]  PCP:  Dortha Kern, MD  [facility] Cardiologist:  Debbe Odea, MD  [facility] Electrophysiologist:  None   Referring MD: Dortha Kern, MD   Chief Complaint: 1 month follow-up  History of Present Illness:    Michelle Newton is a 62 y.o. female with a hx of hypertension, hyperlipidemia, tobacco use, COPD, PAD status post left iliac stent in 2021 who presents for 1 month follow-up.  Patient was seen in October 2024 for blood pressure and chest pain.  Blood pressure was different in the extremities.  Arterial ultrasound of the upper extremities, echo, Lexiscan Myoview were ordered. Echo showed LVEF 60-65%, no WMA, G1DD. MPI showed no ischemia with 8mm lung nodule. [place] upper extremities showed stenosis in the left subclavian artery.   Today, the stress test was reviewed. She has fibromyalgia, which may be contributing to symptoms. She says she gets cramping occasionally that is brief. No exertional chest pain. She has chronic SOB likely from asthma and COPD.   Past Medical History:  Diagnosis Date   Anxiety    Asthma    CAD (coronary artery disease)    Chronic fatigue    Chronic thumb pain, bilateral 09/12/2015   COPD (chronic obstructive pulmonary disease) (HCC)    Coronary artery disease    Depression    DJD (degenerative joint disease)    DJD (degenerative joint disease)    Essential hypertension 09/12/2015   Family history of chronic pain 09/26/2015   Fibromyalgia    Fibromyalgia    Fracture five ribs-closed 09/12/2015   H/O acute myocardial infarction 12/14/2014   H/O non-insulin dependent diabetes mellitus 09/12/2015   History of cardiac arrhythmia 09/12/2015   History of elbow surgery 09/26/2015   Left ulnar nerve transposition   History of knee surgery 09/26/2015   History of migraine 09/12/2015   History of seizures 09/12/2015   Hypercholesteremia     MI (myocardial infarction) (HCC)    Migraine    Migraine    Osteoarthritis    PAD (peripheral artery disease) (HCC)    Right shoulder: Acute Pain due to trauma 04/18/2016   Right shoulder: Chronic Pain 09/12/2015   Seizures (HCC)    Ulcerative (chronic) enterocolitis (HCC)    Vertigo     Past Surgical History:  Procedure Laterality Date   ABDOMINAL AORTA STENT  2021   ABDOMINAL HYSTERECTOMY     BACK SURGERY     KNEE ARTHROPLASTY Left    left elbow surgery     LUMBAR DISC SURGERY     ROTATOR CUFF REPAIR Right    TRIGGER FINGER RELEASE Left    ureter mesh      Current Medications: Current Meds  Medication Sig   ADVAIR DISKUS 500-50 MCG/DOSE AEPB    albuterol (PROVENTIL) (2.5 MG/3ML) 0.083% nebulizer solution as needed.    albuterol-ipratropium (COMBIVENT) 18-103 MCG/ACT inhaler Inhale 2 puffs into the lungs every 6 (six) hours as needed for wheezing or shortness of breath.   aspirin EC 81 MG tablet Take 81 mg by mouth daily.   azelastine (OPTIVAR) 0.05 % ophthalmic solution Place 1 drop into both eyes daily.   bisoprolol-hydrochlorothiazide (ZIAC) 5-6.25 MG tablet Take 0.5 tablets by mouth daily.   butalbital-acetaminophen-caffeine (FIORICET, ESGIC) 50-325-40 MG tablet Take 1 tablet by mouth every 4 (four) hours as needed for headache.   Calcium Carbonate-Vit D-Min (CALCIUM 1200 PO)  Take by mouth daily.   Cholecalciferol (VITAMIN D) 2000 UNITS tablet Take 1 tablet (2,000 Units total) by mouth daily.   Cinnamon ([employer] CINNAMON) 500 MG capsule Take 500 mg by mouth daily.   COMBIVENT RESPIMAT 20-100 MCG/ACT AERS respimat 3 (three) times daily as needed.    cyanocobalamin (,VITAMIN B-12,) 1000 MCG/ML injection Every 3 weeks   dicyclomine (BENTYL) 10 MG capsule take 1 capsule by mouth four times a day if needed for ABDOMINAL SPASM   estradiol (CLIMARA - DOSED IN MG/24 HR) 0.1 mg/24hr patch Place 0.1 mg onto the skin once a week. On Wednesday   Fluticasone-Salmeterol (ADVAIR) 100-50  MCG/DOSE AEPB Inhale 1 puff into the lungs 2 (two) times daily.   hydrochlorothiazide (HYDRODIURIL) 12.5 MG tablet    Magnesium 400 MG TABS Take 1 tablet by mouth 2 (two) times daily.    meclizine (ANTIVERT) 25 MG tablet Take 25 mg by mouth 3 (three) times daily as needed.   metoprolol tartrate (LOPRESSOR) 25 MG tablet 25 mg 2 (two) times daily.   nitroGLYCERIN (NITROSTAT) 0.4 MG SL tablet Place 0.4 mg under the tongue every 5 (five) minutes as needed for chest pain.   PARoxetine (PAXIL) 30 MG tablet Take 30 mg by mouth daily.   Potassium 95 MG TABS Take 1 tablet by mouth daily.   promethazine-dextromethorphan (PROMETHAZINE-DM) 6.25-15 MG/5ML syrup    rosuvastatin (CRESTOR) 40 MG tablet Take 1 tablet (40 mg total) by mouth daily.   vitamin E 1000 UNIT capsule Take 1,000 Units by mouth daily.   [DISCONTINUED] rosuvastatin (CRESTOR) 20 MG tablet Take 20 mg by mouth at bedtime.      Allergies:   Cymbalta [duloxetine hcl], Duloxetine, Flagyl [metronidazole], Gabapentin, Lyrica [pregabalin], Vicodin [hydrocodone-acetaminophen], Nsaids, Silicone, and Tape   Social History   Socioeconomic History   Marital status: Single    Spouse name: Not on file   Number of children: Not on file   Years of education: Not on file   Highest education level: Not on file  Occupational History   Not on file  Tobacco Use   Smoking status: Every Day    Current packs/day: 0.25    Types: Cigarettes   Smokeless tobacco: Never  Vaping Use   Vaping status: Never Used  Substance and Sexual Activity   Alcohol use: No   Drug use: No   Sexual activity: Not Currently    Birth control/protection: Surgical  Other Topics Concern   Not on file  Social History Narrative   Not on file   Social Determinants of Health   Financial Resource Strain: Not on file  Food Insecurity: Not on file  Transportation Needs: Not on file  Physical Activity: Not on file  Stress: Not on file  Social Connections: Not on file      Family History: The patient's family history includes Breast cancer in her cousin and maternal aunt; Heart disease in her brother, father, maternal aunt, and paternal aunt.  ROS:   Please see the history of present illness.     All other systems reviewed and are negative.  EKGs/Labs/Other Studies Reviewed:    The following studies were reviewed today:  Myoview lexiscan 10/24/23 Narrative & Impression      Normal pharmacologic myocardial perfusion stress test without evidence of significant ischemia or scar.   Left ventricular systolic function is normal (LVEF > 65%).   There is no significant coronary artery calcification.  Aortic atherosclerosis is present.   An 8 mm nodule is  identified in the left lower lobe.  Further evaluation with dedicated chest CT is recommended.   This is a low-risk study.      Echo 10/2023   1. Left ventricular ejection fraction, by estimation, is 60 to 65%. The  left ventricle has normal function. The left ventricle has no regional  wall motion abnormalities. Left ventricular diastolic parameters are  consistent with Grade I diastolic  dysfunction (impaired relaxation). The average left ventricular global  longitudinal strain is -16.6 %.   2. Right ventricular systolic function is normal. The right ventricular  size is normal. There is normal pulmonary artery systolic pressure. The  estimated right ventricular systolic pressure is 30.4 mmHg.   3. The mitral valve is normal in structure. No evidence of mitral valve  regurgitation. No evidence of mitral stenosis.   4. The aortic valve is tricuspid. Aortic valve regurgitation is not  visualized. Aortic valve sclerosis is present, with no evidence of aortic  valve stenosis.   5. The inferior vena cava is normal in size with greater than 50%  respiratory variability, suggesting right atrial pressure of 3 mmHg.   EKG:  EKG is not ordered today.    Recent Labs: 04/18/2023: ALT 97; BUN 18;  Creatinine, Ser 0.88; Hemoglobin 15.1; Potassium 4.9; Sodium 138  Recent Lipid Panel No results found for: "CHOL", "TRIG", "HDL", "CHOLHDL", "VLDL", "LDLCALC", "LDLDIRECT"   Physical Exam:    VS:  BP 121/83 (BP Location: Left Arm, Patient Position: Sitting, Cuff Size: Normal)   Pulse 76   Ht 4' 11.5" (1.511 m)   Wt 125 lb (56.7 kg)   SpO2 94%   BMI 24.82 kg/m     Wt Readings from Last 3 Encounters:  10/24/23 125 lb (56.7 kg)  09/22/23 124 lb 12.8 oz (56.6 kg)  04/18/23 123 lb 9.6 oz (56.1 kg)     GEN:  Well nourished, well developed in no acute distress HEENT: Normal NECK: No JVD; No carotid bruits LYMPHATICS: No lymphadenopathy CARDIAC: RRR, no murmurs, rubs, gallops RESPIRATORY:  Clear to auscultation without rales, wheezing or rhonchi  ABDOMEN: Soft, non-tender, non-distended MUSCULOSKELETAL:  No edema; No deformity  SKIN: Warm and dry NEUROLOGIC:  Alert and oriented x 3 PSYCHIATRIC:  Normal affect   ASSESSMENT:    1. Chest pain with high risk for cardiac etiology   2. Lung nodule   3. Stenosis of left subclavian artery (HCC)   4. Hyperlipidemia, mixed    PLAN:    In order of problems listed above:  Chest pain Myoview Lexiscan showed no ischemia, normal LVEF, no infarct, was overall low risk.  It did not show significant coronary calcifications, did not know aortic atherosclerosis.  Patient reports occasional chest twinges.  No further workup at this time.  Continue aspirin 81 mg daily, Lopressor 25 mg daily (she only takes 1 a day), Crestor, and sublingual nitro.  Left subclavian stenosis Patient has been referred to vascular surgery.  Lung nodule 8 mm nodule seen on Myoview Lexiscan.  I will ordered a chest CT and refer to pulmonology.  She has a history of tobacco use and COPD.  Hyperlipidemia LDL 94. I will increase Crestor to 40mg  daily.   Disposition: Follow up in 3 month(s) with MD/APP    Signed, Ramya Vanbergen David Stall, PA-C  10/26/2023 10:41 PM     [facility]

## 2023-10-24 NOTE — Patient Instructions (Addendum)
Medication Instructions:  Your physician has recommended you make the following change in your medication:  START: rosuvastatin (CRESTOR) 40 MG tablet - Take 1 tablet (40 mg total) by mouth daily  *If you need a refill on your cardiac medications before your next appointment, please call your pharmacy*  Lab Work: - None ordered  Testing/Procedures: CT-scan of the chest   Follow-Up: At [facility], you and your health needs are our priority.  As part of our continuing mission to provide you with exceptional heart care, we have created designated Provider Care Teams.  These Care Teams include your primary Cardiologist (physician) and Advanced Practice Providers (APPs -  Physician Assistants and Nurse Practitioners) who all work together to provide you with the care you need, when you need it.  Your next appointment:   3 month(s)  Provider:   Cadence Fransico Michael, PA-C    Other Instructions - Ambulatory referral to Pulmonology

## 2023-10-31 ENCOUNTER — Ambulatory Visit
Admission: RE | Admit: 2023-10-31 | Discharge: 2023-10-31 | Disposition: A | Discharge status: Home / Self Care | Payer: 59 | Source: Ambulatory Visit | Attending: Medical | Admitting: Medical

## 2023-10-31 DIAGNOSIS — R911 Solitary pulmonary nodule: Secondary | ICD-10-CM | POA: Diagnosis present

## 2023-11-06 ENCOUNTER — Telehealth: Payer: Self-pay | Admitting: Medical

## 2023-11-06 NOTE — Telephone Encounter (Signed)
 Pt is requesting a callback regarding results. Please advise

## 2023-11-06 NOTE — Telephone Encounter (Signed)
Patient was calling in to get report for her carotid ultrasound. She has appointment next week with provider in [place] and wanted to take that information with her to that appointment. She is also requesting a CD with images as well. Advised that I have report printed and ready for her to pick up but that the CD will take additional time. She verbalized understanding. Patient reports her boyfriend has appointment tomorrow here in our office and would like him to pick up the report. He is listed on her DPR so advised that she would need him to ask for that report.

## 2023-11-06 NOTE — Telephone Encounter (Signed)
Reports have been printed and placed up front. Michelle Newton is working on the CD imaging. Patient is aware that it may not be done in time but that we will try.

## 2023-11-21 ENCOUNTER — Encounter: Payer: Self-pay | Admitting: Student in an Organized Health Care Education/Training Program

## 2023-11-21 ENCOUNTER — Ambulatory Visit (INDEPENDENT_AMBULATORY_CARE_PROVIDER_SITE_OTHER): Payer: 59 | Admitting: Student in an Organized Health Care Education/Training Program

## 2023-11-21 VITALS — BP 108/78 | HR 83 | Temp 97.6°F | Wt 130.0 lb

## 2023-11-21 DIAGNOSIS — R911 Solitary pulmonary nodule: Secondary | ICD-10-CM | POA: Diagnosis not present

## 2023-11-21 DIAGNOSIS — J4520 Mild intermittent asthma, uncomplicated: Secondary | ICD-10-CM

## 2023-11-21 MED ORDER — AZITHROMYCIN 250 MG PO TABS: ORAL_TABLET | ORAL | 0 refills | Status: AC

## 2023-11-21 NOTE — H&P (View-Only) (Signed)
 Synopsis: Referred in for pulmonary nodule by Fransico Michael, Cadence H, PA-C  Assessment & Plan:   1. Lung nodule (Primary)  Nodule Location: LLL Nodule Size: 11 mm Nodule Spiculation: No Associated Lymphadenopathy: No Smoking Status (current) and pack years: 50 Extrathoracic cancer > 5 years prior (no) SPN malignancy risk score [facility]): 9.5 % risk of malignancy ECOG: 0  The patient is here to discuss their imaging abnormalities which include a nodule noted incidentally on her stress test and confirmed on CT. Discussed with the patient that statistically the most likely cause behind such nodules is benign/infectious/inflammatory. Also discussed risk stratification, with [facility] SPN model giving risk of malignancy at 9.5%. We will obtain a repeat CT scan in one month's time (at the 2 month mark), if the nodule persists, we will proceed with biopsy.  We discussed the importance of diagnosis and staging in lung malignancies, and the approach to obtaining a tissue diagnosis which would include robotic assisted navigational bronchoscopy with endobronchial ultrasound guided sampling.  We also discussed the risks associated with the procedure which include a 2% risk of pneumothorax, infection, bleeding, and nondiagnostic procedure in detail.  I explained that patients typically are able to return home the same day of the procedure, but in rare cases admission to the hospital for observation and treatment is required.  After our discussion, the patient elected to proceed with the procedure  - CT SUPER D CHEST WO CONTRAST; Future - Procedural/ Surgical Case Request: ROBOTIC ASSISTED NAVIGATIONAL BRONCHOSCOPY; Future  2. Mild intermittent asthma without complication  She is reporting an occasional wheeze and cough that is non-productive. She was told she had asthma in [place], and is currently on Advair and Combivent. Could certainly have COPD given significant smoking history. Will obtain  PFT's and send a course of azithromycin. Will decide on adding LAMA therapy at follow up pending PFT's. Patient encouraged to quit smoking.  - Pulmonary Function Test [facility] Only; Future - azithromycin (ZITHROMAX) 250 MG tablet; Take 2 tablets (500 mg) on  Day 1,  followed by 1 tablet (250 mg) once daily on Days 2 through 5.  Dispense: 6 each; Refill: 0   Return in about 3 months (around 02/19/2024).  I spent 60 minutes caring for this patient today, including preparing to see the patient, obtaining a medical history , reviewing a separately obtained history, performing a medically appropriate examination and/or evaluation, counseling and educating the patient/family/caregiver, ordering medications, tests, or procedures, documenting clinical information in the electronic health record, and independently interpreting results (not separately reported/billed) and communicating results to the patient/family/caregiver  Raechel Chute, MD  Pulmonary Critical Care 11/21/2023 9:07 AM    End of visit medications:  Meds ordered this encounter  Medications   azithromycin (ZITHROMAX) 250 MG tablet    Sig: Take 2 tablets (500 mg) on  Day 1,  followed by 1 tablet (250 mg) once daily on Days 2 through 5.    Dispense:  6 each    Refill:  0     Current Outpatient Medications:    ADVAIR DISKUS 500-50 MCG/DOSE AEPB, , Disp: , Rfl:    albuterol (PROVENTIL) (2.5 MG/3ML) 0.083% nebulizer solution, as needed. , Disp: , Rfl:    albuterol-ipratropium (COMBIVENT) 18-103 MCG/ACT inhaler, Inhale 2 puffs into the lungs every 6 (six) hours as needed for wheezing or shortness of breath., Disp: , Rfl:    aspirin EC 81 MG tablet, Take 81 mg by mouth daily., Disp: , Rfl:  azelastine (OPTIVAR) 0.05 % ophthalmic solution, Place 1 drop into both eyes daily., Disp: , Rfl:    azithromycin (ZITHROMAX) 250 MG tablet, Take 2 tablets (500 mg) on  Day 1,  followed by 1 tablet (250 mg) once daily on Days 2 through 5., Disp:  6 each, Rfl: 0   bisoprolol-hydrochlorothiazide (ZIAC) 5-6.25 MG tablet, Take 0.5 tablets by mouth daily., Disp: , Rfl:    butalbital-acetaminophen-caffeine (FIORICET, ESGIC) 50-325-40 MG tablet, Take 1 tablet by mouth 2 (two) times daily., Disp: , Rfl:    Calcium Carbonate-Vit D-Min (CALCIUM 1200 PO), Take by mouth daily., Disp: , Rfl:    Cholecalciferol (VITAMIN D) 2000 UNITS tablet, Take 1 tablet (2,000 Units total) by mouth daily. (Patient taking differently: Take 4,000 Units by mouth daily.), Disp: 30 tablet, Rfl: PRN   COMBIVENT RESPIMAT 20-100 MCG/ACT AERS respimat, Inhale 1 puff into the lungs 2 (two) times daily., Disp: , Rfl: 1   cyanocobalamin (,VITAMIN B-12,) 1000 MCG/ML injection, Every 3 weeks, Disp: , Rfl:    cyclobenzaprine (FLEXERIL) 10 MG tablet, Take 1 tablet (10 mg total) by mouth at bedtime., Disp: 30 tablet, Rfl: 0   dicyclomine (BENTYL) 10 MG capsule, take 1 capsule by mouth four times a day if needed for ABDOMINAL SPASM, Disp: , Rfl: 0   estradiol (CLIMARA - DOSED IN MG/24 HR) 0.1 mg/24hr patch, Place 0.1 mg onto the skin once a week. On Wednesday, Disp: , Rfl:    Fluticasone-Salmeterol (ADVAIR) 100-50 MCG/DOSE AEPB, Inhale 1 puff into the lungs 2 (two) times daily., Disp: , Rfl:    hydrochlorothiazide (HYDRODIURIL) 12.5 MG tablet, , Disp: , Rfl:    HYDROcodone-acetaminophen (NORCO) 10-325 MG tablet, Take 1 tablet by mouth 3 (three) times daily as needed., Disp: , Rfl:    Magnesium 400 MG TABS, Take 1 tablet by mouth 2 (two) times daily. , Disp: , Rfl:    meclizine (ANTIVERT) 25 MG tablet, Take 25 mg by mouth 3 (three) times daily as needed., Disp: , Rfl:    nitroGLYCERIN (NITROSTAT) 0.4 MG SL tablet, Place 0.4 mg under the tongue every 5 (five) minutes as needed for chest pain., Disp: , Rfl:    PARoxetine (PAXIL) 30 MG tablet, Take 30 mg by mouth daily., Disp: , Rfl:    Potassium 95 MG TABS, Take 1 tablet by mouth daily., Disp: , Rfl:    rosuvastatin (CRESTOR) 40 MG tablet,  Take 1 tablet (40 mg total) by mouth daily., Disp: 90 tablet, Rfl: 3   vitamin E 1000 UNIT capsule, Take 1,000 Units by mouth daily., Disp: , Rfl:    traMADol (ULTRAM) 50 MG tablet, Take 100 mg by mouth 2 (two) times daily. (Patient not taking: Reported on 11/21/2023), Disp: , Rfl:    Subjective:   PATIENT ID: Michelle Newton GENDER: female DOB: 11-24-1961, MRN: [id number]  Chief Complaint  Patient presents with   Follow-up    Cough, shortness of breath and wheezing.     HPI  Patient is a pleasant 62 year old female with a past medical history of smoking who presents to clinic for the evaluation of a pulmonary nodule.  Patient was referred to and seen by cardiology for the chief complaint of chest discomfort.  She was also reporting discrepancy in blood pressure between her left and right arms.  Given history of smoking as well as previous history of PAD (left iliac stent previously placed), he was ordered a stress test which was noted for a left lower lobe nodule.  This was confirmed with a CT scan of the chest performed on 10/31/2023.  Patient is presenting today for evaluation and reports exertional dyspnea as well as a nonproductive cough.  She also reports an occasional wheeze that is worse at night.  Patient feels that her symptoms are worse over the past 3 months than they were previously.  She does not have any fevers, chills, night sweats, or weight loss.  Patient reports having been admitted to the hospital up in [place] for pneumonia at which point she was told she has asthma and COPD.  She was not previously seen by pulmonary nor has she had any pulmonary function testing.  She reports being currently maintained on Advair as well as Combivent with some relief.  Patient denies any occupational exposures, reports longstanding history of smoking.  She started smoking at age of 76, and currently smokes half a pack a day.  She likely has around 50 pack years of smoking history.  Past  medical history is significant for ankle fracture, PAD, asthma/COPD (presumed), and subclavian artery stenosis (left).  Ancillary information including prior medications, full medical/surgical/family/social histories, and PFTs (when available) are listed below and have been reviewed.   Review of Systems  Constitutional:  Negative for chills, fever and weight loss.  Respiratory:  Positive for cough, shortness of breath and wheezing. Negative for hemoptysis and sputum production.   Cardiovascular:  Negative for chest pain.     Objective:   Vitals:   11/21/23 0830  BP: 108/78  Pulse: 83  Temp: 97.6 F (36.4 C)  TempSrc: Temporal  SpO2: 93%  Weight: 130 lb (59 kg)   93% on RA BMI Readings from Last 3 Encounters:  11/21/23 25.82 kg/m  10/24/23 24.82 kg/m  09/22/23 24.78 kg/m   Wt Readings from Last 3 Encounters:  11/21/23 130 lb (59 kg)  10/24/23 125 lb (56.7 kg)  09/22/23 124 lb 12.8 oz (56.6 kg)    Physical Exam Constitutional:      Appearance: Normal appearance.  Cardiovascular:     Rate and Rhythm: Normal rate and regular rhythm.     Pulses: Normal pulses.     Heart sounds: Normal heart sounds.  Pulmonary:     Effort: Pulmonary effort is normal.     Breath sounds: Wheezing present. No rhonchi or rales.  Neurological:     General: No focal deficit present.     Mental Status: She is alert and oriented to person, place, and time. Mental status is at baseline.       Ancillary Information    Past Medical History:  Diagnosis Date   Anxiety    Asthma    CAD (coronary artery disease)    Chronic fatigue    Chronic thumb pain, bilateral 09/12/2015   COPD (chronic obstructive pulmonary disease) (HCC)    Coronary artery disease    Depression    DJD (degenerative joint disease)    DJD (degenerative joint disease)    Essential hypertension 09/12/2015   Family history of chronic pain 09/26/2015   Fibromyalgia    Fibromyalgia    Fracture five ribs-closed  09/12/2015   H/O acute myocardial infarction 12/14/2014   H/O non-insulin dependent diabetes mellitus 09/12/2015   History of cardiac arrhythmia 09/12/2015   History of elbow surgery 09/26/2015   Left ulnar nerve transposition   History of knee surgery 09/26/2015   History of migraine 09/12/2015   History of seizures 09/12/2015   Hypercholesteremia    MI (myocardial infarction) (HCC)  Migraine    Migraine    Osteoarthritis    PAD (peripheral artery disease) (HCC)    Right shoulder: Acute Pain due to trauma 04/18/2016   Right shoulder: Chronic Pain 09/12/2015   Seizures (HCC)    Ulcerative (chronic) enterocolitis (HCC)    Vertigo      Family History  Problem Relation Age of Onset   Heart disease Father    Heart disease Brother    Heart disease Maternal Aunt    Breast cancer Maternal Aunt    Heart disease Paternal Aunt    Breast cancer Cousin      Past Surgical History:  Procedure Laterality Date   ABDOMINAL AORTA STENT  2021   ABDOMINAL HYSTERECTOMY     BACK SURGERY     KNEE ARTHROPLASTY Left    left elbow surgery     LUMBAR DISC SURGERY     ROTATOR CUFF REPAIR Right    TRIGGER FINGER RELEASE Left    ureter mesh      Social History   Socioeconomic History   Marital status: Single    Spouse name: Not on file   Number of children: Not on file   Years of education: Not on file   Highest education level: Not on file  Occupational History   Not on file  Tobacco Use   Smoking status: Every Day    Current packs/day: 0.25    Average packs/day: 0.3 packs/day for 48.0 years (12.0 ttl pk-yrs)    Types: Cigarettes    Start date: 35   Smokeless tobacco: Never  Vaping Use   Vaping status: Never Used  Substance and Sexual Activity   Alcohol use: No   Drug use: No   Sexual activity: Not Currently    Birth control/protection: Surgical  Other Topics Concern   Not on file  Social History Narrative   Not on file   Social Drivers of Health   Financial  Resource Strain: Not on file  Food Insecurity: Not on file  Transportation Needs: Not on file  Physical Activity: Not on file  Stress: Not on file  Social Connections: Not on file  Intimate Partner Violence: Not on file     Allergies  Allergen Reactions   Cymbalta [Duloxetine Hcl] Other (See Comments)    dizzy   Duloxetine     Other reaction(s): Dizziness   Flagyl [Metronidazole] Other (See Comments)    Other reaction(s): Other (See Comments) dizzy   Gabapentin Other (See Comments)   Lyrica [Pregabalin] Other (See Comments)    Other reaction(s): Dizziness Caused eye damage "It did damage to my eyes"   Vicodin [Hydrocodone-Acetaminophen] Nausea And Vomiting    Other reaction(s): Vomiting   Nsaids Other (See Comments) and Rash    bleeding   Silicone Rash   Tape Rash     CBC    Component Value Date/Time   WBC 8.5 04/18/2023 1114   WBC 9.3 12/22/2015 1124   RBC 4.41 04/18/2023 1114   RBC 4.38 12/22/2015 1124   HGB 15.1 04/18/2023 1114   HCT 44.1 04/18/2023 1114   PLT 309 12/22/2015 1124   PLT 356 10/26/2013 1938   MCV 100 (H) 04/18/2023 1114   MCV 102 (H) 10/26/2013 1938   MCH 34.2 (H) 04/18/2023 1114   MCH 33.7 12/22/2015 1124   MCHC 34.2 04/18/2023 1114   MCHC 33.4 12/22/2015 1124   RDW 12.6 04/18/2023 1114   RDW 14.1 10/26/2013 1938   LYMPHSABS 2.7 04/18/2023 1114  LYMPHSABS 4.8 (H) 10/26/2013 1938   MONOABS 0.6 09/11/2015 0254   MONOABS 0.7 10/26/2013 1938   EOSABS 0.2 04/18/2023 1114   EOSABS 0.0 10/26/2013 1938   BASOSABS 0.1 04/18/2023 1114   BASOSABS 0.1 10/26/2013 1938    Pulmonary Functions Testing Results:     No data to display          Outpatient Medications Prior to Visit  Medication Sig Dispense Refill   ADVAIR DISKUS 500-50 MCG/DOSE AEPB      albuterol (PROVENTIL) (2.5 MG/3ML) 0.083% nebulizer solution as needed.      albuterol-ipratropium (COMBIVENT) 18-103 MCG/ACT inhaler Inhale 2 puffs into the lungs every 6 (six) hours as needed  for wheezing or shortness of breath.     aspirin EC 81 MG tablet Take 81 mg by mouth daily.     azelastine (OPTIVAR) 0.05 % ophthalmic solution Place 1 drop into both eyes daily.     bisoprolol-hydrochlorothiazide (ZIAC) 5-6.25 MG tablet Take 0.5 tablets by mouth daily.     butalbital-acetaminophen-caffeine (FIORICET, ESGIC) 50-325-40 MG tablet Take 1 tablet by mouth 2 (two) times daily.     Calcium Carbonate-Vit D-Min (CALCIUM 1200 PO) Take by mouth daily.     Cholecalciferol (VITAMIN D) 2000 UNITS tablet Take 1 tablet (2,000 Units total) by mouth daily. (Patient taking differently: Take 4,000 Units by mouth daily.) 30 tablet PRN   COMBIVENT RESPIMAT 20-100 MCG/ACT AERS respimat Inhale 1 puff into the lungs 2 (two) times daily.  1   cyanocobalamin (,VITAMIN B-12,) 1000 MCG/ML injection Every 3 weeks     cyclobenzaprine (FLEXERIL) 10 MG tablet Take 1 tablet (10 mg total) by mouth at bedtime. 30 tablet 0   dicyclomine (BENTYL) 10 MG capsule take 1 capsule by mouth four times a day if needed for ABDOMINAL SPASM  0   estradiol (CLIMARA - DOSED IN MG/24 HR) 0.1 mg/24hr patch Place 0.1 mg onto the skin once a week. On Wednesday     Fluticasone-Salmeterol (ADVAIR) 100-50 MCG/DOSE AEPB Inhale 1 puff into the lungs 2 (two) times daily.     hydrochlorothiazide (HYDRODIURIL) 12.5 MG tablet      HYDROcodone-acetaminophen (NORCO) 10-325 MG tablet Take 1 tablet by mouth 3 (three) times daily as needed.     Magnesium 400 MG TABS Take 1 tablet by mouth 2 (two) times daily.      meclizine (ANTIVERT) 25 MG tablet Take 25 mg by mouth 3 (three) times daily as needed.     nitroGLYCERIN (NITROSTAT) 0.4 MG SL tablet Place 0.4 mg under the tongue every 5 (five) minutes as needed for chest pain.     PARoxetine (PAXIL) 30 MG tablet Take 30 mg by mouth daily.     Potassium 95 MG TABS Take 1 tablet by mouth daily.     rosuvastatin (CRESTOR) 40 MG tablet Take 1 tablet (40 mg total) by mouth daily. 90 tablet 3   vitamin E  1000 UNIT capsule Take 1,000 Units by mouth daily.     traMADol (ULTRAM) 50 MG tablet Take 100 mg by mouth 2 (two) times daily. (Patient not taking: Reported on 11/21/2023)     Cinnamon ([employer] CINNAMON) 500 MG capsule Take 500 mg by mouth daily.     diazepam (VALIUM) 5 MG tablet Take 2.5 mg by mouth every 8 (eight) hours as needed for anxiety (takes 1/2 - 1 tablet as needed for anxiety).  (Patient not taking: Reported on 09/22/2023)     magnesium oxide (MAG-OX) 400 MG tablet Take  1 tablet by mouth 2 (two) times daily. (Patient not taking: Reported on 09/22/2023)  0   metoprolol tartrate (LOPRESSOR) 25 MG tablet 25 mg 2 (two) times daily.     promethazine (PHENERGAN) 25 MG tablet Take 25 mg by mouth every 6 (six) hours as needed for nausea or vomiting. (Patient not taking: Reported on 10/24/2023)     promethazine-dextromethorphan (PROMETHAZINE-DM) 6.25-15 MG/5ML syrup  (Patient not taking: Reported on 11/21/2023)  0   No facility-administered medications prior to visit.

## 2023-11-21 NOTE — Progress Notes (Signed)
Synopsis: Referred in for pulmonary nodule by Fransico Michael, Cadence H, PA-C  Assessment & Plan:   1. Lung nodule (Primary)  Nodule Location: LLL Nodule Size: 11 mm Nodule Spiculation: No Associated Lymphadenopathy: No Smoking Status (current) and pack years: 50 Extrathoracic cancer > 5 years prior (no) SPN malignancy risk score [facility]): 9.5 % risk of malignancy ECOG: 0  The patient is here to discuss their imaging abnormalities which include a nodule noted incidentally on her stress test and confirmed on CT. Discussed with the patient that statistically the most likely cause behind such nodules is benign/infectious/inflammatory. Also discussed risk stratification, with [facility] SPN model giving risk of malignancy at 9.5%. We will obtain a repeat CT scan in one month's time (at the 2 month mark), if the nodule persists, we will proceed with biopsy.  We discussed the importance of diagnosis and staging in lung malignancies, and the approach to obtaining a tissue diagnosis which would include robotic assisted navigational bronchoscopy with endobronchial ultrasound guided sampling.  We also discussed the risks associated with the procedure which include a 2% risk of pneumothorax, infection, bleeding, and nondiagnostic procedure in detail.  I explained that patients typically are able to return home the same day of the procedure, but in rare cases admission to the hospital for observation and treatment is required.  After our discussion, the patient elected to proceed with the procedure  - CT SUPER D CHEST WO CONTRAST; Future - Procedural/ Surgical Case Request: ROBOTIC ASSISTED NAVIGATIONAL BRONCHOSCOPY; Future  2. Mild intermittent asthma without complication  She is reporting an occasional wheeze and cough that is non-productive. She was told she had asthma in [place], and is currently on Advair and Combivent. Could certainly have COPD given significant smoking history. Will obtain  PFT's and send a course of azithromycin. Will decide on adding LAMA therapy at follow up pending PFT's. Patient encouraged to quit smoking.  - Pulmonary Function Test [facility] Only; Future - azithromycin (ZITHROMAX) 250 MG tablet; Take 2 tablets (500 mg) on  Day 1,  followed by 1 tablet (250 mg) once daily on Days 2 through 5.  Dispense: 6 each; Refill: 0   Return in about 3 months (around 02/19/2024).  I spent 60 minutes caring for this patient today, including preparing to see the patient, obtaining a medical history , reviewing a separately obtained history, performing a medically appropriate examination and/or evaluation, counseling and educating the patient/family/caregiver, ordering medications, tests, or procedures, documenting clinical information in the electronic health record, and independently interpreting results (not separately reported/billed) and communicating results to the patient/family/caregiver  Raechel Chute, MD  Pulmonary Critical Care 11/21/2023 9:07 AM    End of visit medications:  Meds ordered this encounter  Medications   azithromycin (ZITHROMAX) 250 MG tablet    Sig: Take 2 tablets (500 mg) on  Day 1,  followed by 1 tablet (250 mg) once daily on Days 2 through 5.    Dispense:  6 each    Refill:  0     Current Outpatient Medications:    ADVAIR DISKUS 500-50 MCG/DOSE AEPB, , Disp: , Rfl:    albuterol (PROVENTIL) (2.5 MG/3ML) 0.083% nebulizer solution, as needed. , Disp: , Rfl:    albuterol-ipratropium (COMBIVENT) 18-103 MCG/ACT inhaler, Inhale 2 puffs into the lungs every 6 (six) hours as needed for wheezing or shortness of breath., Disp: , Rfl:    aspirin EC 81 MG tablet, Take 81 mg by mouth daily., Disp: , Rfl:  azelastine (OPTIVAR) 0.05 % ophthalmic solution, Place 1 drop into both eyes daily., Disp: , Rfl:    azithromycin (ZITHROMAX) 250 MG tablet, Take 2 tablets (500 mg) on  Day 1,  followed by 1 tablet (250 mg) once daily on Days 2 through 5., Disp:  6 each, Rfl: 0   bisoprolol-hydrochlorothiazide (ZIAC) 5-6.25 MG tablet, Take 0.5 tablets by mouth daily., Disp: , Rfl:    butalbital-acetaminophen-caffeine (FIORICET, ESGIC) 50-325-40 MG tablet, Take 1 tablet by mouth 2 (two) times daily., Disp: , Rfl:    Calcium Carbonate-Vit D-Min (CALCIUM 1200 PO), Take by mouth daily., Disp: , Rfl:    Cholecalciferol (VITAMIN D) 2000 UNITS tablet, Take 1 tablet (2,000 Units total) by mouth daily. (Patient taking differently: Take 4,000 Units by mouth daily.), Disp: 30 tablet, Rfl: PRN   COMBIVENT RESPIMAT 20-100 MCG/ACT AERS respimat, Inhale 1 puff into the lungs 2 (two) times daily., Disp: , Rfl: 1   cyanocobalamin (,VITAMIN B-12,) 1000 MCG/ML injection, Every 3 weeks, Disp: , Rfl:    cyclobenzaprine (FLEXERIL) 10 MG tablet, Take 1 tablet (10 mg total) by mouth at bedtime., Disp: 30 tablet, Rfl: 0   dicyclomine (BENTYL) 10 MG capsule, take 1 capsule by mouth four times a day if needed for ABDOMINAL SPASM, Disp: , Rfl: 0   estradiol (CLIMARA - DOSED IN MG/24 HR) 0.1 mg/24hr patch, Place 0.1 mg onto the skin once a week. On Wednesday, Disp: , Rfl:    Fluticasone-Salmeterol (ADVAIR) 100-50 MCG/DOSE AEPB, Inhale 1 puff into the lungs 2 (two) times daily., Disp: , Rfl:    hydrochlorothiazide (HYDRODIURIL) 12.5 MG tablet, , Disp: , Rfl:    HYDROcodone-acetaminophen (NORCO) 10-325 MG tablet, Take 1 tablet by mouth 3 (three) times daily as needed., Disp: , Rfl:    Magnesium 400 MG TABS, Take 1 tablet by mouth 2 (two) times daily. , Disp: , Rfl:    meclizine (ANTIVERT) 25 MG tablet, Take 25 mg by mouth 3 (three) times daily as needed., Disp: , Rfl:    nitroGLYCERIN (NITROSTAT) 0.4 MG SL tablet, Place 0.4 mg under the tongue every 5 (five) minutes as needed for chest pain., Disp: , Rfl:    PARoxetine (PAXIL) 30 MG tablet, Take 30 mg by mouth daily., Disp: , Rfl:    Potassium 95 MG TABS, Take 1 tablet by mouth daily., Disp: , Rfl:    rosuvastatin (CRESTOR) 40 MG tablet,  Take 1 tablet (40 mg total) by mouth daily., Disp: 90 tablet, Rfl: 3   vitamin E 1000 UNIT capsule, Take 1,000 Units by mouth daily., Disp: , Rfl:    traMADol (ULTRAM) 50 MG tablet, Take 100 mg by mouth 2 (two) times daily. (Patient not taking: Reported on 11/21/2023), Disp: , Rfl:    Subjective:   PATIENT ID: Michelle Newton GENDER: female DOB: 11-24-1961, MRN: [id number]  Chief Complaint  Patient presents with   Follow-up    Cough, shortness of breath and wheezing.     HPI  Patient is a pleasant 62 year old female with a past medical history of smoking who presents to clinic for the evaluation of a pulmonary nodule.  Patient was referred to and seen by cardiology for the chief complaint of chest discomfort.  She was also reporting discrepancy in blood pressure between her left and right arms.  Given history of smoking as well as previous history of PAD (left iliac stent previously placed), he was ordered a stress test which was noted for a left lower lobe nodule.  This was confirmed with a CT scan of the chest performed on 10/31/2023.  Patient is presenting today for evaluation and reports exertional dyspnea as well as a nonproductive cough.  She also reports an occasional wheeze that is worse at night.  Patient feels that her symptoms are worse over the past 3 months than they were previously.  She does not have any fevers, chills, night sweats, or weight loss.  Patient reports having been admitted to the hospital up in [place] for pneumonia at which point she was told she has asthma and COPD.  She was not previously seen by pulmonary nor has she had any pulmonary function testing.  She reports being currently maintained on Advair as well as Combivent with some relief.  Patient denies any occupational exposures, reports longstanding history of smoking.  She started smoking at age of 76, and currently smokes half a pack a day.  She likely has around 50 pack years of smoking history.  Past  medical history is significant for ankle fracture, PAD, asthma/COPD (presumed), and subclavian artery stenosis (left).  Ancillary information including prior medications, full medical/surgical/family/social histories, and PFTs (when available) are listed below and have been reviewed.   Review of Systems  Constitutional:  Negative for chills, fever and weight loss.  Respiratory:  Positive for cough, shortness of breath and wheezing. Negative for hemoptysis and sputum production.   Cardiovascular:  Negative for chest pain.     Objective:   Vitals:   11/21/23 0830  BP: 108/78  Pulse: 83  Temp: 97.6 F (36.4 C)  TempSrc: Temporal  SpO2: 93%  Weight: 130 lb (59 kg)   93% on RA BMI Readings from Last 3 Encounters:  11/21/23 25.82 kg/m  10/24/23 24.82 kg/m  09/22/23 24.78 kg/m   Wt Readings from Last 3 Encounters:  11/21/23 130 lb (59 kg)  10/24/23 125 lb (56.7 kg)  09/22/23 124 lb 12.8 oz (56.6 kg)    Physical Exam Constitutional:      Appearance: Normal appearance.  Cardiovascular:     Rate and Rhythm: Normal rate and regular rhythm.     Pulses: Normal pulses.     Heart sounds: Normal heart sounds.  Pulmonary:     Effort: Pulmonary effort is normal.     Breath sounds: Wheezing present. No rhonchi or rales.  Neurological:     General: No focal deficit present.     Mental Status: She is alert and oriented to person, place, and time. Mental status is at baseline.       Ancillary Information    Past Medical History:  Diagnosis Date   Anxiety    Asthma    CAD (coronary artery disease)    Chronic fatigue    Chronic thumb pain, bilateral 09/12/2015   COPD (chronic obstructive pulmonary disease) (HCC)    Coronary artery disease    Depression    DJD (degenerative joint disease)    DJD (degenerative joint disease)    Essential hypertension 09/12/2015   Family history of chronic pain 09/26/2015   Fibromyalgia    Fibromyalgia    Fracture five ribs-closed  09/12/2015   H/O acute myocardial infarction 12/14/2014   H/O non-insulin dependent diabetes mellitus 09/12/2015   History of cardiac arrhythmia 09/12/2015   History of elbow surgery 09/26/2015   Left ulnar nerve transposition   History of knee surgery 09/26/2015   History of migraine 09/12/2015   History of seizures 09/12/2015   Hypercholesteremia    MI (myocardial infarction) (HCC)  Migraine    Migraine    Osteoarthritis    PAD (peripheral artery disease) (HCC)    Right shoulder: Acute Pain due to trauma 04/18/2016   Right shoulder: Chronic Pain 09/12/2015   Seizures (HCC)    Ulcerative (chronic) enterocolitis (HCC)    Vertigo      Family History  Problem Relation Age of Onset   Heart disease Father    Heart disease Brother    Heart disease Maternal Aunt    Breast cancer Maternal Aunt    Heart disease Paternal Aunt    Breast cancer Cousin      Past Surgical History:  Procedure Laterality Date   ABDOMINAL AORTA STENT  2021   ABDOMINAL HYSTERECTOMY     BACK SURGERY     KNEE ARTHROPLASTY Left    left elbow surgery     LUMBAR DISC SURGERY     ROTATOR CUFF REPAIR Right    TRIGGER FINGER RELEASE Left    ureter mesh      Social History   Socioeconomic History   Marital status: Single    Spouse name: Not on file   Number of children: Not on file   Years of education: Not on file   Highest education level: Not on file  Occupational History   Not on file  Tobacco Use   Smoking status: Every Day    Current packs/day: 0.25    Average packs/day: 0.3 packs/day for 48.0 years (12.0 ttl pk-yrs)    Types: Cigarettes    Start date: 35   Smokeless tobacco: Never  Vaping Use   Vaping status: Never Used  Substance and Sexual Activity   Alcohol use: No   Drug use: No   Sexual activity: Not Currently    Birth control/protection: Surgical  Other Topics Concern   Not on file  Social History Narrative   Not on file   Social Drivers of Health   Financial  Resource Strain: Not on file  Food Insecurity: Not on file  Transportation Needs: Not on file  Physical Activity: Not on file  Stress: Not on file  Social Connections: Not on file  Intimate Partner Violence: Not on file     Allergies  Allergen Reactions   Cymbalta [Duloxetine Hcl] Other (See Comments)    dizzy   Duloxetine     Other reaction(s): Dizziness   Flagyl [Metronidazole] Other (See Comments)    Other reaction(s): Other (See Comments) dizzy   Gabapentin Other (See Comments)   Lyrica [Pregabalin] Other (See Comments)    Other reaction(s): Dizziness Caused eye damage "It did damage to my eyes"   Vicodin [Hydrocodone-Acetaminophen] Nausea And Vomiting    Other reaction(s): Vomiting   Nsaids Other (See Comments) and Rash    bleeding   Silicone Rash   Tape Rash     CBC    Component Value Date/Time   WBC 8.5 04/18/2023 1114   WBC 9.3 12/22/2015 1124   RBC 4.41 04/18/2023 1114   RBC 4.38 12/22/2015 1124   HGB 15.1 04/18/2023 1114   HCT 44.1 04/18/2023 1114   PLT 309 12/22/2015 1124   PLT 356 10/26/2013 1938   MCV 100 (H) 04/18/2023 1114   MCV 102 (H) 10/26/2013 1938   MCH 34.2 (H) 04/18/2023 1114   MCH 33.7 12/22/2015 1124   MCHC 34.2 04/18/2023 1114   MCHC 33.4 12/22/2015 1124   RDW 12.6 04/18/2023 1114   RDW 14.1 10/26/2013 1938   LYMPHSABS 2.7 04/18/2023 1114  LYMPHSABS 4.8 (H) 10/26/2013 1938   MONOABS 0.6 09/11/2015 0254   MONOABS 0.7 10/26/2013 1938   EOSABS 0.2 04/18/2023 1114   EOSABS 0.0 10/26/2013 1938   BASOSABS 0.1 04/18/2023 1114   BASOSABS 0.1 10/26/2013 1938    Pulmonary Functions Testing Results:     No data to display          Outpatient Medications Prior to Visit  Medication Sig Dispense Refill   ADVAIR DISKUS 500-50 MCG/DOSE AEPB      albuterol (PROVENTIL) (2.5 MG/3ML) 0.083% nebulizer solution as needed.      albuterol-ipratropium (COMBIVENT) 18-103 MCG/ACT inhaler Inhale 2 puffs into the lungs every 6 (six) hours as needed  for wheezing or shortness of breath.     aspirin EC 81 MG tablet Take 81 mg by mouth daily.     azelastine (OPTIVAR) 0.05 % ophthalmic solution Place 1 drop into both eyes daily.     bisoprolol-hydrochlorothiazide (ZIAC) 5-6.25 MG tablet Take 0.5 tablets by mouth daily.     butalbital-acetaminophen-caffeine (FIORICET, ESGIC) 50-325-40 MG tablet Take 1 tablet by mouth 2 (two) times daily.     Calcium Carbonate-Vit D-Min (CALCIUM 1200 PO) Take by mouth daily.     Cholecalciferol (VITAMIN D) 2000 UNITS tablet Take 1 tablet (2,000 Units total) by mouth daily. (Patient taking differently: Take 4,000 Units by mouth daily.) 30 tablet PRN   COMBIVENT RESPIMAT 20-100 MCG/ACT AERS respimat Inhale 1 puff into the lungs 2 (two) times daily.  1   cyanocobalamin (,VITAMIN B-12,) 1000 MCG/ML injection Every 3 weeks     cyclobenzaprine (FLEXERIL) 10 MG tablet Take 1 tablet (10 mg total) by mouth at bedtime. 30 tablet 0   dicyclomine (BENTYL) 10 MG capsule take 1 capsule by mouth four times a day if needed for ABDOMINAL SPASM  0   estradiol (CLIMARA - DOSED IN MG/24 HR) 0.1 mg/24hr patch Place 0.1 mg onto the skin once a week. On Wednesday     Fluticasone-Salmeterol (ADVAIR) 100-50 MCG/DOSE AEPB Inhale 1 puff into the lungs 2 (two) times daily.     hydrochlorothiazide (HYDRODIURIL) 12.5 MG tablet      HYDROcodone-acetaminophen (NORCO) 10-325 MG tablet Take 1 tablet by mouth 3 (three) times daily as needed.     Magnesium 400 MG TABS Take 1 tablet by mouth 2 (two) times daily.      meclizine (ANTIVERT) 25 MG tablet Take 25 mg by mouth 3 (three) times daily as needed.     nitroGLYCERIN (NITROSTAT) 0.4 MG SL tablet Place 0.4 mg under the tongue every 5 (five) minutes as needed for chest pain.     PARoxetine (PAXIL) 30 MG tablet Take 30 mg by mouth daily.     Potassium 95 MG TABS Take 1 tablet by mouth daily.     rosuvastatin (CRESTOR) 40 MG tablet Take 1 tablet (40 mg total) by mouth daily. 90 tablet 3   vitamin E  1000 UNIT capsule Take 1,000 Units by mouth daily.     traMADol (ULTRAM) 50 MG tablet Take 100 mg by mouth 2 (two) times daily. (Patient not taking: Reported on 11/21/2023)     Cinnamon ([employer] CINNAMON) 500 MG capsule Take 500 mg by mouth daily.     diazepam (VALIUM) 5 MG tablet Take 2.5 mg by mouth every 8 (eight) hours as needed for anxiety (takes 1/2 - 1 tablet as needed for anxiety).  (Patient not taking: Reported on 09/22/2023)     magnesium oxide (MAG-OX) 400 MG tablet Take  1 tablet by mouth 2 (two) times daily. (Patient not taking: Reported on 09/22/2023)  0   metoprolol tartrate (LOPRESSOR) 25 MG tablet 25 mg 2 (two) times daily.     promethazine (PHENERGAN) 25 MG tablet Take 25 mg by mouth every 6 (six) hours as needed for nausea or vomiting. (Patient not taking: Reported on 10/24/2023)     promethazine-dextromethorphan (PROMETHAZINE-DM) 6.25-15 MG/5ML syrup  (Patient not taking: Reported on 11/21/2023)  0   No facility-administered medications prior to visit.

## 2023-11-28 ENCOUNTER — Ambulatory Visit: Payer: 59 | Attending: Student in an Organized Health Care Education/Training Program

## 2023-11-28 DIAGNOSIS — J4520 Mild intermittent asthma, uncomplicated: Secondary | ICD-10-CM | POA: Diagnosis present

## 2023-11-28 DIAGNOSIS — R0609 Other forms of dyspnea: Secondary | ICD-10-CM | POA: Diagnosis not present

## 2023-11-28 DIAGNOSIS — F17210 Nicotine dependence, cigarettes, uncomplicated: Secondary | ICD-10-CM | POA: Diagnosis not present

## 2023-11-28 LAB — PULMONARY FUNCTION TEST ARMC ONLY
DL/VA % pred: 76 %
DL/VA: 3.35 ml/min/mmHg/L
DLCO unc % pred: 67 %
DLCO unc: 11.67 ml/min/mmHg
FEF 25-75 Post: 1.63 L/s
FEF 25-75 Pre: 2.37 L/s
FEF2575-%Change-Post: -31 %
FEF2575-%Pred-Post: 79 %
FEF2575-%Pred-Pre: 115 %
FEV1-%Change-Post: -7 %
FEV1-%Pred-Post: 78 %
FEV1-%Pred-Pre: 84 %
FEV1-Post: 1.66 L
FEV1-Pre: 1.78 L
FEV1FVC-%Change-Post: -7 %
FEV1FVC-%Pred-Pre: 110 %
FEV6-%Change-Post: 0 %
FEV6-%Pred-Post: 78 %
FEV6-%Pred-Pre: 78 %
FEV6-Post: 2.09 L
FEV6-Pre: 2.07 L
FEV6FVC-%Pred-Post: 103 %
FEV6FVC-%Pred-Pre: 103 %
FVC-%Change-Post: 0 %
FVC-%Pred-Post: 75 %
FVC-%Pred-Pre: 75 %
FVC-Post: 2.09 L
FVC-Pre: 2.07 L
Post FEV1/FVC ratio: 79 %
Post FEV6/FVC ratio: 100 %
Pre FEV1/FVC ratio: 86 %
Pre FEV6/FVC Ratio: 100 %
RV % pred: 97 %
RV: 1.74 L
TLC % pred: 94 %
TLC: 4.14 L

## 2023-11-28 MED ORDER — ALBUTEROL SULFATE (2.5 MG/3ML) 0.083% IN NEBU
2.5000 mg | INHALATION_SOLUTION | Freq: Once | RESPIRATORY_TRACT | Status: AC
  Administered 2023-11-28: 2.5 mg via RESPIRATORY_TRACT
  Filled 2023-11-28: qty 3

## 2023-12-16 ENCOUNTER — Ambulatory Visit
Admission: RE | Admit: 2023-12-16 | Discharge: 2023-12-16 | Disposition: A | Discharge status: Home / Self Care | Payer: 59 | Source: Ambulatory Visit | Attending: Student in an Organized Health Care Education/Training Program | Admitting: Student in an Organized Health Care Education/Training Program

## 2023-12-16 DIAGNOSIS — R911 Solitary pulmonary nodule: Secondary | ICD-10-CM | POA: Diagnosis present

## 2023-12-16 NOTE — Progress Notes (Signed)
 PCP -   Derick Leita POUR, MD    Cardiologist - Redell Cave, MD   PPM/ICD -  Device Orders -  Rep Notified -   Chest x-ray -  EKG - 09-22-23 Stress Test - 10-24-23 ECHO -  Cardiac Cath - 10-08-23 Chest CT 12-16-23 CPAP -   GLP-1 -  Fasting Blood Sugar -  Checks Blood Sugar /day  Blood Thinner Instructions:  Aspirin Instructions: aspirin EC   ERAS Protcol -   COVID TEST-   Anesthesia review:   Patient verbally denies any shortness of breath, fever, cough and chest pain during phone call   -------------  SDW INSTRUCTIONS given:  Your procedure is scheduled on .  Report to [facility] Main Entrance A at  A.M., and check in at the Admitting office.  Call this number if you have problems the morning of surgery:  [phone]   Remember:  Do not eat or drinks after midnight the night before your surgery    Take these medicines the morning of surgery with A SIP OF WATER  PARoxetine (PAXIL)  ADVAIR  rosuvastatin  (CRESTOR )  azelastine (OPTIVAR)   IF NEEDED meclizine (ANTIVERT)  nitroGLYCERIN (NITROSTAT)  albuterol  (PROVENTIL ) nebulizer solution  albuterol -ipratropium (COMBIVENT)    As of today, STOP taking any Aspirin (unless otherwise instructed by your surgeon) Aleve, Naproxen, Ibuprofen, Motrin, Advil, Goody's, BC's, all herbal medications, fish oil, and all vitamins.                      Do not wear jewelry, make up, or nail polish            Do not wear lotions, powders, perfumes/colognes, or deodorant.            Do not shave 48 hours prior to surgery.  Men may shave face and neck.            Do not bring valuables to the hospital.            [facility] is not responsible for any belongings or valuables.  Do NOT Smoke (Tobacco/Vaping) 24 hours prior to your procedure If you use a CPAP at night, you may bring all equipment for your overnight stay.   Contacts, glasses, dentures or bridgework may not be worn into surgery.      For patients admitted  to the hospital, discharge time will be determined by your treatment team.   Patients discharged the day of surgery will not be allowed to drive home, and someone needs to stay with them for 24 hours.    Special instructions:   [place]- Preparing For Surgery  Before surgery, you can play an important role. Because skin is not sterile, your skin needs to be as free of germs as possible. You can reduce the number of germs on your skin by washing with CHG (chlorahexidine gluconate) Soap before surgery.  CHG is an antiseptic cleaner which kills germs and bonds with the skin to continue killing germs even after washing.    Oral Hygiene is also important to reduce your risk of infection.  Remember - BRUSH YOUR TEETH THE MORNING OF SURGERY WITH YOUR REGULAR TOOTHPASTE  Please do not use if you have an allergy to CHG or antibacterial soaps. If your skin becomes reddened/irritated stop using the CHG.  Do not shave (including legs and underarms) for at least 48 hours prior to first CHG shower. It is OK to shave your face.  Please follow these  instructions carefully.   Shower the NIGHT BEFORE SURGERY and the MORNING OF SURGERY with DIAL Soap.   Pat yourself dry with a CLEAN TOWEL.  Wear CLEAN PAJAMAS to bed the night before surgery  Place CLEAN SHEETS on your bed the night of your first shower and DO NOT SLEEP WITH PETS.   Day of Surgery: Please shower morning of surgery  Wear Clean/Comfortable clothing the morning of surgery Do not apply any deodorants/lotions.   Remember to brush your teeth WITH YOUR REGULAR TOOTHPASTE.   Questions were answered. Patient verbalized understanding of instructions.

## 2023-12-17 ENCOUNTER — Encounter (HOSPITAL_COMMUNITY): Payer: Self-pay | Admitting: Student in an Organized Health Care Education/Training Program

## 2023-12-17 ENCOUNTER — Other Ambulatory Visit: Payer: Self-pay

## 2023-12-17 NOTE — Progress Notes (Signed)
 PCP - Claudine Cullens, MD  Cardiologist - Constancia Delton, MD   PPM/ICD - denies Device Orders - n/a Rep Notified - n/a  Chest x-ray -  EKG - 09-22-23 Stress Test - 10-24-23 ECHO -  Cardiac Cath - 10-08-23 Chest CT 12-16-23   CPAP - denies  Dm -denies  Blood Thinner Instructions: denies Aspirin Instructions: aspirin EC Per patient not taking any am meds tomorrow  ERAS Protcol - npo  COVID TEST- n/a  Anesthesia review: no  Patient verbally denies any shortness of breath, fever, cough and chest pain during phone call   -------------  SDW INSTRUCTIONS given:  Your procedure is scheduled on January 16,2025.  Report to [facility] Main Entrance "A" at 6:30 A.M., and check in at the Admitting office.  Call this number if you have problems the morning of surgery:  [phone]   Remember:  Do not eat or drink after midnight the night before your surgery    Take these medicines the morning of surgery with  SIP OF WATER PARoxetine (PAXIL)  ADVAIR  rosuvastatin  (CRESTOR )  azelastine (OPTIVAR)    IF NEEDED meclizine (ANTIVERT)  nitroGLYCERIN (NITROSTAT)  albuterol  (PROVENTIL ) nebulizer solution  albuterol -ipratropium (COMBIVENT)  (NORCO)    As of today, STOP taking any Aspirin (unless otherwise instructed by your surgeon) Aleve, Naproxen, Ibuprofen, Motrin, Advil, Goody's, BC's, all herbal medications, fish oil, and all vitamins.                      Do not wear jewelry, make up, or nail polish            Do not wear lotions, powders, perfumes/colognes, or deodorant.            Do not shave 48 hours prior to surgery.  Men may shave face and neck.            Do not bring valuables to the hospital.            [facility] is not responsible for any belongings or valuables.  Do NOT Smoke (Tobacco/Vaping) 24 hours prior to your procedure If you use a CPAP at night, you may bring all equipment for your overnight stay.   Contacts, glasses, dentures or bridgework may not  be worn into surgery.      For patients admitted to the hospital, discharge time will be determined by your treatment team.   Patients discharged the day of surgery will not be allowed to drive home, and someone needs to stay with them for 24 hours.    Special instructions:   [place]- Preparing For Surgery  Before surgery, you can play an important role. Because skin is not sterile, your skin needs to be as free of germs as possible. You can reduce the number of germs on your skin by washing with CHG (chlorahexidine gluconate) Soap before surgery.  CHG is an antiseptic cleaner which kills germs and bonds with the skin to continue killing germs even after washing.    Oral Hygiene is also important to reduce your risk of infection.  Remember - BRUSH YOUR TEETH THE MORNING OF SURGERY WITH YOUR REGULAR TOOTHPASTE  Please do not use if you have an allergy to CHG or antibacterial soaps. If your skin becomes reddened/irritated stop using the CHG.  Do not shave (including legs and underarms) for at least 48 hours prior to first CHG shower. It is OK to shave your face.  Please follow these instructions carefully.  Shower the NIGHT BEFORE SURGERY and the MORNING OF SURGERY with DIAL Soap.   Pat yourself dry with a CLEAN TOWEL.  Wear CLEAN PAJAMAS to bed the night before surgery  Place CLEAN SHEETS on your bed the night of your first shower and DO NOT SLEEP WITH PETS.   Day of Surgery: Please shower morning of surgery  Wear Clean/Comfortable clothing the morning of surgery Do not apply any deodorants/lotions.   Remember to brush your teeth WITH YOUR REGULAR TOOTHPASTE.   Questions were answered. Patient verbalized understanding of instructions.

## 2023-12-18 ENCOUNTER — Ambulatory Visit (HOSPITAL_COMMUNITY): Payer: 59

## 2023-12-18 ENCOUNTER — Ambulatory Visit (HOSPITAL_BASED_OUTPATIENT_CLINIC_OR_DEPARTMENT_OTHER): Payer: 59

## 2023-12-18 ENCOUNTER — Other Ambulatory Visit: Payer: Self-pay

## 2023-12-18 ENCOUNTER — Encounter (HOSPITAL_COMMUNITY)
Admission: RE | Disposition: A | Discharge status: Home / Self Care | Payer: Self-pay | Source: Home / Self Care | Attending: Student in an Organized Health Care Education/Training Program

## 2023-12-18 ENCOUNTER — Encounter (HOSPITAL_COMMUNITY): Payer: Self-pay | Admitting: Student in an Organized Health Care Education/Training Program

## 2023-12-18 ENCOUNTER — Ambulatory Visit (HOSPITAL_COMMUNITY)
Admission: RE | Admit: 2023-12-18 | Discharge: 2023-12-18 | Disposition: A | Discharge status: Home / Self Care | Payer: 59 | Attending: Student in an Organized Health Care Education/Training Program | Admitting: Student in an Organized Health Care Education/Training Program

## 2023-12-18 DIAGNOSIS — I2510 Atherosclerotic heart disease of native coronary artery without angina pectoris: Secondary | ICD-10-CM | POA: Insufficient documentation

## 2023-12-18 DIAGNOSIS — R911 Solitary pulmonary nodule: Secondary | ICD-10-CM | POA: Insufficient documentation

## 2023-12-18 DIAGNOSIS — I10 Essential (primary) hypertension: Secondary | ICD-10-CM | POA: Diagnosis not present

## 2023-12-18 DIAGNOSIS — I739 Peripheral vascular disease, unspecified: Secondary | ICD-10-CM | POA: Insufficient documentation

## 2023-12-18 DIAGNOSIS — F17210 Nicotine dependence, cigarettes, uncomplicated: Secondary | ICD-10-CM | POA: Diagnosis not present

## 2023-12-18 DIAGNOSIS — F418 Other specified anxiety disorders: Secondary | ICD-10-CM

## 2023-12-18 DIAGNOSIS — E119 Type 2 diabetes mellitus without complications: Secondary | ICD-10-CM | POA: Insufficient documentation

## 2023-12-18 DIAGNOSIS — I252 Old myocardial infarction: Secondary | ICD-10-CM | POA: Insufficient documentation

## 2023-12-18 DIAGNOSIS — J449 Chronic obstructive pulmonary disease, unspecified: Secondary | ICD-10-CM

## 2023-12-18 DIAGNOSIS — M797 Fibromyalgia: Secondary | ICD-10-CM | POA: Insufficient documentation

## 2023-12-18 HISTORY — PX: VIDEO BRONCHOSCOPY WITH ENDOBRONCHIAL ULTRASOUND: SHX6177

## 2023-12-18 HISTORY — PX: BRONCHIAL BIOPSY: SHX5109

## 2023-12-18 HISTORY — PX: BRONCHIAL WASHINGS: SHX5105

## 2023-12-18 HISTORY — PX: BRONCHIAL BRUSHINGS: SHX5108

## 2023-12-18 HISTORY — PX: BRONCHIAL NEEDLE ASPIRATION BIOPSY: SHX5106

## 2023-12-18 LAB — BASIC METABOLIC PANEL
Anion gap: 10 (ref 5–15)
BUN: 22 mg/dL (ref 8–23)
CO2: 20 mmol/L — ABNORMAL LOW (ref 22–32)
Calcium: 8.4 mg/dL — ABNORMAL LOW (ref 8.9–10.3)
Chloride: 108 mmol/L (ref 98–111)
Creatinine, Ser: 1.23 mg/dL — ABNORMAL HIGH (ref 0.44–1.00)
GFR, Estimated: 50 mL/min — ABNORMAL LOW (ref >60)
Glucose, Bld: 87 mg/dL (ref 70–99)
Potassium: 4.3 mmol/L (ref 3.5–5.1)
Sodium: 138 mmol/L (ref 135–145)

## 2023-12-18 LAB — CBC
HCT: 42.4 % (ref 36.0–46.0)
Hemoglobin: 14.4 g/dL (ref 12.0–15.0)
MCH: 34.6 pg — ABNORMAL HIGH (ref 26.0–34.0)
MCHC: 34 g/dL (ref 30.0–36.0)
MCV: 101.9 fL — ABNORMAL HIGH (ref 80.0–100.0)
Platelets: 252 10*3/uL (ref 150–400)
RBC: 4.16 MIL/uL (ref 3.87–5.11)
RDW: 14.4 % (ref 11.5–15.5)
WBC: 7.9 10*3/uL (ref 4.0–10.5)
nRBC: 0 % (ref 0.0–0.2)

## 2023-12-18 SURGERY — BRONCHOSCOPY, WITH BIOPSY USING ELECTROMAGNETIC NAVIGATION
Anesthesia: General | Laterality: Bilateral

## 2023-12-18 MED ORDER — PROPOFOL 10 MG/ML IV BOLUS
INTRAVENOUS | Status: DC | PRN
  Administered 2023-12-18: 150 mg via INTRAVENOUS
  Administered 2023-12-18: 40 mg via INTRAVENOUS

## 2023-12-18 MED ORDER — LACTATED RINGERS IV SOLN: INTRAVENOUS | Status: DC

## 2023-12-18 MED ORDER — ROCURONIUM BROMIDE 10 MG/ML (PF) SYRINGE
PREFILLED_SYRINGE | INTRAVENOUS | Status: DC | PRN
  Administered 2023-12-18: 60 mg via INTRAVENOUS
  Administered 2023-12-18: 20 mg via INTRAVENOUS

## 2023-12-18 MED ORDER — CHLORHEXIDINE GLUCONATE 0.12 % MT SOLN
15.0000 mL | Freq: Once | OROMUCOSAL | Status: AC
  Administered 2023-12-18: 15 mL via OROMUCOSAL
  Filled 2023-12-18: qty 15

## 2023-12-18 MED ORDER — OXYCODONE HCL 5 MG/5ML PO SOLN: 5.0000 mg | Freq: Once | ORAL | Status: DC | PRN

## 2023-12-18 MED ORDER — DEXAMETHASONE SODIUM PHOSPHATE 10 MG/ML IJ SOLN
INTRAMUSCULAR | Status: DC | PRN
  Administered 2023-12-18: 4 mg via INTRAVENOUS

## 2023-12-18 MED ORDER — ONDANSETRON HCL 4 MG/2ML IJ SOLN
INTRAMUSCULAR | Status: DC | PRN
  Administered 2023-12-18: 4 mg via INTRAVENOUS

## 2023-12-18 MED ORDER — LIDOCAINE 2% (20 MG/ML) 5 ML SYRINGE: INTRAMUSCULAR | Status: DC | PRN

## 2023-12-18 MED ORDER — OXYCODONE HCL 5 MG PO TABS: 5.0000 mg | ORAL_TABLET | Freq: Once | ORAL | Status: DC | PRN

## 2023-12-18 MED ORDER — SUGAMMADEX SODIUM 200 MG/2ML IV SOLN
INTRAVENOUS | Status: DC | PRN
  Administered 2023-12-18: 200 mg via INTRAVENOUS

## 2023-12-18 MED ORDER — LIDOCAINE HCL (PF) 2 % IJ SOLN
INTRAMUSCULAR | Status: DC | PRN
  Administered 2023-12-18: 100 mg via INTRADERMAL

## 2023-12-18 MED ORDER — LABETALOL HCL 5 MG/ML IV SOLN
INTRAVENOUS | Status: DC | PRN
  Administered 2023-12-18 (×2): 5 mg via INTRAVENOUS

## 2023-12-18 MED ORDER — DROPERIDOL 2.5 MG/ML IJ SOLN: 0.6250 mg | Freq: Once | INTRAMUSCULAR | Status: DC | PRN

## 2023-12-18 MED ORDER — FENTANYL CITRATE (PF) 100 MCG/2ML IJ SOLN: 25.0000 ug | INTRAMUSCULAR | Status: DC | PRN

## 2023-12-18 NOTE — Op Note (Signed)
Video Bronchoscopy with Robotic Assisted Bronchoscopic Navigation   Date of Operation: 12/18/2023   Pre-op Diagnosis: lung nodule  Surgeon: Raechel Chute, MD  Anesthesia: General endotracheal anesthesia  Operation: Flexible video fiberoptic bronchoscopy with robotic assistance and biopsies.  Estimated Blood Loss: Minimal  Complications: None  Indications and History: Michelle Newton is a 63 y.o. female with history of of smoking presenting with a lung nodule for biopsy. The risks, benefits, complications, treatment options and expected outcomes were discussed with the patient.  The possibilities of pneumothorax, pneumonia, reaction to medication, pulmonary aspiration, perforation of a viscus, bleeding, failure to diagnose a condition and creating a complication requiring transfusion or operation were discussed with the patient who freely signed the consent.    Description of Procedure: The patient was seen in the Preoperative Area, was examined and was deemed appropriate to proceed.  The patient was taken to [facility] endoscopy room 3, identified as [name] and the procedure verified as Flexible Video Fiberoptic Bronchoscopy.  A Time Out was held and the above information confirmed.   Prior to the date of the procedure a high-resolution CT scan of the chest was performed. Utilizing ION software program a virtual tracheobronchial tree was generated to allow the creation of distinct navigation pathways to the patient's parenchymal abnormalities. After being taken to the operating room general anesthesia was initiated and the patient  was orally intubated. The video fiberoptic bronchoscope was introduced via the endotracheal tube and a general inspection was performed which showed normal right and left lung anatomy, aspiration of the bilateral mainstems was completed to remove any remaining secretions. Robotic catheter inserted into patient's endotracheal tube.   Target #1 LLL nodule: The  distinct navigation pathways prepared prior to this procedure were then utilized to navigate to patient's lesion identified on CT scan. The robotic catheter was secured into place and the vision probe was withdrawn.  Lesion location was approximated using fluoroscopy and 3D cone beam CT. Under fluoroscopic guidance transbronchial brushings, transbronchial needle biopsies, and transbronchial forceps biopsies were performed to be sent for cytology and culture.   We then turned our attention to the EBUS portion of the procedure. The EBUS bronchoscope was advanced through the ETT and the hilar and mediastinal lymph node stations were evaluated. Enlarged lymph nodes were encountered at stations 7 and 11L, both of which were biopsied with a 21G Olympus ViziShot 2 needle. A lymph node was noted at station 4L but was too small to biopsy.  Following this, the regular bronchoscope was advanced and the airway was cleared of secretions. A bronchioalveolar lavage was performed in the LLL, superior segment, and sent for cytology.  At the end of the procedure a general airway inspection was performed and there was no evidence of active bleeding. The bronchoscope was removed.  The patient tolerated the procedure well. There was no significant blood loss and there were no obvious complications. A post-procedural chest x-ray is pending.  Samples Target #1: LLL 1. Transbronchial brushings from LLL nodule 2. Transbronchial needle biopsies from LLL nodule 3. Transbronchial forceps biopsies from LLL nodule 4. Bronchoalveolar lavage from LLL superior segment  Samples Target #2: EBUS to stations 7 and 4L  Plans:  The patient will be discharged from the PACU to home when recovered from anesthesia and after chest x-ray is reviewed. We will review the cytology, pathology and microbiology results with the patient when they become available. Outpatient followup will be with me.  Raechel Chute, MD [facility] 12/18/2023 10:19 AM

## 2023-12-18 NOTE — Interval H&P Note (Signed)
S: Feels well, continues to complain of cough that is sometimes productive. No chest pain.  O: Vitals:   12/18/23 0659  BP: (!) 164/84  Pulse: 84  Resp: 18  Temp: 97.7 F (36.5 C)  SpO2: 97%    Recent Results (from the past 2160 hours)  ECHOCARDIOGRAM COMPLETE     Status: None   Collection Time: 10/08/23 10:56 AM  Result Value Ref Range   AR max vel 2.61 cm2   AV Peak grad 3.4 mmHg   Ao pk vel 0.93 m/s   S' Lateral 2.40 cm   Area-P 1/2 3.85 cm2   AV Area VTI 2.53 cm2   AV Mean grad 2.0 mmHg   Single Plane A4C EF 53.5 %   Single Plane A2C EF 53.9 %   Calc EF 53.6 %   AV Area mean vel 2.52 cm2   Est EF 60 - 65%   NM Myocar Multi W/Spect W/Wall Motion / EF     Status: None   Collection Time: 10/24/23 11:54 AM  Result Value Ref Range   Rest HR 63.0 bpm   Rest BP 119/92 mmHg   Peak HR 100 bpm   Peak BP 132/103 mmHg   MPHR 160 bpm   Percent HR 62.0 %   Rest Nuclear Isotope Dose 10.3 mCi   Stress Nuclear Isotope Dose 32.3 mCi   SSS 2.0    SRS 0.0    SDS 1.0    TID 0.74    LV sys vol 5.0 mL   LV dias vol 23.0 46 - 106 mL   Nuc Stress EF 78 %   ST Depression (mm) 0 mm  Pulmonary Function Test [facility] Only     Status: None   Collection Time: 11/28/23 11:02 AM  Result Value Ref Range   FVC-%Pred-Pre 75 %   FVC-Post 2.09 L   FVC-%Pred-Post 75 %   FVC-%Change-Post 0 %   FEV1-Pre 1.78 L   FEV1-%Pred-Pre 84 %   FEV1-Post 1.66 L   FEV1-%Pred-Post 78 %   FEV1-%Change-Post -7 %   FEV6-Pre 2.07 L   FEV6-%Pred-Pre 78 %   FEV6-Post 2.09 L   FEV6-%Pred-Post 78 %   FEV6-%Change-Post 0 %   Pre FEV1/FVC ratio 86 %   FEV1FVC-%Pred-Pre 110 %   Post FEV1/FVC ratio 79 %   FEV1FVC-%Change-Post -7 %   Pre FEV6/FVC Ratio 100 %   FEV6FVC-%Pred-Pre 103 %   Post FEV6/FVC ratio 100 %   FEV6FVC-%Pred-Post 103 %   FEF 25-75 Pre 2.37 L/sec   FEF2575-%Pred-Pre 115 %   FEF 25-75 Post 1.63 L/sec   FEF2575-%Pred-Post 79 %   FEF2575-%Change-Post -31 %   RV 1.74 L   RV % pred 97 %    TLC 4.14 L   TLC % pred 94 %   DLCO unc 11.67 ml/min/mmHg   DLCO unc % pred 67 %   DL/VA 4.09 ml/min/mmHg/L   DL/VA % pred 76 %   FVC-Pre 2.07 L  Basic metabolic panel per protocol     Status: Abnormal   Collection Time: 12/18/23  7:16 AM  Result Value Ref Range   Sodium 138 135 - 145 mmol/L   Potassium 4.3 3.5 - 5.1 mmol/L   Chloride 108 98 - 111 mmol/L   CO2 20 (L) 22 - 32 mmol/L   Glucose, Bld 87 70 - 99 mg/dL    Comment: Glucose reference range applies only to samples taken after fasting for at least 8 hours.  BUN 22 8 - 23 mg/dL   Creatinine, Ser 1.61 (H) 0.44 - 1.00 mg/dL   Calcium 8.4 (L) 8.9 - 10.3 mg/dL   GFR, Estimated 50 (L) >60 mL/min    Comment: (NOTE) Calculated using the CKD-EPI Creatinine Equation (2021)    Anion gap 10 5 - 15    Comment: Performed at [facility], [address] [place], [place] [address]  CBC per protocol     Status: Abnormal   Collection Time: 12/18/23  7:16 AM  Result Value Ref Range   WBC 7.9 4.0 - 10.5 K/uL   RBC 4.16 3.87 - 5.11 MIL/uL   Hemoglobin 14.4 12.0 - 15.0 g/dL   HCT 54.0 98.1 - 19.1 %   MCV 101.9 (H) 80.0 - 100.0 fL   MCH 34.6 (H) 26.0 - 34.0 pg   MCHC 34.0 30.0 - 36.0 g/dL   RDW 47.8 29.5 - 62.1 %   Platelets 252 150 - 400 K/uL   nRBC 0.0 0.0 - 0.2 %    Comment: Performed at [facility], [address] [place], [place] [address]   General: Well-appearing and in no distress. Well-nourished Eyes: Anicteric, no conjunctival pallor HEENT: Mucous membranes moist, no evidence of postnasal drip Lymphadenopathy: No cervical or supraclavicular adenopathy Respiratory: Trachea is midline, no respiratory distress, good bilateral air entry, no wheezes, rales, or rhonchi Cardiovascular: Heart with regular rate and rhythm, normal S1 and S2, no murmurs, rubs, or gallops Gastrointestinal: Normoactive bowel sounds, soft and nontender Musculoskeletal: No clubbing or digital cyanosis, normal range of motion Skin: No rashes  on limited exam Neuro: Alert and oriented, no gross focal deficits  A/P: 63 year old female noted to have a LLL nodule presenting for robotic assisted navigational bronchoscopy for tissue acquisition. Risks and benefits explained, and all questions answered. Patient is appropriate for the procedure.  Raechel Chute, MD [facility] 12/18/2023 8:57 AM

## 2023-12-18 NOTE — Anesthesia Preprocedure Evaluation (Signed)
Anesthesia Evaluation  Patient identified by MRN, date of birth, ID band Patient awake    Reviewed: Allergy & Precautions, H&P , NPO status , Patient's Chart, lab work & pertinent test results  Airway Mallampati: II  TM Distance: >3 FB Neck ROM: Full    Dental no notable dental hx.    Pulmonary asthma , COPD, Current Smoker and Patient abstained from smoking.  lung nodule   Pulmonary exam normal breath sounds clear to auscultation       Cardiovascular hypertension, + CAD, + Past MI and + Peripheral Vascular Disease  Normal cardiovascular exam Rhythm:Regular Rate:Normal     Neuro/Psych  Headaches, Seizures -,  PSYCHIATRIC DISORDERS Anxiety Depression       GI/Hepatic Neg liver ROS, PUD,,,  Endo/Other  negative endocrine ROS    Renal/GU negative Renal ROS  negative genitourinary   Musculoskeletal  (+) Arthritis ,  Fibromyalgia -  Abdominal   Peds negative pediatric ROS (+)  Hematology negative hematology ROS (+)   Anesthesia Other Findings   Reproductive/Obstetrics negative OB ROS                              Anesthesia Physical Anesthesia Plan  ASA: 3  Anesthesia Plan: General   Post-op Pain Management:    Induction: Intravenous  PONV Risk Score and Plan: 2 and Ondansetron, Dexamethasone and Treatment may vary due to age or medical condition  Airway Management Planned: Oral ETT  Additional Equipment:   Intra-op Plan:   Post-operative Plan: Extubation in OR  Informed Consent: I have reviewed the patients History and Physical, chart, labs and discussed the procedure including the risks, benefits and alternatives for the proposed anesthesia with the patient or authorized representative who has indicated his/her understanding and acceptance.     Dental advisory given  Plan Discussed with: CRNA  Anesthesia Plan Comments:          Anesthesia Quick Evaluation

## 2023-12-18 NOTE — Transfer of Care (Signed)
Immediate Anesthesia Transfer of Care Note  Patient: Michelle Newton  Procedure(s) Performed: ROBOTIC ASSISTED NAVIGATIONAL BRONCHOSCOPY (Bilateral) BRONCHIAL NEEDLE ASPIRATION BIOPSIES BRONCHIAL BRUSHINGS BRONCHIAL BIOPSIES VIDEO BRONCHOSCOPY WITH ENDOBRONCHIAL ULTRASOUND BRONCHIAL WASHINGS  Patient Location: PACU  Anesthesia Type:General  Level of Consciousness: awake, alert , and oriented  Airway & Oxygen Therapy: Patient Spontanous Breathing  Post-op Assessment: Report given to RN and Post -op Vital signs reviewed and stable  Post vital signs: Reviewed and stable  Last Vitals:  Vitals Value Taken Time  BP 162/78 12/18/23 1026  Temp    Pulse 98 12/18/23 1028  Resp 20 12/18/23 1028  SpO2 92 % 12/18/23 1028  Vitals shown include unfiled device data.  Last Pain:  Vitals:   12/18/23 0715  TempSrc:   PainSc: 7       Patients Stated Pain Goal: 4 (12/18/23 0715)  Complications: No notable events documented.

## 2023-12-18 NOTE — Anesthesia Procedure Notes (Signed)
Procedure Name: Intubation Date/Time: 12/18/2023 9:08 AM  Performed by: Sandie Ano, CRNAPre-anesthesia Checklist: Patient identified, Emergency Drugs available, Suction available and Patient being monitored Patient Re-evaluated:Patient Re-evaluated prior to induction Oxygen Delivery Method: Circle System Utilized Preoxygenation: Pre-oxygenation with 100% oxygen Induction Type: IV induction Ventilation: Mask ventilation without difficulty Laryngoscope Size: Mac and 3 Grade View: Grade I Tube type: Oral Tube size: 8.0 mm Number of attempts: 1 Airway Equipment and Method: Stylet and Oral airway Placement Confirmation: ETT inserted through vocal cords under direct vision, positive ETCO2 and breath sounds checked- equal and bilateral Secured at: 22 cm Tube secured with: Tape Dental Injury: Teeth and Oropharynx as per pre-operative assessment

## 2023-12-19 LAB — CYTOLOGY - NON PAP

## 2023-12-19 NOTE — Anesthesia Postprocedure Evaluation (Signed)
Anesthesia Post Note  Patient: Michelle Newton  Procedure(s) Performed: ROBOTIC ASSISTED NAVIGATIONAL BRONCHOSCOPY (Bilateral) BRONCHIAL NEEDLE ASPIRATION BIOPSIES BRONCHIAL BRUSHINGS BRONCHIAL BIOPSIES VIDEO BRONCHOSCOPY WITH ENDOBRONCHIAL ULTRASOUND BRONCHIAL WASHINGS     Patient location during evaluation: PACU Anesthesia Type: General Level of consciousness: awake and alert Pain management: pain level controlled Vital Signs Assessment: post-procedure vital signs reviewed and stable Respiratory status: spontaneous breathing, nonlabored ventilation, respiratory function stable and patient connected to nasal cannula oxygen Cardiovascular status: blood pressure returned to baseline and stable Postop Assessment: no apparent nausea or vomiting Anesthetic complications: no   No notable events documented.  Last Vitals:  Vitals:   12/18/23 1045 12/18/23 1100  BP: (!) 147/86 134/78  Pulse: 88 84  Resp: 12 13  Temp:  36.5 C  SpO2: 93% 93%    Last Pain:  Vitals:   12/18/23 1100  TempSrc:   PainSc: 0-No pain                 [place]

## 2023-12-20 LAB — ACID FAST SMEAR (AFB, MYCOBACTERIA): Acid Fast Smear: NEGATIVE

## 2023-12-21 ENCOUNTER — Encounter (HOSPITAL_COMMUNITY): Payer: Self-pay | Admitting: Student in an Organized Health Care Education/Training Program

## 2023-12-23 LAB — AEROBIC/ANAEROBIC CULTURE W GRAM STAIN (SURGICAL/DEEP WOUND)
Culture: NO GROWTH
Gram Stain: NONE SEEN

## 2023-12-25 LAB — CYTOLOGY - NON PAP

## 2023-12-30 ENCOUNTER — Ambulatory Visit: Payer: 59 | Admitting: Student in an Organized Health Care Education/Training Program

## 2024-01-15 ENCOUNTER — Ambulatory Visit: Payer: 59 | Admitting: Student in an Organized Health Care Education/Training Program

## 2024-01-19 LAB — FUNGAL ORGANISM REFLEX

## 2024-01-19 LAB — FUNGUS CULTURE WITH STAIN

## 2024-01-19 LAB — FUNGUS CULTURE RESULT

## 2024-01-21 ENCOUNTER — Ambulatory Visit: Payer: 59 | Admitting: Student in an Organized Health Care Education/Training Program

## 2024-01-28 ENCOUNTER — Encounter: Payer: Self-pay | Admitting: Student in an Organized Health Care Education/Training Program

## 2024-01-28 ENCOUNTER — Ambulatory Visit (INDEPENDENT_AMBULATORY_CARE_PROVIDER_SITE_OTHER): Payer: 59 | Admitting: Student in an Organized Health Care Education/Training Program

## 2024-01-28 VITALS — BP 146/90 | HR 87 | Temp 97.6°F | Ht 59.5 in

## 2024-01-28 DIAGNOSIS — J4520 Mild intermittent asthma, uncomplicated: Secondary | ICD-10-CM

## 2024-01-28 DIAGNOSIS — R911 Solitary pulmonary nodule: Secondary | ICD-10-CM | POA: Diagnosis not present

## 2024-01-28 NOTE — Progress Notes (Signed)
 Assessment & Plan:   1. Lung nodule  Patient presented for the evaluation of a LLL pulmonary nodule that was biopsied with robotic assisted navigational bronchoscopy and cytology returning negative for malignancy. EBUS to stations 7 and 11L was also negative. We will continue to monitor this nodule radiographically, and I will obtain a repeat chest CT in 3 months for radiographic surveillance of the nodule.  - CT Super D Chest Wo Contrast; Future  2. Mild intermittent asthma without complication  She is reporting an occasional wheeze and cough that is non-productive. She was told she had asthma in [place], and is currently on Advair and Combivent. PFT obtained in December and does not suggest COPD. Will continue with Advair and Combivent PRN.   Return in about 3 months (around 04/26/2024).  I spent 30 minutes caring for this patient today, including preparing to see the patient, obtaining a medical history , reviewing a separately obtained history, performing a medically appropriate examination and/or evaluation, counseling and educating the patient/family/caregiver, ordering medications, tests, or procedures, and documenting clinical information in the electronic health record  Raechel Chute, MD [facility] 01/28/2024 6:33 PM    End of visit medications:  No orders of the defined types were placed in this encounter.    Current Outpatient Medications:    ADVAIR DISKUS 500-50 MCG/DOSE AEPB, , Disp: , Rfl:    albuterol (PROVENTIL) (2.5 MG/3ML) 0.083% nebulizer solution, as needed. , Disp: , Rfl:    albuterol-ipratropium (COMBIVENT) 18-103 MCG/ACT inhaler, Inhale 2 puffs into the lungs every 6 (six) hours as needed for wheezing or shortness of breath., Disp: , Rfl:    aspirin EC 81 MG tablet, Take 81 mg by mouth daily., Disp: , Rfl:    azelastine (OPTIVAR) 0.05 % ophthalmic solution, Place 1 drop into both eyes daily., Disp: , Rfl:    benzonatate (TESSALON) 200 MG  capsule, Take 200 mg by mouth 3 (three) times daily as needed for cough., Disp: , Rfl:    bisoprolol-hydrochlorothiazide (ZIAC) 5-6.25 MG tablet, Take 0.5 tablets by mouth daily., Disp: , Rfl:    butalbital-acetaminophen-caffeine (FIORICET, ESGIC) 50-325-40 MG tablet, Take 1 tablet by mouth 2 (two) times daily., Disp: , Rfl:    Calcium Carbonate-Vit D-Min (CALCIUM 1200 PO), Take by mouth daily., Disp: , Rfl:    Cholecalciferol (VITAMIN D) 2000 UNITS tablet, Take 1 tablet (2,000 Units total) by mouth daily. (Patient taking differently: Take 4,000 Units by mouth daily.), Disp: 30 tablet, Rfl: PRN   COMBIVENT RESPIMAT 20-100 MCG/ACT AERS respimat, Inhale 1 puff into the lungs 2 (two) times daily., Disp: , Rfl: 1   cyanocobalamin (,VITAMIN B-12,) 1000 MCG/ML injection, every 30 (thirty) days., Disp: , Rfl:    dicyclomine (BENTYL) 10 MG capsule, take 1 capsule by mouth four times a day if needed for ABDOMINAL SPASM, Disp: , Rfl: 0   estradiol (CLIMARA - DOSED IN MG/24 HR) 0.1 mg/24hr patch, Place 0.1 mg onto the skin once a week. On Wednesday, Disp: , Rfl:    Fluticasone-Salmeterol (ADVAIR) 100-50 MCG/DOSE AEPB, Inhale 1 puff into the lungs 2 (two) times daily., Disp: , Rfl:    hydrochlorothiazide (HYDRODIURIL) 12.5 MG tablet, , Disp: , Rfl:    HYDROcodone-acetaminophen (NORCO) 10-325 MG tablet, Take 1 tablet by mouth 3 (three) times daily as needed., Disp: , Rfl:    MAGNESIUM-POTASSIUM PO, Take 1 tablet by mouth daily in the afternoon., Disp: , Rfl:    meclizine (ANTIVERT) 25 MG tablet, Take 25 mg  by mouth 3 (three) times daily as needed., Disp: , Rfl:    nitroGLYCERIN (NITROSTAT) 0.4 MG SL tablet, Place 0.4 mg under the tongue every 5 (five) minutes as needed for chest pain., Disp: , Rfl:    PARoxetine (PAXIL) 30 MG tablet, Take 30 mg by mouth daily., Disp: , Rfl:    vitamin E 1000 UNIT capsule, Take 1,000 Units by mouth daily., Disp: , Rfl:    cyclobenzaprine (FLEXERIL) 10 MG tablet, Take 1 tablet (10  mg total) by mouth at bedtime. (Patient not taking: Reported on 12/17/2023), Disp: 30 tablet, Rfl: 0   rosuvastatin (CRESTOR) 40 MG tablet, Take 1 tablet (40 mg total) by mouth daily. (Patient taking differently: Take 40 mg by mouth daily. Patient only taking 20mg ), Disp: 90 tablet, Rfl: 3   traMADol (ULTRAM) 50 MG tablet, Take 100 mg by mouth 2 (two) times daily. (Patient not taking: Reported on 11/21/2023), Disp: , Rfl:    Subjective:   PATIENT ID: Michelle Newton GENDER: female DOB: 01-04-1961, MRN: [id number]  Chief Complaint  Patient presents with   Follow-up    HPI  Patient is a pleasant 63 year old female with a past medical history of smoking who presents to clinic for follow up of her pulmonary nodule.  She's felt well following her biopsy, without increase in respiratory symptoms. She continues to have exertional dyspnea and a cough. She unfortunately fell and broke her foot. She is compliant with her inhalers. She is trying to quit smoking, but continues to smoke a few cigarettes a day. She is presenting today for follow up.  Patient was initially referred to [place] after being found to have a pulmonary nodule.  Given history of smoking as well as previous history of PAD (left iliac stent previously placed), she was ordered a stress test which was noted for a left lower lobe nodule.  This was confirmed with a CT scan of the chest performed on 10/31/2023. She underwent robotic assisted navigational bronchoscopy to the LLL pulmonary nodule on 12/18/2023 with cytology returning negative for malignancy. Similarly, EBUS to station 7 and 11L was negative for malignancy.    Patient denies any occupational exposures, reports longstanding history of smoking.  She started smoking at age of 43. She likely has around 50 pack years of smoking history.   Past medical history is significant for PAD, asthma, and subclavian artery stenosis (left).  Ancillary information including prior medications, full  medical/surgical/family/social histories, and PFTs (when available) are listed below and have been reviewed.   Review of Systems  Constitutional:  Negative for chills, fever and weight loss.  Respiratory:  Positive for cough and shortness of breath. Negative for hemoptysis, sputum production and wheezing.   Cardiovascular:  Negative for chest pain.     Objective:   Vitals:   01/28/24 1158  BP: (!) 146/90  Pulse: 87  Temp: 97.6 F (36.4 C)  TempSrc: Temporal  SpO2: 96%  Newton: 4' 11.5" (1.511 m)   96% on RA BMI Readings from Last 3 Encounters:  01/28/24 25.82 kg/m  12/18/23 25.82 kg/m  11/21/23 25.82 kg/m   Wt Readings from Last 3 Encounters:  12/18/23 130 lb (59 kg)  11/21/23 130 lb (59 kg)  10/24/23 125 lb (56.7 kg)    Physical Exam Constitutional:      Appearance: Normal appearance.  Cardiovascular:     Rate and Rhythm: Normal rate and regular rhythm.     Pulses: Normal pulses.     Heart sounds: Normal heart  sounds.  Pulmonary:     Effort: Pulmonary effort is normal.     Breath sounds: No wheezing, rhonchi or rales.  Neurological:     General: No focal deficit present.     Mental Status: She is alert and oriented to person, place, and time. Mental status is at baseline.       Ancillary Information    Past Medical History:  Diagnosis Date   Anxiety    Asthma    CAD (coronary artery disease)    Chronic fatigue    Chronic thumb pain, bilateral 09/12/2015   COPD (chronic obstructive pulmonary disease) (HCC)    Coronary artery disease    Depression    DJD (degenerative joint disease)    DJD (degenerative joint disease)    Essential hypertension 09/12/2015   Family history of chronic pain 09/26/2015   Fibromyalgia    Fibromyalgia    Fracture five ribs-closed 09/12/2015   H/O acute myocardial infarction 12/14/2014   H/O non-insulin dependent diabetes mellitus 09/12/2015   History of cardiac arrhythmia 09/12/2015   History of elbow surgery  09/26/2015   Left ulnar nerve transposition   History of knee surgery 09/26/2015   History of migraine 09/12/2015   History of seizures 09/12/2015   Hypercholesteremia    MI (myocardial infarction) (HCC)    Migraine    Migraine    Osteoarthritis    PAD (peripheral artery disease) (HCC)    Right shoulder: Acute Pain due to trauma 04/18/2016   Right shoulder: Chronic Pain 09/12/2015   Seizures (HCC)    Ulcerative (chronic) enterocolitis (HCC)    Vertigo      Family History  Problem Relation Age of Onset   Heart disease Father    Heart disease Brother    Heart disease Maternal Aunt    Breast cancer Maternal Aunt    Heart disease Paternal Aunt    Breast cancer Cousin      Past Surgical History:  Procedure Laterality Date   ABDOMINAL AORTA STENT  2021   ABDOMINAL HYSTERECTOMY     BACK SURGERY     BRONCHIAL BIOPSY  12/18/2023   Procedure: BRONCHIAL BIOPSIES;  Surgeon: Raechel Chute, MD;  Location: [facility];  Service: Pulmonary;;   BRONCHIAL BRUSHINGS  12/18/2023   Procedure: BRONCHIAL BRUSHINGS;  Surgeon: Raechel Chute, MD;  Location: [facility];  Service: Pulmonary;;   BRONCHIAL NEEDLE ASPIRATION BIOPSY  12/18/2023   Procedure: BRONCHIAL NEEDLE ASPIRATION BIOPSIES;  Surgeon: Raechel Chute, MD;  Location: [facility];  Service: Pulmonary;;   BRONCHIAL WASHINGS  12/18/2023   Procedure: BRONCHIAL WASHINGS;  Surgeon: Raechel Chute, MD;  Location: [facility];  Service: Pulmonary;;   KNEE ARTHROPLASTY Left    left elbow surgery     LUMBAR DISC SURGERY     ROTATOR CUFF REPAIR Right    TRIGGER FINGER RELEASE Left    ureter mesh     VIDEO BRONCHOSCOPY WITH ENDOBRONCHIAL ULTRASOUND  12/18/2023   Procedure: VIDEO BRONCHOSCOPY WITH ENDOBRONCHIAL ULTRASOUND;  Surgeon: Raechel Chute, MD;  Location: [facility];  Service: Pulmonary;;    Social History   Socioeconomic History   Marital status: Single    Spouse name: Not on file   Number of children: Not on file   Years  of education: Not on file   Highest education level: Not on file  Occupational History   Not on file  Tobacco Use   Smoking status: Every Day    Current packs/day: 0.25    Average packs/day: 0.3  packs/day for 48.2 years (12.0 ttl pk-yrs)    Types: Cigarettes    Start date: 20   Smokeless tobacco: Never  Vaping Use   Vaping status: Never Used  Substance and Sexual Activity   Alcohol use: No   Drug use: No   Sexual activity: Not Currently    Birth control/protection: Surgical  Other Topics Concern   Not on file  Social History Narrative   Not on file   Social Drivers of Health   Financial Resource Strain: Not on file  Food Insecurity: Not on file  Transportation Needs: Not on file  Physical Activity: Not on file  Stress: Not on file  Social Connections: Not on file  Intimate Partner Violence: Not on file     Allergies  Allergen Reactions   Cymbalta [Duloxetine Hcl] Other (See Comments)    dizzy   Duloxetine     Other reaction(s): Dizziness   Flagyl [Metronidazole] Other (See Comments)    Other reaction(s): Other (See Comments) dizzy   Gabapentin Other (See Comments)   Lyrica [Pregabalin] Other (See Comments)    Other reaction(s): Dizziness Caused eye damage "It did damage to my eyes"   Vicodin [Hydrocodone-Acetaminophen] Nausea And Vomiting    Other reaction(s): Vomiting   Nsaids Other (See Comments) and Rash    bleeding   Silicone Rash   Tape Rash     CBC    Component Value Date/Time   WBC 7.9 12/18/2023 0716   RBC 4.16 12/18/2023 0716   HGB 14.4 12/18/2023 0716   HGB 15.1 04/18/2023 1114   HCT 42.4 12/18/2023 0716   HCT 44.1 04/18/2023 1114   PLT 252 12/18/2023 0716   PLT 356 10/26/2013 1938   MCV 101.9 (H) 12/18/2023 0716   MCV 100 (H) 04/18/2023 1114   MCV 102 (H) 10/26/2013 1938   MCH 34.6 (H) 12/18/2023 0716   MCHC 34.0 12/18/2023 0716   RDW 14.4 12/18/2023 0716   RDW 12.6 04/18/2023 1114   RDW 14.1 10/26/2013 1938   LYMPHSABS 2.7  04/18/2023 1114   LYMPHSABS 4.8 (H) 10/26/2013 1938   MONOABS 0.6 09/11/2015 0254   MONOABS 0.7 10/26/2013 1938   EOSABS 0.2 04/18/2023 1114   EOSABS 0.0 10/26/2013 1938   BASOSABS 0.1 04/18/2023 1114   BASOSABS 0.1 10/26/2013 1938    Pulmonary Functions Testing Results:    Latest Ref Rng & Units 11/28/2023   11:02 AM  PFT Results  FVC-Pre L 2.07   FVC-Predicted Pre % 75   FVC-Post L 2.09   FVC-Predicted Post % 75   Pre FEV1/FVC % % 86   Post FEV1/FCV % % 79   FEV1-Pre L 1.78   FEV1-Predicted Pre % 84   FEV1-Post L 1.66   DLCO uncorrected ml/min/mmHg 11.67   DLCO UNC% % 67   DLVA Predicted % 76   TLC L 4.14   TLC % Predicted % 94   RV % Predicted % 97     Outpatient Medications Prior to Visit  Medication Sig Dispense Refill   ADVAIR DISKUS 500-50 MCG/DOSE AEPB      albuterol (PROVENTIL) (2.5 MG/3ML) 0.083% nebulizer solution as needed.      albuterol-ipratropium (COMBIVENT) 18-103 MCG/ACT inhaler Inhale 2 puffs into the lungs every 6 (six) hours as needed for wheezing or shortness of breath.     aspirin EC 81 MG tablet Take 81 mg by mouth daily.     azelastine (OPTIVAR) 0.05 % ophthalmic solution Place 1 drop into both eyes  daily.     benzonatate (TESSALON) 200 MG capsule Take 200 mg by mouth 3 (three) times daily as needed for cough.     bisoprolol-hydrochlorothiazide (ZIAC) 5-6.25 MG tablet Take 0.5 tablets by mouth daily.     butalbital-acetaminophen-caffeine (FIORICET, ESGIC) 50-325-40 MG tablet Take 1 tablet by mouth 2 (two) times daily.     Calcium Carbonate-Vit D-Min (CALCIUM 1200 PO) Take by mouth daily.     Cholecalciferol (VITAMIN D) 2000 UNITS tablet Take 1 tablet (2,000 Units total) by mouth daily. (Patient taking differently: Take 4,000 Units by mouth daily.) 30 tablet PRN   COMBIVENT RESPIMAT 20-100 MCG/ACT AERS respimat Inhale 1 puff into the lungs 2 (two) times daily.  1   cyanocobalamin (,VITAMIN B-12,) 1000 MCG/ML injection every 30 (thirty) days.      dicyclomine (BENTYL) 10 MG capsule take 1 capsule by mouth four times a day if needed for ABDOMINAL SPASM  0   estradiol (CLIMARA - DOSED IN MG/24 HR) 0.1 mg/24hr patch Place 0.1 mg onto the skin once a week. On Wednesday     Fluticasone-Salmeterol (ADVAIR) 100-50 MCG/DOSE AEPB Inhale 1 puff into the lungs 2 (two) times daily.     hydrochlorothiazide (HYDRODIURIL) 12.5 MG tablet      HYDROcodone-acetaminophen (NORCO) 10-325 MG tablet Take 1 tablet by mouth 3 (three) times daily as needed.     MAGNESIUM-POTASSIUM PO Take 1 tablet by mouth daily in the afternoon.     meclizine (ANTIVERT) 25 MG tablet Take 25 mg by mouth 3 (three) times daily as needed.     nitroGLYCERIN (NITROSTAT) 0.4 MG SL tablet Place 0.4 mg under the tongue every 5 (five) minutes as needed for chest pain.     PARoxetine (PAXIL) 30 MG tablet Take 30 mg by mouth daily.     vitamin E 1000 UNIT capsule Take 1,000 Units by mouth daily.     cyclobenzaprine (FLEXERIL) 10 MG tablet Take 1 tablet (10 mg total) by mouth at bedtime. (Patient not taking: Reported on 12/17/2023) 30 tablet 0   rosuvastatin (CRESTOR) 40 MG tablet Take 1 tablet (40 mg total) by mouth daily. (Patient taking differently: Take 40 mg by mouth daily. Patient only taking 20mg ) 90 tablet 3   traMADol (ULTRAM) 50 MG tablet Take 100 mg by mouth 2 (two) times daily. (Patient not taking: Reported on 11/21/2023)     Magnesium 400 MG TABS Take 1 tablet by mouth 2 (two) times daily.  (Patient not taking: Reported on 01/28/2024)     Potassium 95 MG TABS Take 1 tablet by mouth daily. (Patient not taking: Reported on 01/28/2024)     No facility-administered medications prior to visit.

## 2024-01-30 ENCOUNTER — Ambulatory Visit: Payer: 59 | Admitting: Cardiology

## 2024-02-03 LAB — ACID FAST CULTURE WITH REFLEXED SENSITIVITIES (MYCOBACTERIA): Acid Fast Culture: NEGATIVE

## 2024-03-05 ENCOUNTER — Ambulatory Visit: Payer: 59 | Attending: Cardiology | Admitting: Cardiology

## 2024-03-05 ENCOUNTER — Encounter: Payer: Self-pay | Admitting: Cardiology

## 2024-03-05 ENCOUNTER — Ambulatory Visit: Payer: 59 | Admitting: Pulmonary Disease

## 2024-03-05 VITALS — BP 137/86 | HR 84 | Ht 59.5 in | Wt 130.4 lb

## 2024-03-05 DIAGNOSIS — R911 Solitary pulmonary nodule: Secondary | ICD-10-CM | POA: Diagnosis not present

## 2024-03-05 DIAGNOSIS — I739 Peripheral vascular disease, unspecified: Secondary | ICD-10-CM | POA: Diagnosis not present

## 2024-03-05 DIAGNOSIS — I771 Stricture of artery: Secondary | ICD-10-CM

## 2024-03-05 DIAGNOSIS — E782 Mixed hyperlipidemia: Secondary | ICD-10-CM

## 2024-03-05 DIAGNOSIS — I10 Essential (primary) hypertension: Secondary | ICD-10-CM

## 2024-03-05 NOTE — Patient Instructions (Signed)
 Medication Instructions:  No changes *If you need a refill on your cardiac medications before your next appointment, please call your pharmacy*  Lab Work: Your provider would like for you to have the following labs today: Lipid and Liver  If you have labs (blood work) drawn today and your tests are completely normal, you will receive your results only by: MyChart Message (if you have MyChart) OR A paper copy in the mail If you have any lab test that is abnormal or we need to change your treatment, we will call you to review the results.  Testing/Procedures: Your physician has requested that you have a carotid duplex. This test is an ultrasound of the carotid arteries in your neck. It looks at blood flow through these arteries that supply the brain with blood.   Allow one hour for this exam.  There are no restrictions or special instructions.  This will take place at [address] [facility]) [address], [place] [address]  Please note: We ask at that you not bring children with you during ultrasound (echo/ vascular) testing. Due to room size and safety concerns, children are not allowed in the ultrasound rooms during exams. Our front office staff cannot provide observation of children in our lobby area while testing is being conducted. An adult accompanying a patient to their appointment will only be allowed in the ultrasound room at the discretion of the ultrasound technician under special circumstances. We apologize for any inconvenience.   Follow-Up: At [facility], you and your health needs are our priority.  As part of our continuing mission to provide you with exceptional heart care, our providers are all part of one team.  This team includes your primary Cardiologist (physician) and Advanced Practice Providers or APPs (Physician Assistants and Nurse Practitioners) who all work together to provide you with the care you need, when you need it.  Your next  appointment:   3 month(s)  Provider:   You may see Debbe Odea, MD or one of the following Advanced Practice Providers on your designated Care Team:   Charlsie Quest, NP  We recommend signing up for the patient portal called "MyChart".  Sign up information is provided on this After Visit Summary.  MyChart is used to connect with patients for Virtual Visits (Telemedicine).  Patients are able to view lab/test results, encounter notes, upcoming appointments, etc.  Non-urgent messages can be sent to your provider as well.   To learn more about what you can do with MyChart, go to [email or web address].

## 2024-03-05 NOTE — Progress Notes (Signed)
 Cardiology Office Note:  .   Date:  03/05/2024  ID:  [name], DOB Nov 12, 1961, MRN [id number] PCP: Dortha Kern, MD  [facility] Providers Cardiologist:  Debbe Odea, MD    History of Present Illness: BREANAH FADDIS is a 63 y.o. female with a past medical history of hypertension, hyperlipidemia, tobacco use, COPD, PAD status post left iliac stent (2021), who presents for follow-up.  Previously was seen in October 2024 for elevated blood pressure and chest pain.  Blood pressure was different and extremities.  Arterial ultrasound (extremities, echocardiogram, Lexiscan Myoview ordered.  Echocardiogram revealed LVEF of 60 to 65%, no RWMA, G1 DD.  MPI showed no ischemia 8 mm lung nodule. Upper extremity ultrasound revealed stenosis of the left subclavian artery.  She was last seen in clinic 10/24/2023 stating that she was having occasional cramping that was brief but denied any chest pain and continued with chronic shortness of breath likely from her COPD.  Her rosuvastatin was increased to 40 mg daily and referral was made to vascular surgery.  She returns to clinic today accompanied by her husband.  She states that the only complaint that she has been having is a left arm pain that is worse when she is lying down and extends down her arm into her hand she is worried that her subclavian stenosis has worsened.  She also sustained a mechanical fall walking through her house talking on her telephone and fell in the floor.  She had a sprained ankle and had to be placed in a walking boot.  Previously she was advised to increase her rosuvastatin to 40 mg daily stated that she was only taking 20 mg of the rosuvastatin due to diarrhea.  She stated that she went back on 20 mg and has tolerated the medication without issues.  She states that she has been compliant with the rest of her medications.  Denies any hospitalizations or recent emergency department visits.  ROS: 10 point review of  systems has been reviewed and considered negative except what is listed in the HPI  Studies Reviewed: Marland Kitchen        Myoview lexiscan 10/24/23 Narrative & Impression      Normal pharmacologic myocardial perfusion stress test without evidence of significant ischemia or scar.   Left ventricular systolic function is normal (LVEF > 65%).   There is no significant coronary artery calcification.  Aortic atherosclerosis is present.   An 8 mm nodule is identified in the left lower lobe.  Further evaluation with dedicated chest CT is recommended.   This is a low-risk study.     Echo 10/2023    1. Left ventricular ejection fraction, by estimation, is 60 to 65%. The  left ventricle has normal function. The left ventricle has no regional  wall motion abnormalities. Left ventricular diastolic parameters are  consistent with Grade I diastolic  dysfunction (impaired relaxation). The average left ventricular global  longitudinal strain is -16.6 %.   2. Right ventricular systolic function is normal. The right ventricular  size is normal. There is normal pulmonary artery systolic pressure. The  estimated right ventricular systolic pressure is 30.4 mmHg.   3. The mitral valve is normal in structure. No evidence of mitral valve  regurgitation. No evidence of mitral stenosis.   4. The aortic valve is tricuspid. Aortic valve regurgitation is not  visualized. Aortic valve sclerosis is present, with no evidence of aortic  valve stenosis.   5. The inferior  vena cava is normal in size with greater than 50%  respiratory variability, suggesting right atrial pressure of 3 mmHg.    Risk Assessment/Calculations:             Physical Exam:   VS:  BP 137/86   Pulse 84   Ht 4' 11.5" (1.511 m)   Wt 130 lb 6.4 oz (59.1 kg)   SpO2 91%   BMI 25.90 kg/m    Wt Readings from Last 3 Encounters:  03/05/24 130 lb 6.4 oz (59.1 kg)  12/18/23 130 lb (59 kg)  11/21/23 130 lb (59 kg)    GEN: Well nourished, well  developed in no acute distress NECK: No JVD; No carotid bruits CARDIAC: RRR, no murmurs, rubs, gallops, right radial 2+ and left radial 1+ pulse RESPIRATORY:  Clear to auscultation without rales, wheezing or rhonchi  ABDOMEN: Soft, non-tender, non-distended EXTREMITIES:  No edema; No deformity   ASSESSMENT AND PLAN: .    Peripheral arterial disease with known left subclavian stenosis where she continues to follow with vascular surgery at [facility].  With her increasing discomfort and decreased pulses to the left upper extremity she is being scheduled for repeated duplex to ensure that her stenosis has not worsened.  She was advised to keep her appointment with vascular surgery.  She is continued on aspirin 81 mg daily and rosuvastatin 20 mg daily.  Hyperlipidemia with a last LDL of 96 in April 2024.  She has been continued on rosuvastatin 20 mg daily that was intolerant to increased in current statin therapy.  She has been sent for an updated lipid and hepatic panel today and advised that she may need additional add-on therapy.  Lung nodule there was 8 mm seen on Myoview Lexiscan.  Chest CT was completed and she was referred to pulmonary and continues to follow with Dr. Yehuda Savannah with repeat scans.  She has a history of tobacco use and COPD.  Primary hypertension with blood pressure 137/86.  Blood pressures remain stable.  She is continued on hydrochlorothiazide 12.5 mg daily.       Dispo: Patient returns close MD/APP in 3 months or sooner if needed  Signed, Janaria Mccammon, NP

## 2024-03-06 LAB — HEPATIC FUNCTION PANEL
ALT: 37 IU/L — ABNORMAL HIGH (ref 0–32)
AST: 28 IU/L (ref 0–40)
Albumin: 4.7 g/dL (ref 3.9–4.9)
Alkaline Phosphatase: 130 IU/L — ABNORMAL HIGH (ref 44–121)
Bilirubin Total: <0.2 mg/dL (ref 0.0–1.2)
Bilirubin, Direct: <0.08 mg/dL (ref 0.00–0.40)
Total Protein: 7.1 g/dL (ref 6.0–8.5)

## 2024-03-06 LAB — LIPID PANEL
Chol/HDL Ratio: 4.4 ratio (ref 0.0–4.4)
Cholesterol, Total: 192 mg/dL (ref 100–199)
HDL: 44 mg/dL (ref >39)
LDL Chol Calc (NIH): 97 mg/dL (ref 0–99)
Triglycerides: 302 mg/dL — ABNORMAL HIGH (ref 0–149)
VLDL Cholesterol Cal: 51 mg/dL — ABNORMAL HIGH (ref 5–40)

## 2024-03-08 ENCOUNTER — Encounter: Payer: Self-pay | Admitting: *Deleted

## 2024-03-08 DIAGNOSIS — E782 Mixed hyperlipidemia: Secondary | ICD-10-CM

## 2024-03-08 NOTE — Progress Notes (Signed)
 Triglycerides remain elevated 302.  This continues to increase from last year.  Liver functions have remained stable.  LDL or bad cholesterol remains similar to previous years at 97.  Recommend starting ezetimibe 10 mg daily with repeat lipid and hepatic panel in 10 to 12 weeks.

## 2024-03-09 ENCOUNTER — Ambulatory Visit
Admission: RE | Admit: 2024-03-09 | Discharge: 2024-03-09 | Disposition: A | Discharge status: Home / Self Care | Source: Ambulatory Visit | Attending: Student in an Organized Health Care Education/Training Program | Admitting: Student in an Organized Health Care Education/Training Program

## 2024-03-09 DIAGNOSIS — R911 Solitary pulmonary nodule: Secondary | ICD-10-CM | POA: Diagnosis present

## 2024-03-09 MED ORDER — EZETIMIBE 10 MG PO TABS: 10.0000 mg | ORAL_TABLET | Freq: Every day | ORAL | 3 refills | Status: AC

## 2024-03-24 ENCOUNTER — Telehealth: Payer: Self-pay

## 2024-03-24 NOTE — Telephone Encounter (Signed)
 Copied from CRM [id number]. Topic: Clinical - Lab/Test Results >> Mar 24, 2024  1:26 PM Roseanne Cones wrote: Reason for CRM: Patient is calling to see if CT scan results are in from the imaging done on 04/08 - I advised patient that we have a shortage of radiologists in the area and it's been taking a few more weeks to get results back. Please call the patient when you have results as she's concerned that it may have grown and she has an upcomming trip. Patient scheduled to see Dr. Media Spikes in May.

## 2024-03-25 NOTE — Telephone Encounter (Signed)
 LMTCB. E2C2 please advise when patient calls back.

## 2024-03-26 NOTE — Telephone Encounter (Signed)
 I have notified the patient. Nothing further needed.

## 2024-04-02 ENCOUNTER — Ambulatory Visit: Attending: Cardiology

## 2024-04-02 DIAGNOSIS — I739 Peripheral vascular disease, unspecified: Secondary | ICD-10-CM | POA: Diagnosis not present

## 2024-04-02 DIAGNOSIS — M79602 Pain in left arm: Secondary | ICD-10-CM

## 2024-04-02 DIAGNOSIS — M25512 Pain in left shoulder: Secondary | ICD-10-CM | POA: Diagnosis not present

## 2024-04-03 ENCOUNTER — Encounter: Payer: Self-pay | Admitting: Student in an Organized Health Care Education/Training Program

## 2024-04-16 ENCOUNTER — Ambulatory Visit (INDEPENDENT_AMBULATORY_CARE_PROVIDER_SITE_OTHER): Admitting: Student in an Organized Health Care Education/Training Program

## 2024-04-16 ENCOUNTER — Encounter: Payer: Self-pay | Admitting: Student in an Organized Health Care Education/Training Program

## 2024-04-16 VITALS — BP 104/70 | HR 80 | Temp 97.1°F | Ht 59.5 in | Wt 133.8 lb

## 2024-04-16 DIAGNOSIS — R911 Solitary pulmonary nodule: Secondary | ICD-10-CM

## 2024-04-16 DIAGNOSIS — J4520 Mild intermittent asthma, uncomplicated: Secondary | ICD-10-CM | POA: Diagnosis not present

## 2024-04-16 NOTE — Progress Notes (Signed)
 Assessment & Plan:   1. Lung nodule (Primary)  Patient presented for the evaluation of a LLL pulmonary nodule that was biopsied with robotic assisted navigational bronchoscopy and cytology returning negative for malignancy. EBUS to stations 7 and 11L was also negative.   She has had a repeat CT scan of the chest last month that is showing the nodule to be stable and unchanged.  I have personally reviewed her 3 CT scans and measured the nodule, noting it to be unchanged. This is consistent with the radiology report.  I discussed this with the patient today and offered, through shared decision making, the option of repeat biopsy versus continued radiographic surveillance. Patient would prefer to continue to monitor the nodule with repeat CTs.  We will obtain a repeat chest CT in 6 months and I will see her in follow-up after that.  - CT SUPER D CHEST WO CONTRAST; Future  2. Mild intermittent asthma without complication  She is reporting an occasional wheeze and cough that is non-productive. She was told she had asthma in [place] , and is currently on Advair and Combivent. PFT obtained in December and does not suggest COPD.  - Continue Advair twice daily - Continue Combivent as needed  Return in about 5 months (around 09/20/2024).  I spent 30 minutes caring for this patient today, including preparing to see the patient, obtaining a medical history , reviewing a separately obtained history, performing a medically appropriate examination and/or evaluation, counseling and educating the patient/family/caregiver, ordering medications, tests, or procedures, and documenting clinical information in the electronic health record  Vergia Glasgow, MD [facility]  End of visit medications:  No orders of the defined types were placed in this encounter.    Current Outpatient Medications:    albuterol -ipratropium (COMBIVENT) 18-103 MCG/ACT inhaler, Inhale 2 puffs into the lungs  every 6 (six) hours as needed for wheezing or shortness of breath., Disp: , Rfl:    aspirin EC 81 MG tablet, Take 81 mg by mouth daily., Disp: , Rfl:    azelastine (OPTIVAR) 0.05 % ophthalmic solution, Place 1 drop into both eyes daily., Disp: , Rfl:    benzonatate (TESSALON) 200 MG capsule, Take 200 mg by mouth 3 (three) times daily as needed for cough., Disp: , Rfl:    bisoprolol-hydrochlorothiazide (ZIAC) 5-6.25 MG tablet, Take 0.5 tablets by mouth daily., Disp: , Rfl:    butalbital-acetaminophen -caffeine (FIORICET, ESGIC) 50-325-40 MG tablet, Take 1 tablet by mouth 2 (two) times daily., Disp: , Rfl:    Calcium  Carbonate-Vit D-Min (CALCIUM  1200 PO), Take by mouth daily., Disp: , Rfl:    Cholecalciferol (VITAMIN D ) 2000 UNITS tablet, Take 1 tablet (2,000 Units total) by mouth daily. (Patient taking differently: Take 4,000 Units by mouth daily.), Disp: 30 tablet, Rfl: PRN   COMBIVENT RESPIMAT 20-100 MCG/ACT AERS respimat, Inhale 1 puff into the lungs 2 (two) times daily., Disp: , Rfl: 1   cyanocobalamin (,VITAMIN B-12,) 1000 MCG/ML injection, every 30 (thirty) days., Disp: , Rfl:    dicyclomine (BENTYL) 10 MG capsule, take 1 capsule by mouth four times a day if needed for ABDOMINAL SPASM, Disp: , Rfl: 0   estradiol (CLIMARA - DOSED IN MG/24 HR) 0.1 mg/24hr patch, Place 0.1 mg onto the skin once a week. On Wednesday, Disp: , Rfl:    ezetimibe  (ZETIA ) 10 MG tablet, Take 1 tablet (10 mg total) by mouth daily., Disp: 90 tablet, Rfl: 3   Fluticasone-Salmeterol (ADVAIR) 100-50 MCG/DOSE AEPB, Inhale 1 puff  into the lungs 2 (two) times daily., Disp: , Rfl:    hydrochlorothiazide (HYDRODIURIL) 12.5 MG tablet, , Disp: , Rfl:    HYDROcodone-acetaminophen  (NORCO) 10-325 MG tablet, Take 1 tablet by mouth 3 (three) times daily as needed., Disp: , Rfl:    MAGNESIUM-POTASSIUM PO, Take 1 tablet by mouth daily in the afternoon., Disp: , Rfl:    meclizine (ANTIVERT) 25 MG tablet, Take 25 mg by mouth 3 (three) times  daily as needed., Disp: , Rfl:    nitroGLYCERIN (NITROSTAT) 0.4 MG SL tablet, Place 0.4 mg under the tongue every 5 (five) minutes as needed for chest pain., Disp: , Rfl:    PARoxetine (PAXIL) 30 MG tablet, Take 30 mg by mouth daily., Disp: , Rfl:    rosuvastatin  (CRESTOR ) 20 MG tablet, Take 20 mg by mouth at bedtime., Disp: , Rfl:    vitamin E 1000 UNIT capsule, Take 1,000 Units by mouth daily., Disp: , Rfl:    ADVAIR DISKUS 500-50 MCG/DOSE AEPB, , Disp: , Rfl:    albuterol  (PROVENTIL ) (2.5 MG/3ML) 0.083% nebulizer solution, as needed.  (Patient not taking: Reported on 04/16/2024), Disp: , Rfl:    Subjective:   PATIENT ID: Michelle Newton GENDER: female DOB: 04-07-1961, MRN: [id number]  Chief Complaint  Patient presents with   Follow-up    DOE. Wheezing. Cough, dry.    HPI  Patient is a pleasant 63 year old female with a past medical history of smoking who presents to clinic for follow up of her pulmonary nodule.  She has been in her usual state of health since our last visit and only complains of occasional shortness of breath with exertion.  She does not have a cough nor has she had any worsening in her wheeze.  She is planning a trip to [place]  to visit her son.  She continues to follow with vascular surgery for her left subclavian stenosis where a repeat CT scan is ordered for which she might have to undergo procedural intervention.  She is also followed by cardiology.  She is compliant with her inhalers but continues to smoke quarter pack a day.   Patient was initially referred to us  after being found to have a pulmonary nodule.  Given history of smoking as well as previous history of PAD (left iliac stent previously placed), she was ordered a stress test which was noted for a left lower lobe nodule.  This was confirmed with a CT scan of the chest performed on 10/31/2023. She underwent robotic assisted navigational bronchoscopy to the LLL pulmonary nodule on 12/18/2023 with  cytology returning negative for malignancy. Similarly, EBUS to station 7 and 11L was negative for malignancy.    Patient denies any occupational exposures, reports longstanding history of smoking.  She started smoking at age of 30. She likely has around 50 pack years of smoking history.   Past medical history is significant for PAD, asthma, and subclavian artery stenosis (left).  Ancillary information including prior medications, full medical/surgical/family/social histories, and PFTs (when available) are listed below and have been reviewed.   Review of Systems  Constitutional:  Negative for chills, fever and weight loss.  Respiratory:  Positive for cough and shortness of breath. Negative for hemoptysis, sputum production and wheezing.   Cardiovascular:  Negative for chest pain.     Objective:   Vitals:   04/16/24 0958  BP: 104/70  Pulse: 80  Temp: (!) 97.1 F (36.2 C)  SpO2: 94%  Weight: 133 lb 12.8 oz (60.7 kg)  Height: 4' 11.5" (1.511 m)   94% on RA  BMI Readings from Last 3 Encounters:  04/16/24 26.57 kg/m  03/05/24 25.90 kg/m  01/28/24 25.82 kg/m   Wt Readings from Last 3 Encounters:  04/16/24 133 lb 12.8 oz (60.7 kg)  03/05/24 130 lb 6.4 oz (59.1 kg)  12/18/23 130 lb (59 kg)    Physical Exam Constitutional:      Appearance: Normal appearance.  Cardiovascular:     Rate and Rhythm: Normal rate and regular rhythm.     Pulses: Normal pulses.     Heart sounds: Normal heart sounds.  Pulmonary:     Effort: Pulmonary effort is normal.     Breath sounds: No wheezing, rhonchi or rales.  Neurological:     General: No focal deficit present.     Mental Status: She is alert and oriented to person, place, and time. Mental status is at baseline.       Ancillary Information    Past Medical History:  Diagnosis Date   Anxiety    Asthma    CAD (coronary artery disease)    Chronic fatigue    Chronic thumb pain, bilateral 09/12/2015   COPD (chronic obstructive  pulmonary disease) (HCC)    Coronary artery disease    Depression    DJD (degenerative joint disease)    DJD (degenerative joint disease)    Essential hypertension 09/12/2015   Family history of chronic pain 09/26/2015   Fibromyalgia    Fibromyalgia    Fracture five ribs-closed 09/12/2015   H/O acute myocardial infarction 12/14/2014   H/O non-insulin dependent diabetes mellitus 09/12/2015   History of cardiac arrhythmia 09/12/2015   History of elbow surgery 09/26/2015   Left ulnar nerve transposition   History of knee surgery 09/26/2015   History of migraine 09/12/2015   History of seizures 09/12/2015   Hypercholesteremia    MI (myocardial infarction) (HCC)    Migraine    Migraine    Osteoarthritis    PAD (peripheral artery disease) (HCC)    Right shoulder: Acute Pain due to trauma 04/18/2016   Right shoulder: Chronic Pain 09/12/2015   Seizures (HCC)    Ulcerative (chronic) enterocolitis (HCC)    Vertigo      Family History  Problem Relation Age of Onset   Heart disease Father    Heart disease Brother    Heart disease Maternal Aunt    Breast cancer Maternal Aunt    Heart disease Paternal Aunt    Breast cancer Cousin      Past Surgical History:  Procedure Laterality Date   ABDOMINAL AORTA STENT  2021   ABDOMINAL HYSTERECTOMY     BACK SURGERY     BRONCHIAL BIOPSY  12/18/2023   Procedure: BRONCHIAL BIOPSIES;  Surgeon: Vergia Glasgow, MD;  Location: [facility];  Service: Pulmonary;;   BRONCHIAL BRUSHINGS  12/18/2023   Procedure: BRONCHIAL BRUSHINGS;  Surgeon: Vergia Glasgow, MD;  Location: [facility];  Service: Pulmonary;;   BRONCHIAL NEEDLE ASPIRATION BIOPSY  12/18/2023   Procedure: BRONCHIAL NEEDLE ASPIRATION BIOPSIES;  Surgeon: Vergia Glasgow, MD;  Location: [facility];  Service: Pulmonary;;   BRONCHIAL WASHINGS  12/18/2023   Procedure: BRONCHIAL WASHINGS;  Surgeon: Vergia Glasgow, MD;  Location: [facility];  Service: Pulmonary;;   KNEE ARTHROPLASTY Left     left elbow surgery     LUMBAR DISC SURGERY     ROTATOR CUFF REPAIR Right    TRIGGER FINGER RELEASE Left    ureter mesh  VIDEO BRONCHOSCOPY WITH ENDOBRONCHIAL ULTRASOUND  12/18/2023   Procedure: VIDEO BRONCHOSCOPY WITH ENDOBRONCHIAL ULTRASOUND;  Surgeon: Vergia Glasgow, MD;  Location: [facility];  Service: Pulmonary;;    Social History   Socioeconomic History   Marital status: Single    Spouse name: Not on file   Number of children: Not on file   Years of education: Not on file   Highest education level: Not on file  Occupational History   Not on file  Tobacco Use   Smoking status: Every Day    Current packs/day: 0.25    Average packs/day: 0.3 packs/day for 48.4 years (12.1 ttl pk-yrs)    Types: Cigarettes    Start date: 1977   Smokeless tobacco: Never   Tobacco comments:    0.25 PPD- khj 04/16/2024        Started smoking at 63 years old    Smoked 1 PPD at her heaviest  Vaping Use   Vaping status: Never Used  Substance and Sexual Activity   Alcohol use: No   Drug use: No   Sexual activity: Not Currently    Birth control/protection: Surgical  Other Topics Concern   Not on file  Social History Narrative   Not on file   Social Drivers of Health   Financial Resource Strain: Not on file  Food Insecurity: Not on file  Transportation Needs: Not on file  Physical Activity: Not on file  Stress: Not on file  Social Connections: Not on file  Intimate Partner Violence: Not on file     Allergies  Allergen Reactions   Cymbalta [Duloxetine Hcl] Other (See Comments)    dizzy   Duloxetine     Other reaction(s): Dizziness   Flagyl [Metronidazole] Other (See Comments)    Other reaction(s): Other (See Comments) dizzy   Gabapentin Other (See Comments)    Other Reaction(s): dizziness   Pregabalin Other (See Comments)    Other reaction(s): Dizziness  Caused eye damage  "It did damage to my eyes"  Other Reaction(s): dizziness, Not available   Vicodin  [Hydrocodone-Acetaminophen ] Nausea And Vomiting    Other reaction(s): Vomiting   Nsaids Other (See Comments) and Rash    bleeding   Silicone Rash   Tape Rash     CBC    Component Value Date/Time   WBC 7.9 12/18/2023 0716   RBC 4.16 12/18/2023 0716   HGB 14.4 12/18/2023 0716   HGB 15.1 04/18/2023 1114   HCT 42.4 12/18/2023 0716   HCT 44.1 04/18/2023 1114   PLT 252 12/18/2023 0716   PLT 356 10/26/2013 1938   MCV 101.9 (H) 12/18/2023 0716   MCV 100 (H) 04/18/2023 1114   MCV 102 (H) 10/26/2013 1938   MCH 34.6 (H) 12/18/2023 0716   MCHC 34.0 12/18/2023 0716   RDW 14.4 12/18/2023 0716   RDW 12.6 04/18/2023 1114   RDW 14.1 10/26/2013 1938   LYMPHSABS 2.7 04/18/2023 1114   LYMPHSABS 4.8 (H) 10/26/2013 1938   MONOABS 0.6 09/11/2015 0254   MONOABS 0.7 10/26/2013 1938   EOSABS 0.2 04/18/2023 1114   EOSABS 0.0 10/26/2013 1938   BASOSABS 0.1 04/18/2023 1114   BASOSABS 0.1 10/26/2013 1938    Pulmonary Functions Testing Results:    Latest Ref Rng & Units 11/28/2023   11:02 AM  PFT Results  FVC-Pre L 2.07   FVC-Predicted Pre % 75   FVC-Post L 2.09   FVC-Predicted Post % 75   Pre FEV1/FVC % % 86   Post FEV1/FCV % %  79   FEV1-Pre L 1.78   FEV1-Predicted Pre % 84   FEV1-Post L 1.66   DLCO uncorrected ml/min/mmHg 11.67   DLCO UNC% % 67   DLVA Predicted % 76   TLC L 4.14   TLC % Predicted % 94   RV % Predicted % 97     Outpatient Medications Prior to Visit  Medication Sig Dispense Refill   albuterol -ipratropium (COMBIVENT) 18-103 MCG/ACT inhaler Inhale 2 puffs into the lungs every 6 (six) hours as needed for wheezing or shortness of breath.     aspirin EC 81 MG tablet Take 81 mg by mouth daily.     azelastine (OPTIVAR) 0.05 % ophthalmic solution Place 1 drop into both eyes daily.     benzonatate (TESSALON) 200 MG capsule Take 200 mg by mouth 3 (three) times daily as needed for cough.     bisoprolol-hydrochlorothiazide (ZIAC) 5-6.25 MG tablet Take 0.5 tablets by mouth  daily.     butalbital-acetaminophen -caffeine (FIORICET, ESGIC) 50-325-40 MG tablet Take 1 tablet by mouth 2 (two) times daily.     Calcium  Carbonate-Vit D-Min (CALCIUM  1200 PO) Take by mouth daily.     Cholecalciferol (VITAMIN D ) 2000 UNITS tablet Take 1 tablet (2,000 Units total) by mouth daily. (Patient taking differently: Take 4,000 Units by mouth daily.) 30 tablet PRN   COMBIVENT RESPIMAT 20-100 MCG/ACT AERS respimat Inhale 1 puff into the lungs 2 (two) times daily.  1   cyanocobalamin (,VITAMIN B-12,) 1000 MCG/ML injection every 30 (thirty) days.     dicyclomine (BENTYL) 10 MG capsule take 1 capsule by mouth four times a day if needed for ABDOMINAL SPASM  0   estradiol (CLIMARA - DOSED IN MG/24 HR) 0.1 mg/24hr patch Place 0.1 mg onto the skin once a week. On Wednesday     ezetimibe  (ZETIA ) 10 MG tablet Take 1 tablet (10 mg total) by mouth daily. 90 tablet 3   Fluticasone-Salmeterol (ADVAIR) 100-50 MCG/DOSE AEPB Inhale 1 puff into the lungs 2 (two) times daily.     hydrochlorothiazide (HYDRODIURIL) 12.5 MG tablet      HYDROcodone-acetaminophen  (NORCO) 10-325 MG tablet Take 1 tablet by mouth 3 (three) times daily as needed.     MAGNESIUM-POTASSIUM PO Take 1 tablet by mouth daily in the afternoon.     meclizine (ANTIVERT) 25 MG tablet Take 25 mg by mouth 3 (three) times daily as needed.     nitroGLYCERIN (NITROSTAT) 0.4 MG SL tablet Place 0.4 mg under the tongue every 5 (five) minutes as needed for chest pain.     PARoxetine (PAXIL) 30 MG tablet Take 30 mg by mouth daily.     rosuvastatin  (CRESTOR ) 20 MG tablet Take 20 mg by mouth at bedtime.     vitamin E 1000 UNIT capsule Take 1,000 Units by mouth daily.     ADVAIR DISKUS 500-50 MCG/DOSE AEPB  (Patient not taking: Reported on 04/16/2024)     albuterol  (PROVENTIL ) (2.5 MG/3ML) 0.083% nebulizer solution as needed.  (Patient not taking: Reported on 04/16/2024)     cyclobenzaprine  (FLEXERIL ) 10 MG tablet Take 1 tablet (10 mg total) by mouth at  bedtime. (Patient not taking: Reported on 12/17/2023) 30 tablet 0   rosuvastatin  (CRESTOR ) 40 MG tablet Take 1 tablet (40 mg total) by mouth daily. (Patient taking differently: Take 40 mg by mouth daily. Patient only taking 20mg ) 90 tablet 3   traMADol  (ULTRAM ) 50 MG tablet Take 100 mg by mouth 2 (two) times daily. (Patient not taking: Reported on 11/21/2023)  No facility-administered medications prior to visit.

## 2024-04-20 ENCOUNTER — Telehealth: Payer: Self-pay | Admitting: Cardiology

## 2024-04-20 ENCOUNTER — Other Ambulatory Visit: Payer: Self-pay

## 2024-04-20 DIAGNOSIS — M79602 Pain in left arm: Secondary | ICD-10-CM | POA: Insufficient documentation

## 2024-04-20 DIAGNOSIS — Z5321 Procedure and treatment not carried out due to patient leaving prior to being seen by health care provider: Secondary | ICD-10-CM | POA: Diagnosis not present

## 2024-04-20 NOTE — ED Triage Notes (Signed)
 Pt to ED via POV c/o left arm pain x 3-4 months that got worse today. Pt reports she had a US  carotid done and was told she might need a stent. Has a CT scheduled. Pt took 1 hydrocodone at 8:15 and 2 ibuprofen with no relief.

## 2024-04-20 NOTE — Telephone Encounter (Signed)
 Patient called in due to severe pain to her left arm. She states it is related to blockage on the side of her neck. She reports that she is scheduled next week for a scan. Patient expresses that pain is greater than 10+ and she has taken pain pills, ibuprofen, and used ice packs with no relief. Reviewed her carotid us  that was done and reviewed that with her. She insisted that the pain is due to blockage in her neck. Advised that if her pain is that severe then she needs to proceed to local ED for further assessment and treatment. Offered appointment for next week but she did not want that since she will have had her scan. Again encouraged ED if her pain persists with that severity. She verbalized understanding.

## 2024-04-20 NOTE — Group Note (Deleted)
 Date:  04/20/2024 Time:  9:45 PM  Group Topic/Focus:  Wrap-Up Group:   The focus of this group is to help patients review their daily goal of treatment and discuss progress on daily workbooks.     Participation Level:  {BHH PARTICIPATION EAVWU:98119}  Participation Quality:  {BHH PARTICIPATION QUALITY:22265}  Affect:  {BHH AFFECT:22266}  Cognitive:  {BHH COGNITIVE:22267}  Insight: {BHH Insight2:20797}  Engagement in Group:  {BHH ENGAGEMENT IN JYNWG:95621}  Modes of Intervention:  {BHH MODES OF INTERVENTION:22269}  Additional Comments:  ***  Maglione,Preciosa Bundrick E 04/20/2024, 9:45 PM

## 2024-04-20 NOTE — Telephone Encounter (Signed)
 Patient says she's been experiencing excruciating left arm pain which has made it difficult for her to sleep. She says she's allowed 3 pain pills daily but they aren't helping and she also took ibuprofen even though she's not supposed to and it's not helping. She denies chest pain or any other symptoms, aside from pain due to fibromyalgia. She says the pain is due to blockage in her neck. Please advise.

## 2024-04-20 NOTE — ED Notes (Signed)
 Pt reviewed with Bradler MD, no new orders at this time

## 2024-04-21 ENCOUNTER — Emergency Department
Admission: EM | Admit: 2024-04-21 | Discharge: 2024-04-21 | Discharge status: Against Medical Advice | Attending: Emergency Medicine | Admitting: Emergency Medicine

## 2024-04-23 ENCOUNTER — Encounter: Payer: Self-pay | Admitting: Cardiology

## 2024-04-23 ENCOUNTER — Ambulatory Visit: Attending: Cardiology | Admitting: Cardiology

## 2024-04-23 VITALS — BP 90/57 | HR 72 | Ht 59.5 in | Wt 133.4 lb

## 2024-04-23 DIAGNOSIS — I10 Essential (primary) hypertension: Secondary | ICD-10-CM

## 2024-04-23 DIAGNOSIS — I771 Stricture of artery: Secondary | ICD-10-CM

## 2024-04-23 DIAGNOSIS — I739 Peripheral vascular disease, unspecified: Secondary | ICD-10-CM | POA: Diagnosis not present

## 2024-04-23 DIAGNOSIS — F17200 Nicotine dependence, unspecified, uncomplicated: Secondary | ICD-10-CM

## 2024-04-23 NOTE — Patient Instructions (Signed)
 Medication Instructions:  Your physician recommends that you continue on your current medications as directed. Please refer to the Current Medication list given to you today.   *If you need a refill on your cardiac medications before your next appointment, please call your pharmacy*  Lab Work: No labs ordered today   Testing/Procedures: No test ordered today   Follow-Up: At [facility], you and your health needs are our priority.  As part of our continuing mission to provide you with exceptional heart care, our providers are all part of one team.  This team includes your primary Cardiologist (physician) and Advanced Practice Providers or APPs (Physician Assistants and Nurse Practitioners) who all work together to provide you with the care you need, when you need it.  Your next appointment:   As needed  Provider:   You may see Constancia Delton, MD or one of the following Advanced Practice Providers on your designated Care Team:   Laneta Pintos, NP Gildardo Labrador, PA-C Varney Gentleman, PA-C Cadence Dallas, PA-C Ronald Cockayne, NP Morey Ar, NP

## 2024-04-23 NOTE — Progress Notes (Signed)
 Cardiology Office Note:    Date:  04/23/2024   ID:  AMEA MCPHAIL, DOB [birth year], MRN [id number]  PCP:  Claudine Cullens, MD   [facility] Providers Cardiologist:  Constancia Delton, MD     Referring MD: Claudine Cullens, MD   No chief complaint on file.   History of Present Illness:    Michelle Newton is a 63 y.o. female with a hx of hypertension, hyperlipidemia, current smoker x 40+ years, COPD, PAD s/p left iliac stent 2021  presenting for follow-up.  Previously seen due to chest pain and BP discrepancy and on.  Workup with ultrasound revealed left subclavian stenosis.  Has an appointment with vascular surgery next week.  She still complains of left arm pain.  Takes medications as prescribed.  She still smokes.    Past Medical History:  Diagnosis Date   Anxiety    Asthma    CAD (coronary artery disease)    Chronic fatigue    Chronic thumb pain, bilateral 09/12/2015   COPD (chronic obstructive pulmonary disease) (HCC)    Coronary artery disease    Depression    DJD (degenerative joint disease)    DJD (degenerative joint disease)    Essential hypertension 09/12/2015   Family history of chronic pain 09/26/2015   Fibromyalgia    Fibromyalgia    Fracture five ribs-closed 09/12/2015   H/O acute myocardial infarction 12/14/2014   H/O non-insulin dependent diabetes mellitus 09/12/2015   History of cardiac arrhythmia 09/12/2015   History of elbow surgery 09/26/2015   Left ulnar nerve transposition   History of knee surgery 09/26/2015   History of migraine 09/12/2015   History of seizures 09/12/2015   Hypercholesteremia    MI (myocardial infarction) (HCC)    Migraine    Migraine    Osteoarthritis    PAD (peripheral artery disease) (HCC)    Right shoulder: Acute Pain due to trauma 04/18/2016   Right shoulder: Chronic Pain 09/12/2015   Seizures (HCC)    Ulcerative (chronic) enterocolitis (HCC)    Vertigo     Past Surgical History:  Procedure Laterality  Date   ABDOMINAL AORTA STENT  2021   ABDOMINAL HYSTERECTOMY     BACK SURGERY     BRONCHIAL BIOPSY  12/18/2023   Procedure: BRONCHIAL BIOPSIES;  Surgeon: Vergia Glasgow, MD;  Location: [facility];  Service: Pulmonary;;   BRONCHIAL BRUSHINGS  12/18/2023   Procedure: BRONCHIAL BRUSHINGS;  Surgeon: Vergia Glasgow, MD;  Location: [facility];  Service: Pulmonary;;   BRONCHIAL NEEDLE ASPIRATION BIOPSY  12/18/2023   Procedure: BRONCHIAL NEEDLE ASPIRATION BIOPSIES;  Surgeon: Vergia Glasgow, MD;  Location: [facility];  Service: Pulmonary;;   BRONCHIAL WASHINGS  12/18/2023   Procedure: BRONCHIAL WASHINGS;  Surgeon: Vergia Glasgow, MD;  Location: [facility];  Service: Pulmonary;;   KNEE ARTHROPLASTY Left    left elbow surgery     LUMBAR DISC SURGERY     ROTATOR CUFF REPAIR Right    TRIGGER FINGER RELEASE Left    ureter mesh     VIDEO BRONCHOSCOPY WITH ENDOBRONCHIAL ULTRASOUND  12/18/2023   Procedure: VIDEO BRONCHOSCOPY WITH ENDOBRONCHIAL ULTRASOUND;  Surgeon: Vergia Glasgow, MD;  Location: [facility];  Service: Pulmonary;;    Current Medications: Current Meds  Medication Sig   ADVAIR DISKUS 500-50 MCG/DOSE AEPB    albuterol  (PROVENTIL ) (2.5 MG/3ML) 0.083% nebulizer solution as needed.   albuterol -ipratropium (COMBIVENT) 18-103 MCG/ACT inhaler Inhale 2 puffs into the lungs every 6 (six) hours as needed for wheezing or  shortness of breath.   aspirin EC 81 MG tablet Take 81 mg by mouth daily.   azelastine (OPTIVAR) 0.05 % ophthalmic solution Place 1 drop into both eyes daily.   benzonatate (TESSALON) 200 MG capsule Take 200 mg by mouth 3 (three) times daily as needed for cough.   bisoprolol-hydrochlorothiazide (ZIAC) 5-6.25 MG tablet Take 0.5 tablets by mouth daily.   butalbital-acetaminophen -caffeine (FIORICET, ESGIC) 50-325-40 MG tablet Take 1 tablet by mouth 2 (two) times daily.   Calcium  Carbonate-Vit D-Min (CALCIUM  1200 PO) Take by mouth daily.   Cholecalciferol (VITAMIN D ) 2000 UNITS  tablet Take 1 tablet (2,000 Units total) by mouth daily. (Patient taking differently: Take 4,000 Units by mouth daily.)   COMBIVENT RESPIMAT 20-100 MCG/ACT AERS respimat Inhale 1 puff into the lungs 2 (two) times daily.   cyanocobalamin (,VITAMIN B-12,) 1000 MCG/ML injection every 30 (thirty) days.   dicyclomine (BENTYL) 10 MG capsule take 1 capsule by mouth four times a day if needed for ABDOMINAL SPASM   estradiol (CLIMARA - DOSED IN MG/24 HR) 0.1 mg/24hr patch Place 0.1 mg onto the skin once a week. On Wednesday   ezetimibe  (ZETIA ) 10 MG tablet Take 1 tablet (10 mg total) by mouth daily.   Fluticasone-Salmeterol (ADVAIR) 100-50 MCG/DOSE AEPB Inhale 1 puff into the lungs 2 (two) times daily.   hydrochlorothiazide (HYDRODIURIL) 12.5 MG tablet    HYDROcodone-acetaminophen  (NORCO) 10-325 MG tablet Take 1 tablet by mouth 3 (three) times daily as needed.   MAGNESIUM-POTASSIUM PO Take 1 tablet by mouth daily in the afternoon.   meclizine (ANTIVERT) 25 MG tablet Take 25 mg by mouth 3 (three) times daily as needed.   nitroGLYCERIN (NITROSTAT) 0.4 MG SL tablet Place 0.4 mg under the tongue every 5 (five) minutes as needed for chest pain.   PARoxetine (PAXIL) 30 MG tablet Take 30 mg by mouth daily.   rosuvastatin  (CRESTOR ) 20 MG tablet Take 20 mg by mouth at bedtime.   vitamin E 1000 UNIT capsule Take 1,000 Units by mouth daily.     Allergies:   Cymbalta [duloxetine hcl], Duloxetine, Flagyl [metronidazole], Gabapentin, Pregabalin, Vicodin [hydrocodone-acetaminophen ], Nsaids, Silicone, and Tape   Social History   Socioeconomic History   Marital status: Single    Spouse name: Not on file   Number of children: Not on file   Years of education: Not on file   Highest education level: Not on file  Occupational History   Not on file  Tobacco Use   Smoking status: Every Day    Current packs/day: 0.25    Average packs/day: 0.3 packs/day for 48.4 years (12.1 ttl pk-yrs)    Types: Cigarettes    Start  date: 57   Smokeless tobacco: Never   Tobacco comments:    0.25 PPD- khj 04/16/2024        Started smoking at 63 years old    Smoked 1 PPD at her heaviest  Vaping Use   Vaping status: Never Used  Substance and Sexual Activity   Alcohol use: No   Drug use: No   Sexual activity: Not Currently    Birth control/protection: Surgical  Other Topics Concern   Not on file  Social History Narrative   Not on file   Social Drivers of Health   Financial Resource Strain: Not on file  Food Insecurity: Not on file  Transportation Needs: Not on file  Physical Activity: Not on file  Stress: Not on file  Social Connections: Not on file  Family History: The patient's family history includes Breast cancer in her cousin and maternal aunt; Heart disease in her brother, father, maternal aunt, and paternal aunt.  ROS:   Please see the history of present illness.     All other systems reviewed and are negative.  EKGs/Labs/Other Studies Reviewed:    The following studies were reviewed today:  EKG Interpretation Date/Time:  Friday Apr 23 2024 14:16:03 EDT Ventricular Rate:  72 PR Interval:  208 QRS Duration:  62 QT Interval:  402 QTC Calculation: 440 R Axis:   43  Text Interpretation: Normal sinus rhythm Low voltage QRS Confirmed by Constancia Delton ([id number]) on 04/23/2024 2:42:24 PM    Recent Labs: 12/18/2023: BUN 22; Creatinine, Ser 1.23; Hemoglobin 14.4; Platelets 252; Potassium 4.3; Sodium 138 03/05/2024: ALT 37  Recent Lipid Panel    Component Value Date/Time   CHOL 192 03/05/2024 0832   TRIG 302 (H) 03/05/2024 0832   HDL 44 03/05/2024 0832   CHOLHDL 4.4 03/05/2024 0832   LDLCALC 97 03/05/2024 0832     Risk Assessment/Calculations:             Physical Exam:    VS:  BP (!) 90/57 (BP Location: Left Arm, Patient Position: Sitting, Cuff Size: Normal)   Pulse 72   Ht 4' 11.5" (1.511 m)   Wt 133 lb 6.4 oz (60.5 kg)   SpO2 94%   BMI 26.49 kg/m     Wt Readings from  Last 3 Encounters:  04/23/24 133 lb 6.4 oz (60.5 kg)  04/20/24 130 lb (59 kg)  04/16/24 133 lb 12.8 oz (60.7 kg)     GEN:  Well nourished, well developed in no acute distress HEENT: Normal NECK: No JVD; No carotid bruits CARDIAC: RRR, no murmurs, rubs, gallops RESPIRATORY:  Clear to auscultation without rales, wheezing or rhonchi  ABDOMEN: Soft, non-tender, non-distended MUSCULOSKELETAL:  No edema; left arm pain noted. SKIN: Warm and dry NEUROLOGIC:  Alert and oriented x 3 PSYCHIATRIC:  Normal affect   ASSESSMENT:    1. Current smoker   2. Primary hypertension   3. Stenosis of left subclavian artery (HCC)   4. PAD (peripheral artery disease) (HCC)    PLAN:    In order of problems listed above:  Current smoker, smoking cessation advised.  Echo 11/24 EF 60 to 65%.  Lexiscan  Myoview  with no significant ischemia. Blood pressure difference in upper extremity,right arm systolic 132, left arm 94.  Ultrasound revealed left subclavian stenosis.  Keep appointment with vascular surgery scheduled in 6 days.  If pain persist, ED precautions advised. PAD s/p iliac stent 2021.  Follows up with vascular surgery at [facility].  Continue aspirin, Crestor  20 mg daily, Zetia  10 mg daily.  Zetia  recently added. Hypertension, BP in left arm low.  Continue bisoprolol-HCTZ 5-6.25 mg half a tablet daily  Follow-up as needed.         Medication Adjustments/Labs and Tests Ordered: Current medicines are reviewed at length with the patient today.  Concerns regarding medicines are outlined above.  Orders Placed This Encounter  Procedures   EKG 12-Lead   No orders of the defined types were placed in this encounter.   Patient Instructions  Medication Instructions:  Your physician recommends that you continue on your current medications as directed. Please refer to the Current Medication list given to you today.   *If you need a refill on your cardiac medications before your next appointment, please call  your pharmacy*  Lab Work: No labs ordered  today   Testing/Procedures: No test ordered today   Follow-Up: At [facility], you and your health needs are our priority.  As part of our continuing mission to provide you with exceptional heart care, our providers are all part of one team.  This team includes your primary Cardiologist (physician) and Advanced Practice Providers or APPs (Physician Assistants and Nurse Practitioners) who all work together to provide you with the care you need, when you need it.  Your next appointment:   As needed  Provider:   You may see Constancia Delton, MD or one of the following Advanced Practice Providers on your designated Care Team:   Laneta Pintos, NP Gildardo Labrador, PA-C Varney Gentleman, PA-C Cadence Gennaro Khat, PA-C Ronald Cockayne, NP Morey Ar, NP          Signed, Constancia Delton, MD  04/23/2024 3:43 PM    [facility]

## 2024-05-13 ENCOUNTER — Ambulatory Visit: Admitting: Dermatology

## 2024-05-20 ENCOUNTER — Ambulatory Visit (INDEPENDENT_AMBULATORY_CARE_PROVIDER_SITE_OTHER): Admitting: Dermatology

## 2024-05-20 ENCOUNTER — Encounter: Payer: Self-pay | Admitting: Dermatology

## 2024-05-20 DIAGNOSIS — R600 Localized edema: Secondary | ICD-10-CM | POA: Diagnosis not present

## 2024-05-20 DIAGNOSIS — R609 Edema, unspecified: Secondary | ICD-10-CM

## 2024-05-20 NOTE — Patient Instructions (Signed)

## 2024-05-20 NOTE — Progress Notes (Signed)
   New Patient Visit   Subjective  Michelle Newton is a 63 y.o. female who presents for the following: L cheek with edema, ~63m, pain with pressure, itchy prn, no hx of bite, trauma, piercing L lat cheek x 10 yrs, no hx of Botox or fillers injected into this area The patient has spots, moles and lesions to be evaluated, some may be new or changing and the patient may have concern these could be cancer.  New patient referral from Dr. Pastor Bong.  The following portions of the chart were reviewed this encounter and updated as appropriate: medications, allergies, medical history  Review of Systems:  No other skin or systemic complaints except as noted in HPI or Assessment and Plan.  Objective  Well appearing patient in no apparent distress; mood and affect are within normal limits.   A focused examination was performed of the following areas: face  Relevant exam findings are noted in the Assessment and Plan.    Assessment & Plan   Facial subcutaneous fullness L cheek/zygoma Exam: focal subcutaneous fullness/plethora of the L cheek/zygoma area. No focal lesions that would explain this  Treatment Plan: Plan ultrasound, orders given to patient Discussed will send mychart message to discuss results and f/u. May do xray if ultrasound isn't helpful and/or refer to ENT   EDEMA, UNSPECIFIED TYPE   Related Procedures US  Soft Tissue Head/Neck (NON-THYROID )  Return for prn pending ultrasound results.  I, Rollie Clipper, RMA, am acting as scribe for Harris Liming, MD .   Documentation: I have reviewed the above documentation for accuracy and completeness, and I agree with the above.  Harris Liming, MD

## 2024-05-26 ENCOUNTER — Ambulatory Visit
Admission: RE | Admit: 2024-05-26 | Discharge: 2024-05-26 | Disposition: A | Discharge status: Home / Self Care | Source: Ambulatory Visit | Attending: Dermatology | Admitting: Dermatology

## 2024-05-26 DIAGNOSIS — R609 Edema, unspecified: Secondary | ICD-10-CM | POA: Insufficient documentation

## 2024-05-31 ENCOUNTER — Ambulatory Visit: Payer: Self-pay | Admitting: Dermatology

## 2024-06-01 NOTE — Telephone Encounter (Signed)
-----   Message from [place] sent at 05/31/2024  8:45 PM EDT ----- Diagnosis: IMPRESSION: Negative sonographic survey of the region of clinical concern.  Plan: please call to share that ultrasound did not find a cause of the swelling on the face. If it's still present, recommend referral to ENT ----- Message ----- From: Interface, Rad Results In Sent: 05/30/2024  10:29 AM EDT To: Boneta Sharps, MD

## 2024-06-01 NOTE — Telephone Encounter (Signed)
 Left pt msg to call for ultrasound results/sh

## 2024-06-01 NOTE — Telephone Encounter (Signed)
 Patient has been advised of information. She is going to talk to PCP and aware of ENT referral can be sent if needed. aw

## 2024-06-11 ENCOUNTER — Ambulatory Visit: Admitting: Cardiology

## 2024-07-07 ENCOUNTER — Encounter: Payer: Self-pay | Admitting: Student in an Organized Health Care Education/Training Program

## 2024-08-04 ENCOUNTER — Encounter: Payer: Self-pay | Admitting: Dermatology

## 2024-08-11 ENCOUNTER — Other Ambulatory Visit: Payer: Self-pay | Admitting: Medical Genetics

## 2024-08-13 ENCOUNTER — Other Ambulatory Visit
Admission: RE | Admit: 2024-08-13 | Discharge: 2024-08-13 | Disposition: A | Discharge status: Home / Self Care | Payer: Self-pay | Source: Ambulatory Visit | Attending: Medical Genetics | Admitting: Medical Genetics

## 2024-08-13 ENCOUNTER — Other Ambulatory Visit
Admission: RE | Admit: 2024-08-13 | Discharge: 2024-08-13 | Disposition: A | Discharge status: Home / Self Care | Source: Ambulatory Visit | Attending: Cardiology | Admitting: Cardiology

## 2024-08-13 DIAGNOSIS — E782 Mixed hyperlipidemia: Secondary | ICD-10-CM | POA: Insufficient documentation

## 2024-08-13 LAB — HEPATIC FUNCTION PANEL
ALT: 68 U/L — ABNORMAL HIGH (ref 0–44)
AST: 50 U/L — ABNORMAL HIGH (ref 15–41)
Albumin: 4.5 g/dL (ref 3.5–5.0)
Alkaline Phosphatase: 114 U/L (ref 38–126)
Bilirubin, Direct: <0.1 mg/dL (ref 0.0–0.2)
Total Bilirubin: 0.5 mg/dL (ref 0.0–1.2)
Total Protein: 7.7 g/dL (ref 6.5–8.1)

## 2024-08-13 LAB — LIPID PANEL
Cholesterol: 156 mg/dL (ref 0–200)
HDL: 39 mg/dL — ABNORMAL LOW (ref >40)
LDL Cholesterol: 73 mg/dL (ref 0–99)
Total CHOL/HDL Ratio: 4 ratio
Triglycerides: 218 mg/dL — ABNORMAL HIGH (ref <150)
VLDL: 44 mg/dL — ABNORMAL HIGH (ref 0–40)

## 2024-08-17 ENCOUNTER — Ambulatory Visit: Payer: Self-pay | Admitting: Cardiology

## 2024-08-17 NOTE — Progress Notes (Signed)
 Bump in liver function noted.  Will discuss next steps on return appointment.

## 2024-08-20 ENCOUNTER — Encounter: Payer: Self-pay | Admitting: Cardiology

## 2024-08-20 ENCOUNTER — Ambulatory Visit: Attending: Cardiology | Admitting: Cardiology

## 2024-08-20 VITALS — BP 144/84 | HR 80 | Ht 59.5 in | Wt 128.4 lb

## 2024-08-20 DIAGNOSIS — I10 Essential (primary) hypertension: Secondary | ICD-10-CM

## 2024-08-20 DIAGNOSIS — I739 Peripheral vascular disease, unspecified: Secondary | ICD-10-CM

## 2024-08-20 DIAGNOSIS — Z79899 Other long term (current) drug therapy: Secondary | ICD-10-CM | POA: Diagnosis not present

## 2024-08-20 DIAGNOSIS — F17200 Nicotine dependence, unspecified, uncomplicated: Secondary | ICD-10-CM | POA: Diagnosis not present

## 2024-08-20 DIAGNOSIS — I771 Stricture of artery: Secondary | ICD-10-CM

## 2024-08-20 DIAGNOSIS — R911 Solitary pulmonary nodule: Secondary | ICD-10-CM

## 2024-08-20 NOTE — Progress Notes (Signed)
 Cardiology Office Note   Date:  08/20/2024  ID:  Michelle Newton, DOB [birth year], MRN [id number] PCP: Derick Leita POUR, MD  [facility] Providers Cardiologist:  Redell Cave, MD     History of Present Illness Michelle Newton is a 63 y.o. female with a past medical history of hypertension, hyperlipidemia, tobacco use, COPD, PAD status post left iliac stent (2021), who presents today for follow-up.   Previously was seen in October 2024 for elevated blood pressure and chest pain.  Blood pressure was different and extremities.  Arterial ultrasound (extremities, echocardiogram, Lexiscan  Myoview  ordered.  Echocardiogram revealed LVEF of 60 to 65%, no RWMA, G1 DD.  MPI showed no ischemia 8 mm lung nodule. Upper extremity ultrasound revealed stenosis of the left subclavian artery.   She previously been seen in clinic 10/22/2023 stating she was having occasional cramping that was brief but denied any chest pain and continued chronic shortness of breath likely from COPD.  Rosuvastatin  was increased to 40 mg daily referral was made to vascular surgery.  She was seen in clinic 4//2025.  She had had left arm pain that was worse when she was lying down and extends down her arm into her hand and she was worried that her subclavian stenosis had worsened.  Previously she was advised to increase her rosuvastatin  to 40 mg daily and that she was only taking 20 mg due to diarrhea.  She states her back on 20 mg and is tolerating the medication without issues.   She was last seen in clinic 04/23/2024.  Still complained of left arm pain but had an upcoming appointment with vascular surgery for left subclavian stenosis.  Zetia  10 mg daily was recently added to her medication regimen for cholesterol management.  She returns clinic today stating that she still has discomfort in her left arm.  Followed up with vascular was advised since she still continues to have good blood flow to the hand and that there is no  intervention that needs to be completed at this time.  She states that she has been compliant with her current medication regimen without any undue side effects.  Denies any recent hospitalizations or visits to the emergency department.   ROS: 10 point review of systems has been reviewed and considered negative exception was listed in the HPI  Studies Reviewed      Myoview  lexiscan  10/24/23 Narrative & Impression      Normal pharmacologic myocardial perfusion stress test without evidence of significant ischemia or scar.   Left ventricular systolic function is normal (LVEF > 65%).   There is no significant coronary artery calcification.  Aortic atherosclerosis is present.   An 8 mm nodule is identified in the left lower lobe.  Further evaluation with dedicated chest CT is recommended.   This is a low-risk study.      Echo 10/2023    1. Left ventricular ejection fraction, by estimation, is 60 to 65%. The  left ventricle has normal function. The left ventricle has no regional  wall motion abnormalities. Left ventricular diastolic parameters are  consistent with Grade I diastolic  dysfunction (impaired relaxation). The average left ventricular global  longitudinal strain is -16.6 %.   2. Right ventricular systolic function is normal. The right ventricular  size is normal. There is normal pulmonary artery systolic pressure. The  estimated right ventricular systolic pressure is 30.4 mmHg.   3. The mitral valve is normal in structure. No evidence of mitral valve  regurgitation. No  evidence of mitral stenosis.   4. The aortic valve is tricuspid. Aortic valve regurgitation is not  visualized. Aortic valve sclerosis is present, with no evidence of aortic  valve stenosis.   5. The inferior vena cava is normal in size with greater than 50%  respiratory variability, suggesting right atrial pressure of 3 mmHg.    Risk Assessment/Calculations     Physical Exam VS:  BP (!) 144/84 (BP Location:  Right Arm, Patient Position: Sitting, Cuff Size: Normal)   Pulse 80   Ht 4' 11.5 (1.511 m)   Wt 128 lb 6.4 oz (58.2 kg)   SpO2 96%   BMI 25.50 kg/m        Wt Readings from Last 3 Encounters:  08/20/24 128 lb 6.4 oz (58.2 kg)  04/23/24 133 lb 6.4 oz (60.5 kg)  04/20/24 130 lb (59 kg)    GEN: Well nourished, well developed in no acute distress NECK: No JVD; No carotid bruits CARDIAC: RRR, no murmurs, rubs, gallops RESPIRATORY:  Clear to auscultation without rales, wheezing or rhonchi  ABDOMEN: Soft, non-tender, non-distended EXTREMITIES:  No edema; No deformity , right radial 2+ pulses left radial 1+ palpable pulse  ASSESSMENT AND PLAN Peripheral arterial disease with noted left subclavian stenosis but she continues to follow with vascular surgery at [facility].  She continues to complain of discomfort in the left upper extremity but has been advised at this time there is no intervention to be completed.  She has been continued on aspirin 81 mg daily, rosuvastatin  20 mg daily and ezetimibe  10 mg daily.  Hyperlipidemia with an LDL that is down to 73 from previous of 196 in April.  She has been continued on rosuvastatin  20 mg daily and she was intolerant to increase the dose and she was continued on ezetimibe  10 mg daily.  Of note her last AST and ALT was elevated.  She states that she was also new pain medication and has not been able to get her liver functions.  After further discussion we will repeat an additional hepatic panel in 3 months if she continues to have elevated we will discontinue ezetimibe .  Lung nodule which she originally had an 8 mm nodule it was seen on Myoview  Lexiscan .  Chest CT was completed and she continues to follow with pulmonary.  She states she has a new scan that is upcoming and is concerned about lung cancer at this point.  Primary hypertension with a blood pressure today 140/84.  Blood pressure slightly elevated today as she was rushing to get here.  She has  been continued on hydrochlorothiazide 12.5 mg daily.  She has been encouraged to monitor pressures 1 to 2 hours postmedication administration as well.  Tobacco abuse with total cessation recommended she has been working to decrease the amount she smokes daily.       Dispo: Patient to return to clinic to see me/APP in 3 months or sooner if needed for further evaluation.  Signed, Mattheo Swindle, NP

## 2024-08-20 NOTE — Patient Instructions (Signed)
 Medication Instructions:  Your physician recommends that you continue on your current medications as directed. Please refer to the Current Medication list given to you today.   *If you need a refill on your cardiac medications before your next appointment, please call your pharmacy*  Lab Work: Your provider would like for you to return in 10 weeks to have the following labs drawn: Hepatic panel.   Please go to [facility] [address] ([facility] [address], [place] [address] You do not need an appointment.  They are open from 8 am- 4:30 pm.  Lunch from 1:00 pm- 2:00 pm You DO need to be fasting.   You may also go to one of the following [facility]:  [address] [place], [place] [address] Phone: [phone] Lab hours: Mon-Fri 8 am- 5 pm    Lunch 12 pm- 1 pm  [address] [place],  [place]  [address]  [place]  Phone: [phone] Lab hours: 7 am- 4 pm Lunch 12 pm-1 pm   [address] [place],  [place]  [address]  [place]  Phone: [phone] Lab hours: Mon-Fri 8 am- 5 pm    Lunch 12 pm- 1 pm  If you have labs (blood work) drawn today and your tests are completely normal, you will receive your results only by: MyChart Message (if you have MyChart) OR A paper copy in the mail If you have any lab test that is abnormal or we need to change your treatment, we will call you to review the results.  Testing/Procedures: No test ordered today   Follow-Up: At [facility], you and your health needs are our priority.  As part of our continuing mission to provide you with exceptional heart care, our providers are all part of one team.  This team includes your primary Cardiologist (physician) and Advanced Practice Providers or APPs (Physician Assistants and Nurse Practitioners) who all work together to provide you with the care you need, when you need it.  Your next appointment:   3 month(s)  Provider:   You may see Redell Cave, MD or one of the following  Advanced Practice Providers on your designated Care Team:   Lonni Meager, NP Lesley Maffucci, PA-C Bernardino Bring, PA-C Cadence Roosevelt, PA-C Tylene Lunch, NP Barnie Hila, NP

## 2024-08-23 LAB — GENECONNECT MOLECULAR SCREEN: Genetic Analysis Overall Interpretation: NEGATIVE

## 2024-08-27 ENCOUNTER — Other Ambulatory Visit: Payer: Self-pay | Admitting: Family Medicine

## 2024-08-27 DIAGNOSIS — Z1231 Encounter for screening mammogram for malignant neoplasm of breast: Secondary | ICD-10-CM

## 2024-09-06 ENCOUNTER — Ambulatory Visit
Admission: RE | Admit: 2024-09-06 | Discharge: 2024-09-06 | Disposition: A | Discharge status: Home / Self Care | Source: Ambulatory Visit | Attending: Student in an Organized Health Care Education/Training Program | Admitting: Student in an Organized Health Care Education/Training Program

## 2024-09-06 DIAGNOSIS — R911 Solitary pulmonary nodule: Secondary | ICD-10-CM | POA: Insufficient documentation

## 2024-09-10 ENCOUNTER — Ambulatory Visit: Payer: Self-pay | Admitting: Student in an Organized Health Care Education/Training Program

## 2024-09-21 ENCOUNTER — Encounter

## 2024-09-22 ENCOUNTER — Telehealth: Payer: Self-pay

## 2024-09-22 ENCOUNTER — Ambulatory Visit (INDEPENDENT_AMBULATORY_CARE_PROVIDER_SITE_OTHER): Admitting: Student in an Organized Health Care Education/Training Program

## 2024-09-22 ENCOUNTER — Encounter: Payer: Self-pay | Admitting: Student in an Organized Health Care Education/Training Program

## 2024-09-22 VITALS — BP 126/92 | HR 85 | Temp 97.6°F | Ht 59.5 in | Wt 122.2 lb

## 2024-09-22 DIAGNOSIS — R911 Solitary pulmonary nodule: Secondary | ICD-10-CM

## 2024-09-22 NOTE — Telephone Encounter (Signed)
 Bronchoscopy 10/05/2024 7:30 AM R91.1  CPT Code: 68372, [id number], [id number], [id number], [id number], [id number], [id number]  [place] please see Bronch info.

## 2024-09-22 NOTE — Progress Notes (Signed)
 Assessment & Plan:   #Lung nodule (Primary)  Presents for follow up of the LLL pulmonary nodule that was previously biopsied with negative cytology (EBUS/TBNA at stations 7 and 11L also negative). She's had repeat chest imaging with radiology read reporting possible small interval growth. Given this, discussed with patient and recommended obtaining a PET/CT to establish FDG avidity, and consideration of repeat bronchoscopy to establish the diagnosis.  We discussed the importance of diagnosis and staging in lung malignancies, and the approach to obtaining a tissue diagnosis which would include robotic assisted navigational bronchoscopy with endobronchial ultrasound guided sampling.  We also discussed the risks associated with the procedure which include a 2% risk of pneumothorax, infection, bleeding, and nondiagnostic procedure in detail.  I explained that patients typically are able to return home the same day of the procedure, but in rare cases admission to the hospital for observation and treatment is required.  After our discussion, the patient elected to proceed with the procedure  Recommendations:  - Procedural/ Surgical Case Request: VIDEO BRONCHOSCOPY WITH ENDOBRONCHIAL NAVIGATION; Future - NM PET Image Initial (PI) Skull Base To Thigh (F-18 FDG); Future   #Mild Intermittent Asthma  Continue Advair twice daily. Recommended smoking cessation.  I spent 30 minutes caring for this patient today, including preparing to see the patient, obtaining a medical history , reviewing a separately obtained history, performing a medically appropriate examination and/or evaluation, counseling and educating the patient/family/caregiver, ordering medications, tests, or procedures, documenting clinical information in the electronic health record, and independently interpreting results (not separately reported/billed) and communicating results to the patient/family/caregiver  Michelle November, MD [facility]   End of visit medications:  No orders of the defined types were placed in this encounter.    Current Outpatient Medications:    ADVAIR DISKUS 500-50 MCG/DOSE AEPB, , Disp: , Rfl:    albuterol  (PROVENTIL ) (2.5 MG/3ML) 0.083% nebulizer solution, as needed., Disp: , Rfl:    albuterol -ipratropium (COMBIVENT) 18-103 MCG/ACT inhaler, Inhale 2 puffs into the lungs every 6 (six) hours as needed for wheezing or shortness of breath., Disp: , Rfl:    aspirin EC 81 MG tablet, Take 81 mg by mouth daily., Disp: , Rfl:    benzonatate (TESSALON) 200 MG capsule, Take 200 mg by mouth 3 (three) times daily as needed for cough., Disp: , Rfl:    bisoprolol-hydrochlorothiazide (ZIAC) 5-6.25 MG tablet, Take 0.5 tablets by mouth daily., Disp: , Rfl:    butalbital-acetaminophen -caffeine (FIORICET, ESGIC) 50-325-40 MG tablet, Take 1 tablet by mouth 2 (two) times daily., Disp: , Rfl:    Calcium  Carbonate-Vit D-Min (CALCIUM  1200 PO), Take by mouth daily., Disp: , Rfl:    Cholecalciferol (VITAMIN D ) 2000 UNITS tablet, Take 1 tablet (2,000 Units total) by mouth daily. (Patient taking differently: Take 4,000 Units by mouth daily.), Disp: 30 tablet, Rfl: PRN   cyanocobalamin (,VITAMIN B-12,) 1000 MCG/ML injection, every 30 (thirty) days., Disp: , Rfl:    dicyclomine (BENTYL) 10 MG capsule, take 1 capsule by mouth four times a day if needed for ABDOMINAL SPASM, Disp: , Rfl: 0   estradiol (CLIMARA - DOSED IN MG/24 HR) 0.1 mg/24hr patch, Place 0.1 mg onto the skin once a week. On Wednesday (Patient taking differently: Place 0.5 mg onto the skin once a week. On Wednesday), Disp: , Rfl:    ezetimibe  (ZETIA ) 10 MG tablet, Take 1 tablet (10 mg total) by mouth daily., Disp: 90 tablet, Rfl: 3   hydrochlorothiazide (HYDRODIURIL) 12.5 MG tablet, ,  Disp: , Rfl:    HYDROcodone-acetaminophen  (NORCO) 10-325 MG tablet, Take 1 tablet by mouth 3 (three) times daily as needed., Disp: , Rfl:    MAGNESIUM-POTASSIUM PO,  Take 1 tablet by mouth daily in the afternoon., Disp: , Rfl:    meclizine (ANTIVERT) 25 MG tablet, Take 25 mg by mouth 3 (three) times daily as needed., Disp: , Rfl:    nitroGLYCERIN (NITROSTAT) 0.4 MG SL tablet, Place 0.4 mg under the tongue every 5 (five) minutes as needed for chest pain., Disp: , Rfl:    PARoxetine (PAXIL) 30 MG tablet, Take 30 mg by mouth daily., Disp: , Rfl:    rosuvastatin  (CRESTOR ) 20 MG tablet, Take 20 mg by mouth at bedtime., Disp: , Rfl:    vitamin E 1000 UNIT capsule, Take 1,000 Units by mouth daily., Disp: , Rfl:    azelastine (OPTIVAR) 0.05 % ophthalmic solution, Place 1 drop into both eyes daily., Disp: , Rfl:    COMBIVENT RESPIMAT 20-100 MCG/ACT AERS respimat, Inhale 1 puff into the lungs 2 (two) times daily. (Patient not taking: Reported on 09/22/2024), Disp: , Rfl: 1   Fluticasone-Salmeterol (ADVAIR) 100-50 MCG/DOSE AEPB, Inhale 1 puff into the lungs 2 (two) times daily. (Patient not taking: Reported on 09/22/2024), Disp: , Rfl:    promethazine (PHENERGAN) 25 MG tablet, Take 25 mg by mouth every 6 (six) hours as needed for nausea or vomiting., Disp: , Rfl:    Subjective:   PATIENT ID: Michelle Newton GENDER: female DOB: 1961-03-18, MRN: [id number]  Chief Complaint  Patient presents with   Lung Mass    HPI  Patient is a pleasant 63 year old female with a history of smoking and asthma presenting for follow up of her pulmonary nodule.  Return Visit 04/16/2024:   She has been in her usual state of health since our last visit and only complains of occasional shortness of breath with exertion.  She does not have a cough nor has she had any worsening in her wheeze.  She is planning a trip to [place]  to visit her son.  She continues to follow with vascular surgery for her left subclavian stenosis where a repeat CT scan is ordered for which she might have to undergo procedural intervention.  She is also followed by cardiology.  She is compliant with her  inhalers but continues to smoke quarter pack a day.   Patient was initially referred to us  after being found to have a pulmonary nodule.  Given history of smoking as well as previous history of PAD (left iliac stent previously placed), she was ordered a stress test which was noted for a left lower lobe nodule.  This was confirmed with a CT scan of the chest performed on 10/31/2023. She underwent robotic assisted navigational bronchoscopy to the LLL pulmonary nodule on 12/18/2023 with cytology returning negative for malignancy. Similarly, EBUS to station 7 and 11L was negative for malignancy.  Return Visit 09/22/2024:  Returns for follow up after repeat chest imaging showed a mild interval increase in the size of her pulmonary nodule. She continues to smoke cigarettes. She continues to have respiratory symptoms, with cough and shortness of breath reported. She does have an occasional wheeze. She has not had any exacerbations.  Patient denies any occupational exposures, reports longstanding history of smoking.  She started smoking at age of 56. She likely has around 50 pack years of smoking history.   Past medical history is significant for PAD, asthma, and subclavian artery stenosis (left).  Ancillary information including prior medications, full medical/surgical/family/social histories, and PFTs (when available) are listed below and have been reviewed.   Review of Systems  Constitutional:  Negative for chills, fever and weight loss.  Respiratory:  Positive for cough and wheezing. Negative for hemoptysis, sputum production and shortness of breath.   Cardiovascular:  Negative for chest pain.     Objective:   Vitals:   09/22/24 0940  BP: (!) 126/92  Pulse: 85  Temp: 97.6 F (36.4 C)  TempSrc: Temporal  SpO2: 95%  Weight: 122 lb 3.2 oz (55.4 kg)  Height: 4' 11.5 (1.511 m)   95% on RA  BMI Readings from Last 3 Encounters:  09/22/24 24.27 kg/m  08/20/24 25.50 kg/m  04/23/24 26.49  kg/m   Wt Readings from Last 3 Encounters:  09/22/24 122 lb 3.2 oz (55.4 kg)  08/20/24 128 lb 6.4 oz (58.2 kg)  04/23/24 133 lb 6.4 oz (60.5 kg)    Physical Exam Constitutional:      Appearance: Normal appearance. She is not ill-appearing.  HENT:     Head: Normocephalic and atraumatic.     Mouth/Throat:     Mouth: Mucous membranes are moist.  Cardiovascular:     Rate and Rhythm: Normal rate and regular rhythm.     Pulses: Normal pulses.     Heart sounds: Normal heart sounds.  Pulmonary:     Effort: Pulmonary effort is normal.     Breath sounds: Wheezing (scattered, faint) present.  Abdominal:     Tenderness: There is no abdominal tenderness.  Neurological:     General: No focal deficit present.     Mental Status: She is alert and oriented to person, place, and time. Mental status is at baseline.       Ancillary Information    Past Medical History:  Diagnosis Date   Anxiety    Asthma    CAD (coronary artery disease)    Chronic fatigue    Chronic thumb pain, bilateral 09/12/2015   COPD (chronic obstructive pulmonary disease) (HCC)    Coronary artery disease    Depression    DJD (degenerative joint disease)    DJD (degenerative joint disease)    Essential hypertension 09/12/2015   Family history of chronic pain 09/26/2015   Fibromyalgia    Fibromyalgia    Fracture five ribs-closed 09/12/2015   H/O acute myocardial infarction 12/14/2014   H/O non-insulin dependent diabetes mellitus 09/12/2015   History of cardiac arrhythmia 09/12/2015   History of elbow surgery 09/26/2015   Left ulnar nerve transposition   History of knee surgery 09/26/2015   History of migraine 09/12/2015   History of seizures 09/12/2015   Hypercholesteremia    MI (myocardial infarction) (HCC)    Migraine    Migraine    Osteoarthritis    PAD (peripheral artery disease)    Right shoulder: Acute Pain due to trauma 04/18/2016   Right shoulder: Chronic Pain 09/12/2015   Seizures (HCC)     Ulcerative (chronic) enterocolitis (HCC)    Vertigo      Family History  Problem Relation Age of Onset   Heart disease Father    Heart disease Brother    Heart disease Maternal Aunt    Breast cancer Maternal Aunt    Heart disease Paternal Aunt    Breast cancer Cousin      Past Surgical History:  Procedure Laterality Date   ABDOMINAL AORTA STENT  2021   ABDOMINAL HYSTERECTOMY     BACK SURGERY  BRONCHIAL BIOPSY  12/18/2023   Procedure: BRONCHIAL BIOPSIES;  Surgeon: Isadora Hose, MD;  Location: [facility] END[facility];  Service: Pulmonary;;   BRONCHIAL BRUSHINGS  12/18/2023   Procedure: BRONCHIAL BRUSHINGS;  Surgeon: Isadora Hose, MD;  Location: [facility] END[facility]COPY;  Service: Pulmonary;;   BRONCHIAL NEEDLE ASPIRATION BIOPSY  12/18/2023   Procedure: BRONCHIAL NEEDLE ASPIRATION BIOPSIES;  Surgeon: Isadora Hose, MD;  Location: [facility]OSCOPY;  Service: Pulmonary;;   BRONCHIAL WASHINGS  12/18/2023   Procedure: BRONCHIAL WASHINGS;  Surgeon: Isadora Hose, MD;  Location: [facility] ENDOS[facility];  Service: Pulmonary;;   KNEE ARTHROPLASTY Left    left elbow surgery     LUMBAR DISC SURGERY     ROTATOR CUFF REPAIR Right    TRIGGER FINGER RELEASE Left    ureter mesh     VIDEO BRONCHOSCOPY WITH ENDOBRONCHIAL ULTRASOUND  12/18/2023   Procedure: VIDEO BRONCHOSCOPY WITH ENDOBRONCHIAL ULTRASOUND;  Surgeon: Isadora Hose, MD;  Location: [facility];  Service: Pulmonary;;    Social History   Socioeconomic History   Marital status: Single    Spouse name: Not on file   Number of children: Not on file   Years of education: Not on file   Highest education level: Not on file  Occupational History   Not on file  Tobacco Use   Smoking status: Every Day    Current packs/day: 0.25    Average packs/day: 0.3 packs/day for 48.8 years (12.2 ttl pk-yrs)    Types: Cigarettes    Start date: 1977   Smokeless tobacco: Never   Tobacco comments:    0.25 PPD- khj 04/16/2024        Started smoking at 63 years old    Smoked 1  PPD at her heaviest  Vaping Use   Vaping status: Never Used  Substance and Sexual Activity   Alcohol use: No   Drug use: No   Sexual activity: Not Currently    Birth control/protection: Surgical  Other Topics Concern   Not on file  Social History Narrative   Not on file   Social Drivers of Health   Financial Resource Strain: Not on file  Food Insecurity: Not on file  Transportation Needs: Not on file  Physical Activity: Not on file  Stress: Not on file  Social Connections: Not on file  Intimate Partner Violence: Not on file     Allergies  Allergen Reactions   Cymbalta [Duloxetine Hcl] Other (See Comments)    dizzy   Duloxetine     Other reaction(s): Dizziness   Flagyl [Metronidazole] Other (See Comments)    Other reaction(s): Other (See Comments) dizzy   Gabapentin Other (See Comments)    Other Reaction(s): dizziness   Pregabalin Other (See Comments)    Other reaction(s): Dizziness  Caused eye damage  It did damage to my eyes  Other Reaction(s): dizziness, Not available   Vicodin [Hydrocodone-Acetaminophen ] Nausea And Vomiting    Other reaction(s): Vomiting   Nsaids Other (See Comments) and Rash    bleeding   Silicone Rash   Tape Rash     CBC    Component Value Date/Time   WBC 7.9 12/18/2023 0716   RBC 4.16 12/18/2023 0716   HGB 14.4 12/18/2023 0716   HGB 15.1 04/18/2023 1114   HCT 42.4 12/18/2023 0716   HCT 44.1 04/18/2023 1114   PLT 252 12/18/2023 0716   PLT 356 10/26/2013 1938   MCV 101.9 (H) 12/18/2023 0716   MCV 100 (H) 04/18/2023 1114   MCV 102 (H) 10/26/2013 1938  MCH 34.6 (H) 12/18/2023 0716   MCHC 34.0 12/18/2023 0716   RDW 14.4 12/18/2023 0716   RDW 12.6 04/18/2023 1114   RDW 14.1 10/26/2013 1938   LYMPHSABS 2.7 04/18/2023 1114   LYMPHSABS 4.8 (H) 10/26/2013 1938   MONOABS 0.6 09/11/2015 0254   MONOABS 0.7 10/26/2013 1938   EOSABS 0.2 04/18/2023 1114   EOSABS 0.0 10/26/2013 1938   BASOSABS 0.1 04/18/2023 1114   BASOSABS 0.1  10/26/2013 1938    Pulmonary Functions Testing Results:    Latest Ref Rng & Units 11/28/2023   11:02 AM  PFT Results  FVC-Pre L 2.07   FVC-Predicted Pre % 75   FVC-Post L 2.09   FVC-Predicted Post % 75   Pre FEV1/FVC % % 86   Post FEV1/FCV % % 79   FEV1-Pre L 1.78   FEV1-Predicted Pre % 84   FEV1-Post L 1.66   DLCO uncorrected ml/min/mmHg 11.67   DLCO UNC% % 67   DLVA Predicted % 76   TLC L 4.14   TLC % Predicted % 94   RV % Predicted % 97     Outpatient Medications Prior to Visit  Medication Sig Dispense Refill   ADVAIR DISKUS 500-50 MCG/DOSE AEPB      albuterol  (PROVENTIL ) (2.5 MG/3ML) 0.083% nebulizer solution as needed.     albuterol -ipratropium (COMBIVENT) 18-103 MCG/ACT inhaler Inhale 2 puffs into the lungs every 6 (six) hours as needed for wheezing or shortness of breath.     aspirin EC 81 MG tablet Take 81 mg by mouth daily.     benzonatate (TESSALON) 200 MG capsule Take 200 mg by mouth 3 (three) times daily as needed for cough.     bisoprolol-hydrochlorothiazide (ZIAC) 5-6.25 MG tablet Take 0.5 tablets by mouth daily.     butalbital-acetaminophen -caffeine (FIORICET, ESGIC) 50-325-40 MG tablet Take 1 tablet by mouth 2 (two) times daily.     Calcium  Carbonate-Vit D-Min (CALCIUM  1200 PO) Take by mouth daily.     Cholecalciferol (VITAMIN D ) 2000 UNITS tablet Take 1 tablet (2,000 Units total) by mouth daily. (Patient taking differently: Take 4,000 Units by mouth daily.) 30 tablet PRN   cyanocobalamin (,VITAMIN B-12,) 1000 MCG/ML injection every 30 (thirty) days.     dicyclomine (BENTYL) 10 MG capsule take 1 capsule by mouth four times a day if needed for ABDOMINAL SPASM  0   estradiol (CLIMARA - DOSED IN MG/24 HR) 0.1 mg/24hr patch Place 0.1 mg onto the skin once a week. On Wednesday (Patient taking differently: Place 0.5 mg onto the skin once a week. On Wednesday)     ezetimibe  (ZETIA ) 10 MG tablet Take 1 tablet (10 mg total) by mouth daily. 90 tablet 3    hydrochlorothiazide (HYDRODIURIL) 12.5 MG tablet  (Patient taking differently: PRN)     HYDROcodone-acetaminophen  (NORCO) 10-325 MG tablet Take 1 tablet by mouth 3 (three) times daily as needed.     MAGNESIUM-POTASSIUM PO Take 1 tablet by mouth daily in the afternoon.     meclizine (ANTIVERT) 25 MG tablet Take 25 mg by mouth 3 (three) times daily as needed.     nitroGLYCERIN (NITROSTAT) 0.4 MG SL tablet Place 0.4 mg under the tongue every 5 (five) minutes as needed for chest pain.     PARoxetine (PAXIL) 30 MG tablet Take 30 mg by mouth daily.     rosuvastatin  (CRESTOR ) 20 MG tablet Take 20 mg by mouth at bedtime.     vitamin E 1000 UNIT capsule Take 1,000 Units by  mouth daily.     azelastine (OPTIVAR) 0.05 % ophthalmic solution Place 1 drop into both eyes daily.     COMBIVENT RESPIMAT 20-100 MCG/ACT AERS respimat Inhale 1 puff into the lungs 2 (two) times daily. (Patient not taking: Reported on 09/22/2024)  1   Fluticasone-Salmeterol (ADVAIR) 100-50 MCG/DOSE AEPB Inhale 1 puff into the lungs 2 (two) times daily. (Patient not taking: Reported on 09/22/2024)     promethazine (PHENERGAN) 25 MG tablet Take 25 mg by mouth every 6 (six) hours as needed for nausea or vomiting.     No facility-administered medications prior to visit.

## 2024-09-22 NOTE — H&P (View-Only) (Signed)
 Assessment & Plan:   #Lung nodule (Primary)  Presents for follow up of the LLL pulmonary nodule that was previously biopsied with negative cytology (EBUS/TBNA at stations 7 and 11L also negative). She's had repeat chest imaging with radiology read reporting possible small interval growth. Given this, discussed with patient and recommended obtaining a PET/CT to establish FDG avidity, and consideration of repeat bronchoscopy to establish the diagnosis.  We discussed the importance of diagnosis and staging in lung malignancies, and the approach to obtaining a tissue diagnosis which would include robotic assisted navigational bronchoscopy with endobronchial ultrasound guided sampling.  We also discussed the risks associated with the procedure which include a 2% risk of pneumothorax, infection, bleeding, and nondiagnostic procedure in detail.  I explained that patients typically are able to return home the same day of the procedure, but in rare cases admission to the hospital for observation and treatment is required.  After our discussion, the patient elected to proceed with the procedure  Recommendations:  - Procedural/ Surgical Case Request: VIDEO BRONCHOSCOPY WITH ENDOBRONCHIAL NAVIGATION; Future - NM PET Image Initial (PI) Skull Base To Thigh (F-18 FDG); Future   #Mild Intermittent Asthma  Continue Advair twice daily. Recommended smoking cessation.  I spent 30 minutes caring for this patient today, including preparing to see the patient, obtaining a medical history , reviewing a separately obtained history, performing a medically appropriate examination and/or evaluation, counseling and educating the patient/family/caregiver, ordering medications, tests, or procedures, documenting clinical information in the electronic health record, and independently interpreting results (not separately reported/billed) and communicating results to the patient/family/caregiver  Michelle November, MD [facility]   End of visit medications:  No orders of the defined types were placed in this encounter.    Current Outpatient Medications:    ADVAIR DISKUS 500-50 MCG/DOSE AEPB, , Disp: , Rfl:    albuterol  (PROVENTIL ) (2.5 MG/3ML) 0.083% nebulizer solution, as needed., Disp: , Rfl:    albuterol -ipratropium (COMBIVENT) 18-103 MCG/ACT inhaler, Inhale 2 puffs into the lungs every 6 (six) hours as needed for wheezing or shortness of breath., Disp: , Rfl:    aspirin EC 81 MG tablet, Take 81 mg by mouth daily., Disp: , Rfl:    benzonatate (TESSALON) 200 MG capsule, Take 200 mg by mouth 3 (three) times daily as needed for cough., Disp: , Rfl:    bisoprolol-hydrochlorothiazide (ZIAC) 5-6.25 MG tablet, Take 0.5 tablets by mouth daily., Disp: , Rfl:    butalbital-acetaminophen -caffeine (FIORICET, ESGIC) 50-325-40 MG tablet, Take 1 tablet by mouth 2 (two) times daily., Disp: , Rfl:    Calcium  Carbonate-Vit D-Min (CALCIUM  1200 PO), Take by mouth daily., Disp: , Rfl:    Cholecalciferol (VITAMIN D ) 2000 UNITS tablet, Take 1 tablet (2,000 Units total) by mouth daily. (Patient taking differently: Take 4,000 Units by mouth daily.), Disp: 30 tablet, Rfl: PRN   cyanocobalamin (,VITAMIN B-12,) 1000 MCG/ML injection, every 30 (thirty) days., Disp: , Rfl:    dicyclomine (BENTYL) 10 MG capsule, take 1 capsule by mouth four times a day if needed for ABDOMINAL SPASM, Disp: , Rfl: 0   estradiol (CLIMARA - DOSED IN MG/24 HR) 0.1 mg/24hr patch, Place 0.1 mg onto the skin once a week. On Wednesday (Patient taking differently: Place 0.5 mg onto the skin once a week. On Wednesday), Disp: , Rfl:    ezetimibe  (ZETIA ) 10 MG tablet, Take 1 tablet (10 mg total) by mouth daily., Disp: 90 tablet, Rfl: 3   hydrochlorothiazide (HYDRODIURIL) 12.5 MG tablet, ,  Disp: , Rfl:    HYDROcodone-acetaminophen  (NORCO) 10-325 MG tablet, Take 1 tablet by mouth 3 (three) times daily as needed., Disp: , Rfl:    MAGNESIUM-POTASSIUM PO,  Take 1 tablet by mouth daily in the afternoon., Disp: , Rfl:    meclizine (ANTIVERT) 25 MG tablet, Take 25 mg by mouth 3 (three) times daily as needed., Disp: , Rfl:    nitroGLYCERIN (NITROSTAT) 0.4 MG SL tablet, Place 0.4 mg under the tongue every 5 (five) minutes as needed for chest pain., Disp: , Rfl:    PARoxetine (PAXIL) 30 MG tablet, Take 30 mg by mouth daily., Disp: , Rfl:    rosuvastatin  (CRESTOR ) 20 MG tablet, Take 20 mg by mouth at bedtime., Disp: , Rfl:    vitamin E 1000 UNIT capsule, Take 1,000 Units by mouth daily., Disp: , Rfl:    azelastine (OPTIVAR) 0.05 % ophthalmic solution, Place 1 drop into both eyes daily., Disp: , Rfl:    COMBIVENT RESPIMAT 20-100 MCG/ACT AERS respimat, Inhale 1 puff into the lungs 2 (two) times daily. (Patient not taking: Reported on 09/22/2024), Disp: , Rfl: 1   Fluticasone-Salmeterol (ADVAIR) 100-50 MCG/DOSE AEPB, Inhale 1 puff into the lungs 2 (two) times daily. (Patient not taking: Reported on 09/22/2024), Disp: , Rfl:    promethazine (PHENERGAN) 25 MG tablet, Take 25 mg by mouth every 6 (six) hours as needed for nausea or vomiting., Disp: , Rfl:    Subjective:   PATIENT ID: Michelle Newton GENDER: female DOB: 1961-03-18, MRN: [id number]  Chief Complaint  Patient presents with   Lung Mass    HPI  Patient is a pleasant 63 year old female with a history of smoking and asthma presenting for follow up of her pulmonary nodule.  Return Visit 04/16/2024:   She has been in her usual state of health since our last visit and only complains of occasional shortness of breath with exertion.  She does not have a cough nor has she had any worsening in her wheeze.  She is planning a trip to [place]  to visit her son.  She continues to follow with vascular surgery for her left subclavian stenosis where a repeat CT scan is ordered for which she might have to undergo procedural intervention.  She is also followed by cardiology.  She is compliant with her  inhalers but continues to smoke quarter pack a day.   Patient was initially referred to us  after being found to have a pulmonary nodule.  Given history of smoking as well as previous history of PAD (left iliac stent previously placed), she was ordered a stress test which was noted for a left lower lobe nodule.  This was confirmed with a CT scan of the chest performed on 10/31/2023. She underwent robotic assisted navigational bronchoscopy to the LLL pulmonary nodule on 12/18/2023 with cytology returning negative for malignancy. Similarly, EBUS to station 7 and 11L was negative for malignancy.  Return Visit 09/22/2024:  Returns for follow up after repeat chest imaging showed a mild interval increase in the size of her pulmonary nodule. She continues to smoke cigarettes. She continues to have respiratory symptoms, with cough and shortness of breath reported. She does have an occasional wheeze. She has not had any exacerbations.  Patient denies any occupational exposures, reports longstanding history of smoking.  She started smoking at age of 56. She likely has around 50 pack years of smoking history.   Past medical history is significant for PAD, asthma, and subclavian artery stenosis (left).  Ancillary information including prior medications, full medical/surgical/family/social histories, and PFTs (when available) are listed below and have been reviewed.   Review of Systems  Constitutional:  Negative for chills, fever and weight loss.  Respiratory:  Positive for cough and wheezing. Negative for hemoptysis, sputum production and shortness of breath.   Cardiovascular:  Negative for chest pain.     Objective:   Vitals:   09/22/24 0940  BP: (!) 126/92  Pulse: 85  Temp: 97.6 F (36.4 C)  TempSrc: Temporal  SpO2: 95%  Weight: 122 lb 3.2 oz (55.4 kg)  Height: 4' 11.5 (1.511 m)   95% on RA  BMI Readings from Last 3 Encounters:  09/22/24 24.27 kg/m  08/20/24 25.50 kg/m  04/23/24 26.49  kg/m   Wt Readings from Last 3 Encounters:  09/22/24 122 lb 3.2 oz (55.4 kg)  08/20/24 128 lb 6.4 oz (58.2 kg)  04/23/24 133 lb 6.4 oz (60.5 kg)    Physical Exam Constitutional:      Appearance: Normal appearance. She is not ill-appearing.  HENT:     Head: Normocephalic and atraumatic.     Mouth/Throat:     Mouth: Mucous membranes are moist.  Cardiovascular:     Rate and Rhythm: Normal rate and regular rhythm.     Pulses: Normal pulses.     Heart sounds: Normal heart sounds.  Pulmonary:     Effort: Pulmonary effort is normal.     Breath sounds: Wheezing (scattered, faint) present.  Abdominal:     Tenderness: There is no abdominal tenderness.  Neurological:     General: No focal deficit present.     Mental Status: She is alert and oriented to person, place, and time. Mental status is at baseline.       Ancillary Information    Past Medical History:  Diagnosis Date   Anxiety    Asthma    CAD (coronary artery disease)    Chronic fatigue    Chronic thumb pain, bilateral 09/12/2015   COPD (chronic obstructive pulmonary disease) (HCC)    Coronary artery disease    Depression    DJD (degenerative joint disease)    DJD (degenerative joint disease)    Essential hypertension 09/12/2015   Family history of chronic pain 09/26/2015   Fibromyalgia    Fibromyalgia    Fracture five ribs-closed 09/12/2015   H/O acute myocardial infarction 12/14/2014   H/O non-insulin dependent diabetes mellitus 09/12/2015   History of cardiac arrhythmia 09/12/2015   History of elbow surgery 09/26/2015   Left ulnar nerve transposition   History of knee surgery 09/26/2015   History of migraine 09/12/2015   History of seizures 09/12/2015   Hypercholesteremia    MI (myocardial infarction) (HCC)    Migraine    Migraine    Osteoarthritis    PAD (peripheral artery disease)    Right shoulder: Acute Pain due to trauma 04/18/2016   Right shoulder: Chronic Pain 09/12/2015   Seizures (HCC)     Ulcerative (chronic) enterocolitis (HCC)    Vertigo      Family History  Problem Relation Age of Onset   Heart disease Father    Heart disease Brother    Heart disease Maternal Aunt    Breast cancer Maternal Aunt    Heart disease Paternal Aunt    Breast cancer Cousin      Past Surgical History:  Procedure Laterality Date   ABDOMINAL AORTA STENT  2021   ABDOMINAL HYSTERECTOMY     BACK SURGERY  BRONCHIAL BIOPSY  12/18/2023   Procedure: BRONCHIAL BIOPSIES;  Surgeon: Isadora Hose, MD;  Location: [facility];  Service: Pulmonary;;   BRONCHIAL BRUSHINGS  12/18/2023   Procedure: BRONCHIAL BRUSHINGS;  Surgeon: Isadora Hose, MD;  Location: [facility] ENDOSCOPY;  Service: Pulmonary;;   BRONCHIAL NEEDLE ASPIRATION BIOPSY  12/18/2023   Procedure: BRONCHIAL NEEDLE ASPIRATION BIOPSIES;  Surgeon: Isadora Hose, MD;  Location: [facility]OSCOPY;  Service: Pulmonary;;   BRONCHIAL WASHINGS  12/18/2023   Procedure: BRONCHIAL WASHINGS;  Surgeon: Isadora Hose, MD;  Location: [facility] ENDOSCOPY;  Service: Pulmonary;;   KNEE ARTHROPLASTY Left    left elbow surgery     LUMBAR DISC SURGERY     ROTATOR CUFF REPAIR Right    TRIGGER FINGER RELEASE Left    ureter mesh     VIDEO BRONCHOSCOPY WITH ENDOBRONCHIAL ULTRASOUND  12/18/2023   Procedure: VIDEO BRONCHOSCOPY WITH ENDOBRONCHIAL ULTRASOUND;  Surgeon: Isadora Hose, MD;  Location: [facility];  Service: Pulmonary;;    Social History   Socioeconomic History   Marital status: Single    Spouse name: Not on file   Number of children: Not on file   Years of education: Not on file   Highest education level: Not on file  Occupational History   Not on file  Tobacco Use   Smoking status: Every Day    Current packs/day: 0.25    Average packs/day: 0.3 packs/day for 48.8 years (12.2 ttl pk-yrs)    Types: Cigarettes    Start date: 1977   Smokeless tobacco: Never   Tobacco comments:    0.25 PPD- khj 04/16/2024        Started smoking at 63 years old    Smoked 1  PPD at her heaviest  Vaping Use   Vaping status: Never Used  Substance and Sexual Activity   Alcohol use: No   Drug use: No   Sexual activity: Not Currently    Birth control/protection: Surgical  Other Topics Concern   Not on file  Social History Narrative   Not on file   Social Drivers of Health   Financial Resource Strain: Not on file  Food Insecurity: Not on file  Transportation Needs: Not on file  Physical Activity: Not on file  Stress: Not on file  Social Connections: Not on file  Intimate Partner Violence: Not on file     Allergies  Allergen Reactions   Cymbalta [Duloxetine Hcl] Other (See Comments)    dizzy   Duloxetine     Other reaction(s): Dizziness   Flagyl [Metronidazole] Other (See Comments)    Other reaction(s): Other (See Comments) dizzy   Gabapentin Other (See Comments)    Other Reaction(s): dizziness   Pregabalin Other (See Comments)    Other reaction(s): Dizziness  Caused eye damage  It did damage to my eyes  Other Reaction(s): dizziness, Not available   Vicodin [Hydrocodone-Acetaminophen ] Nausea And Vomiting    Other reaction(s): Vomiting   Nsaids Other (See Comments) and Rash    bleeding   Silicone Rash   Tape Rash     CBC    Component Value Date/Time   WBC 7.9 12/18/2023 0716   RBC 4.16 12/18/2023 0716   HGB 14.4 12/18/2023 0716   HGB 15.1 04/18/2023 1114   HCT 42.4 12/18/2023 0716   HCT 44.1 04/18/2023 1114   PLT 252 12/18/2023 0716   PLT 356 10/26/2013 1938   MCV 101.9 (H) 12/18/2023 0716   MCV 100 (H) 04/18/2023 1114   MCV 102 (H) 10/26/2013 1938  MCH 34.6 (H) 12/18/2023 0716   MCHC 34.0 12/18/2023 0716   RDW 14.4 12/18/2023 0716   RDW 12.6 04/18/2023 1114   RDW 14.1 10/26/2013 1938   LYMPHSABS 2.7 04/18/2023 1114   LYMPHSABS 4.8 (H) 10/26/2013 1938   MONOABS 0.6 09/11/2015 0254   MONOABS 0.7 10/26/2013 1938   EOSABS 0.2 04/18/2023 1114   EOSABS 0.0 10/26/2013 1938   BASOSABS 0.1 04/18/2023 1114   BASOSABS 0.1  10/26/2013 1938    Pulmonary Functions Testing Results:    Latest Ref Rng & Units 11/28/2023   11:02 AM  PFT Results  FVC-Pre L 2.07   FVC-Predicted Pre % 75   FVC-Post L 2.09   FVC-Predicted Post % 75   Pre FEV1/FVC % % 86   Post FEV1/FCV % % 79   FEV1-Pre L 1.78   FEV1-Predicted Pre % 84   FEV1-Post L 1.66   DLCO uncorrected ml/min/mmHg 11.67   DLCO UNC% % 67   DLVA Predicted % 76   TLC L 4.14   TLC % Predicted % 94   RV % Predicted % 97     Outpatient Medications Prior to Visit  Medication Sig Dispense Refill   ADVAIR DISKUS 500-50 MCG/DOSE AEPB      albuterol  (PROVENTIL ) (2.5 MG/3ML) 0.083% nebulizer solution as needed.     albuterol -ipratropium (COMBIVENT) 18-103 MCG/ACT inhaler Inhale 2 puffs into the lungs every 6 (six) hours as needed for wheezing or shortness of breath.     aspirin EC 81 MG tablet Take 81 mg by mouth daily.     benzonatate (TESSALON) 200 MG capsule Take 200 mg by mouth 3 (three) times daily as needed for cough.     bisoprolol-hydrochlorothiazide (ZIAC) 5-6.25 MG tablet Take 0.5 tablets by mouth daily.     butalbital-acetaminophen -caffeine (FIORICET, ESGIC) 50-325-40 MG tablet Take 1 tablet by mouth 2 (two) times daily.     Calcium  Carbonate-Vit D-Min (CALCIUM  1200 PO) Take by mouth daily.     Cholecalciferol (VITAMIN D ) 2000 UNITS tablet Take 1 tablet (2,000 Units total) by mouth daily. (Patient taking differently: Take 4,000 Units by mouth daily.) 30 tablet PRN   cyanocobalamin (,VITAMIN B-12,) 1000 MCG/ML injection every 30 (thirty) days.     dicyclomine (BENTYL) 10 MG capsule take 1 capsule by mouth four times a day if needed for ABDOMINAL SPASM  0   estradiol (CLIMARA - DOSED IN MG/24 HR) 0.1 mg/24hr patch Place 0.1 mg onto the skin once a week. On Wednesday (Patient taking differently: Place 0.5 mg onto the skin once a week. On Wednesday)     ezetimibe  (ZETIA ) 10 MG tablet Take 1 tablet (10 mg total) by mouth daily. 90 tablet 3    hydrochlorothiazide (HYDRODIURIL) 12.5 MG tablet  (Patient taking differently: PRN)     HYDROcodone-acetaminophen  (NORCO) 10-325 MG tablet Take 1 tablet by mouth 3 (three) times daily as needed.     MAGNESIUM-POTASSIUM PO Take 1 tablet by mouth daily in the afternoon.     meclizine (ANTIVERT) 25 MG tablet Take 25 mg by mouth 3 (three) times daily as needed.     nitroGLYCERIN (NITROSTAT) 0.4 MG SL tablet Place 0.4 mg under the tongue every 5 (five) minutes as needed for chest pain.     PARoxetine (PAXIL) 30 MG tablet Take 30 mg by mouth daily.     rosuvastatin  (CRESTOR ) 20 MG tablet Take 20 mg by mouth at bedtime.     vitamin E 1000 UNIT capsule Take 1,000 Units by  mouth daily.     azelastine (OPTIVAR) 0.05 % ophthalmic solution Place 1 drop into both eyes daily.     COMBIVENT RESPIMAT 20-100 MCG/ACT AERS respimat Inhale 1 puff into the lungs 2 (two) times daily. (Patient not taking: Reported on 09/22/2024)  1   Fluticasone-Salmeterol (ADVAIR) 100-50 MCG/DOSE AEPB Inhale 1 puff into the lungs 2 (two) times daily. (Patient not taking: Reported on 09/22/2024)     promethazine (PHENERGAN) 25 MG tablet Take 25 mg by mouth every 6 (six) hours as needed for nausea or vomiting.     No facility-administered medications prior to visit.

## 2024-09-24 NOTE — Telephone Encounter (Signed)
 For the codes given to me 68372, 31623, 31624, 31629, [id number], [id number], [phone] Prior Auth Not Required Refer # [id number]

## 2024-09-27 ENCOUNTER — Ambulatory Visit
Admission: RE | Admit: 2024-09-27 | Discharge: 2024-09-27 | Disposition: A | Discharge status: Home / Self Care | Source: Ambulatory Visit | Attending: Student in an Organized Health Care Education/Training Program | Admitting: Student in an Organized Health Care Education/Training Program

## 2024-09-27 ENCOUNTER — Ambulatory Visit: Admitting: Student in an Organized Health Care Education/Training Program

## 2024-09-27 DIAGNOSIS — R911 Solitary pulmonary nodule: Secondary | ICD-10-CM

## 2024-09-28 ENCOUNTER — Ambulatory Visit
Admission: RE | Admit: 2024-09-28 | Discharge: 2024-09-28 | Disposition: A | Discharge status: Home / Self Care | Source: Ambulatory Visit | Attending: Student in an Organized Health Care Education/Training Program | Admitting: Student in an Organized Health Care Education/Training Program

## 2024-09-28 DIAGNOSIS — I700 Atherosclerosis of aorta: Secondary | ICD-10-CM | POA: Insufficient documentation

## 2024-09-28 DIAGNOSIS — I6523 Occlusion and stenosis of bilateral carotid arteries: Secondary | ICD-10-CM | POA: Diagnosis not present

## 2024-09-28 DIAGNOSIS — R911 Solitary pulmonary nodule: Secondary | ICD-10-CM | POA: Insufficient documentation

## 2024-09-28 DIAGNOSIS — I2510 Atherosclerotic heart disease of native coronary artery without angina pectoris: Secondary | ICD-10-CM | POA: Diagnosis not present

## 2024-09-28 DIAGNOSIS — J439 Emphysema, unspecified: Secondary | ICD-10-CM | POA: Diagnosis not present

## 2024-09-28 DIAGNOSIS — M898X5 Other specified disorders of bone, thigh: Secondary | ICD-10-CM | POA: Diagnosis not present

## 2024-09-28 LAB — GLUCOSE, CAPILLARY: Glucose-Capillary: 107 mg/dL — ABNORMAL HIGH (ref 70–99)

## 2024-09-28 MED ORDER — FLUDEOXYGLUCOSE F - 18 (FDG) INJECTION
6.9100 | Freq: Once | INTRAVENOUS | Status: AC | PRN
  Administered 2024-09-28: 6.91 via INTRAVENOUS

## 2024-09-29 ENCOUNTER — Encounter
Admission: RE | Admit: 2024-09-29 | Discharge: 2024-09-29 | Disposition: A | Discharge status: Home / Self Care | Source: Ambulatory Visit | Attending: Student in an Organized Health Care Education/Training Program | Admitting: Student in an Organized Health Care Education/Training Program

## 2024-09-29 ENCOUNTER — Other Ambulatory Visit: Payer: Self-pay

## 2024-09-29 DIAGNOSIS — R911 Solitary pulmonary nodule: Secondary | ICD-10-CM

## 2024-09-29 DIAGNOSIS — I10 Essential (primary) hypertension: Secondary | ICD-10-CM

## 2024-09-29 HISTORY — DX: Atherosclerosis of aorta: I70.0

## 2024-09-29 HISTORY — DX: Emphysema, unspecified: J43.9

## 2024-09-29 HISTORY — DX: Dyspnea, unspecified: R06.00

## 2024-09-29 HISTORY — DX: Pneumonia, unspecified organism: J18.9

## 2024-09-29 HISTORY — DX: Solitary pulmonary nodule: R91.1

## 2024-09-29 NOTE — Patient Instructions (Addendum)
 Your procedure is scheduled on: 10/05/24 - Tuesday Report to the Registration Desk on the 1st floor of the [facility]. To find out your arrival time, please call [phone] between 1PM - 3PM on: 10/04/24 -Monday If your arrival time is 6:00 am, do not arrive before that time as the [facility] entrance doors do not open until 6:00 am.  REMEMBER: Instructions that are not followed completely may result in serious medical risk, up to and including death; or upon the discretion of your surgeon and anesthesiologist your surgery may need to be rescheduled.  Do not eat food or drink any liquids after midnight the night before surgery.  No gum chewing or hard candies.  One week prior to surgery: Stop Anti-inflammatories (NSAIDS) such as Advil, Aleve, Ibuprofen, Motrin, Naproxen, Naprosyn and Aspirin based products such as Excedrin, Goody's Powder, BC Powder. You may take Tylenol  if needed for pain up until the day of surgery.   Stop ANY OVER THE COUNTER supplements until after surgery.  ON THE DAY OF SURGERY ONLY TAKE THESE MEDICATIONS WITH SIPS OF WATER:  PARoxetine (PAXIL)  FIORICET HYDROcodone-acetaminophen  if needed diazepam (VALIUM) if needed  Use inhalers albuterol -ipratropium  on the day of surgery and bring to the hospital.   No Alcohol for 24 hours before or after surgery.  No Smoking including e-cigarettes for 24 hours before surgery.  No chewable tobacco products for at least 6 hours before surgery.  No nicotine patches on the day of surgery.  Do not use any recreational drugs for at least a week (preferably 2 weeks) before your surgery.  Please be advised that the combination of cocaine and anesthesia may have negative outcomes, up to and including death. If you test positive for cocaine, your surgery will be cancelled.  On the morning of surgery brush your teeth with toothpaste and water, you may rinse your mouth with mouthwash if you wish. Do not swallow any  toothpaste or mouthwash.  Do not wear jewelry, make-up, hairpins, clips or nail polish.  For welded (permanent) jewelry: bracelets, anklets, waist bands, etc.  Please have this removed prior to surgery.  If it is not removed, there is a chance that hospital personnel will need to cut it off on the day of surgery.  Do not wear lotions, powders, or perfumes.   Do not shave body hair from the neck down 48 hours before surgery.  Contact lenses, hearing aids and dentures may not be worn into surgery.  Do not bring valuables to the hospital. [facility] is not responsible for any missing/lost belongings or valuables.   Notify your doctor if there is any change in your medical condition (cold, fever, infection).  Wear comfortable clothing (specific to your surgery type) to the hospital.  After surgery, you can help prevent lung complications by doing breathing exercises.  Take deep breaths and cough every 1-2 hours. Your doctor may order a device called an Incentive Spirometer to help you take deep breaths.  If you are being admitted to the hospital overnight, leave your suitcase in the car. After surgery it may be brought to your room.  In case of increased patient census, it may be necessary for you, the patient, to continue your postoperative care in the Same Day Surgery department.  If you are being discharged the day of surgery, you will not be allowed to drive home. You will need a responsible individual to drive you home and stay with you for 24 hours after surgery.  If you are taking public transportation, you will need to have a responsible individual with you.  Please call the Pre-admissions Testing Dept. at [phone] if you have any questions about these instructions.  Surgery Visitation Policy:  Patients having surgery or a procedure may have two visitors.  Children under the age of 50 must have an adult with them who is not the patient.  Inpatient Visitation:     Visiting hours are 7 a.m. to 8 p.m. Up to four visitors are allowed at one time in a patient room. The visitors may rotate out with other people during the day.  One visitor age 65 or older may stay with the patient overnight and must be in the room by 8 p.m.   Merchandiser, Retail to address health-related social needs:  [email or web address]

## 2024-10-01 ENCOUNTER — Encounter
Admission: RE | Admit: 2024-10-01 | Discharge: 2024-10-01 | Disposition: A | Discharge status: Home / Self Care | Source: Ambulatory Visit | Attending: Student in an Organized Health Care Education/Training Program | Admitting: Student in an Organized Health Care Education/Training Program

## 2024-10-01 DIAGNOSIS — Z01812 Encounter for preprocedural laboratory examination: Secondary | ICD-10-CM | POA: Diagnosis present

## 2024-10-01 DIAGNOSIS — I10 Essential (primary) hypertension: Secondary | ICD-10-CM | POA: Diagnosis not present

## 2024-10-01 LAB — BASIC METABOLIC PANEL WITH GFR
Anion gap: 9 (ref 5–15)
BUN: 19 mg/dL (ref 8–23)
CO2: 25 mmol/L (ref 22–32)
Calcium: 8.4 mg/dL — ABNORMAL LOW (ref 8.9–10.3)
Chloride: 106 mmol/L (ref 98–111)
Creatinine, Ser: 0.9 mg/dL (ref 0.44–1.00)
GFR, Estimated: >60 mL/min (ref >60)
Glucose, Bld: 86 mg/dL (ref 70–99)
Potassium: 4.4 mmol/L (ref 3.5–5.1)
Sodium: 140 mmol/L (ref 135–145)

## 2024-10-01 LAB — CBC
HCT: 43 % (ref 36.0–46.0)
Hemoglobin: 14.6 g/dL (ref 12.0–15.0)
MCH: 33.7 pg (ref 26.0–34.0)
MCHC: 34 g/dL (ref 30.0–36.0)
MCV: 99.3 fL (ref 80.0–100.0)
Platelets: 250 K/uL (ref 150–400)
RBC: 4.33 MIL/uL (ref 3.87–5.11)
RDW: 13.3 % (ref 11.5–15.5)
WBC: 6.5 K/uL (ref 4.0–10.5)
nRBC: 0 % (ref 0.0–0.2)

## 2024-10-04 MED ORDER — CHLORHEXIDINE GLUCONATE 0.12 % MT SOLN
15.0000 mL | Freq: Once | OROMUCOSAL | Status: AC
  Administered 2024-10-05: 15 mL via OROMUCOSAL

## 2024-10-04 MED ORDER — LACTATED RINGERS IV SOLN: INTRAVENOUS | Status: DC

## 2024-10-04 MED ORDER — ORAL CARE MOUTH RINSE: 15.0000 mL | Freq: Once | OROMUCOSAL | Status: AC

## 2024-10-05 ENCOUNTER — Encounter

## 2024-10-05 ENCOUNTER — Ambulatory Visit
Admission: RE | Admit: 2024-10-05 | Discharge: 2024-10-05 | Disposition: A | Discharge status: Home / Self Care | Attending: Student in an Organized Health Care Education/Training Program | Admitting: Student in an Organized Health Care Education/Training Program

## 2024-10-05 ENCOUNTER — Ambulatory Visit

## 2024-10-05 ENCOUNTER — Encounter
Admission: RE | Disposition: A | Discharge status: Home / Self Care | Payer: Self-pay | Source: Home / Self Care | Attending: Student in an Organized Health Care Education/Training Program

## 2024-10-05 ENCOUNTER — Encounter: Payer: Self-pay | Admitting: Student in an Organized Health Care Education/Training Program

## 2024-10-05 ENCOUNTER — Other Ambulatory Visit: Payer: Self-pay

## 2024-10-05 DIAGNOSIS — J4520 Mild intermittent asthma, uncomplicated: Secondary | ICD-10-CM | POA: Insufficient documentation

## 2024-10-05 DIAGNOSIS — Z8711 Personal history of peptic ulcer disease: Secondary | ICD-10-CM | POA: Insufficient documentation

## 2024-10-05 DIAGNOSIS — F17210 Nicotine dependence, cigarettes, uncomplicated: Secondary | ICD-10-CM | POA: Insufficient documentation

## 2024-10-05 DIAGNOSIS — I2510 Atherosclerotic heart disease of native coronary artery without angina pectoris: Secondary | ICD-10-CM | POA: Insufficient documentation

## 2024-10-05 DIAGNOSIS — F418 Other specified anxiety disorders: Secondary | ICD-10-CM | POA: Insufficient documentation

## 2024-10-05 DIAGNOSIS — I10 Essential (primary) hypertension: Secondary | ICD-10-CM | POA: Insufficient documentation

## 2024-10-05 DIAGNOSIS — I739 Peripheral vascular disease, unspecified: Secondary | ICD-10-CM | POA: Insufficient documentation

## 2024-10-05 DIAGNOSIS — J4489 Other specified chronic obstructive pulmonary disease: Secondary | ICD-10-CM | POA: Diagnosis not present

## 2024-10-05 DIAGNOSIS — I252 Old myocardial infarction: Secondary | ICD-10-CM | POA: Diagnosis not present

## 2024-10-05 DIAGNOSIS — R911 Solitary pulmonary nodule: Secondary | ICD-10-CM | POA: Insufficient documentation

## 2024-10-05 HISTORY — PX: ENDOBRONCHIAL ULTRASOUND: SHX5096

## 2024-10-05 HISTORY — PX: VIDEO BRONCHOSCOPY WITH ENDOBRONCHIAL NAVIGATION: SHX6175

## 2024-10-05 SURGERY — VIDEO BRONCHOSCOPY WITH ENDOBRONCHIAL NAVIGATION
Anesthesia: General | Laterality: Bilateral

## 2024-10-05 MED ORDER — PROPOFOL 10 MG/ML IV BOLUS
INTRAVENOUS | Status: DC | PRN
  Administered 2024-10-05: 120 mg via INTRAVENOUS
  Administered 2024-10-05: 120 ug/kg/min via INTRAVENOUS

## 2024-10-05 MED ORDER — CHLORHEXIDINE GLUCONATE 0.12 % MT SOLN
OROMUCOSAL | Status: AC
  Filled 2024-10-05: qty 15

## 2024-10-05 MED ORDER — DROPERIDOL 2.5 MG/ML IJ SOLN
0.6250 mg | Freq: Once | INTRAMUSCULAR | Status: AC
  Administered 2024-10-05: 0.625 mg via INTRAVENOUS

## 2024-10-05 MED ORDER — DEXAMETHASONE SOD PHOSPHATE PF 10 MG/ML IJ SOLN
INTRAMUSCULAR | Status: DC | PRN
  Administered 2024-10-05: 10 mg via INTRAVENOUS

## 2024-10-05 MED ORDER — FENTANYL CITRATE (PF) 100 MCG/2ML IJ SOLN
INTRAMUSCULAR | Status: DC | PRN
  Administered 2024-10-05: 50 ug via INTRAVENOUS

## 2024-10-05 MED ORDER — LIDOCAINE HCL (PF) 2 % IJ SOLN
INTRAMUSCULAR | Status: AC
  Filled 2024-10-05: qty 5

## 2024-10-05 MED ORDER — SEVOFLURANE IN SOLN
RESPIRATORY_TRACT | Status: AC
  Filled 2024-10-05: qty 250

## 2024-10-05 MED ORDER — MIDAZOLAM HCL 2 MG/2ML IJ SOLN
INTRAMUSCULAR | Status: AC
  Filled 2024-10-05: qty 2

## 2024-10-05 MED ORDER — ROCURONIUM BROMIDE 100 MG/10ML IV SOLN
INTRAVENOUS | Status: DC | PRN
  Administered 2024-10-05: 20 mg via INTRAVENOUS
  Administered 2024-10-05: 50 mg via INTRAVENOUS

## 2024-10-05 MED ORDER — MIDAZOLAM HCL (PF) 2 MG/2ML IJ SOLN
INTRAMUSCULAR | Status: DC | PRN
  Administered 2024-10-05: 2 mg via INTRAVENOUS

## 2024-10-05 MED ORDER — OXYCODONE HCL 5 MG/5ML PO SOLN: 5.0000 mg | Freq: Once | ORAL | Status: DC | PRN

## 2024-10-05 MED ORDER — DROPERIDOL 2.5 MG/ML IJ SOLN
INTRAMUSCULAR | Status: AC
  Filled 2024-10-05: qty 2

## 2024-10-05 MED ORDER — TRANEXAMIC ACID FOR INHALATION
500.0000 mg | Freq: Once | RESPIRATORY_TRACT | Status: AC
  Administered 2024-10-05: 500 mg via RESPIRATORY_TRACT
  Filled 2024-10-05: qty 5
  Filled 2024-10-05: qty 10

## 2024-10-05 MED ORDER — PROPOFOL 1000 MG/100ML IV EMUL
INTRAVENOUS | Status: AC
  Filled 2024-10-05: qty 100

## 2024-10-05 MED ORDER — FENTANYL CITRATE (PF) 100 MCG/2ML IJ SOLN
INTRAMUSCULAR | Status: AC
  Filled 2024-10-05: qty 2

## 2024-10-05 MED ORDER — SUGAMMADEX SODIUM 200 MG/2ML IV SOLN
INTRAVENOUS | Status: DC | PRN
  Administered 2024-10-05: 100 mg via INTRAVENOUS

## 2024-10-05 MED ORDER — PROPOFOL 10 MG/ML IV BOLUS
INTRAVENOUS | Status: AC
  Filled 2024-10-05: qty 20

## 2024-10-05 MED ORDER — OXYCODONE HCL 5 MG PO TABS: 5.0000 mg | ORAL_TABLET | Freq: Once | ORAL | Status: DC | PRN

## 2024-10-05 MED ORDER — ONDANSETRON HCL 4 MG/2ML IJ SOLN
INTRAMUSCULAR | Status: DC | PRN
  Administered 2024-10-05: 4 mg via INTRAVENOUS

## 2024-10-05 MED ORDER — ONDANSETRON HCL 4 MG/2ML IJ SOLN
INTRAMUSCULAR | Status: AC
  Filled 2024-10-05: qty 2

## 2024-10-05 MED ORDER — FENTANYL CITRATE (PF) 100 MCG/2ML IJ SOLN: 25.0000 ug | INTRAMUSCULAR | Status: DC | PRN

## 2024-10-05 MED ORDER — LIDOCAINE HCL (CARDIAC) PF 100 MG/5ML IV SOSY
PREFILLED_SYRINGE | INTRAVENOUS | Status: DC | PRN
  Administered 2024-10-05: 100 mg via INTRAVENOUS

## 2024-10-05 MED ORDER — ROCURONIUM BROMIDE 10 MG/ML (PF) SYRINGE
PREFILLED_SYRINGE | INTRAVENOUS | Status: AC
  Filled 2024-10-05: qty 10

## 2024-10-05 NOTE — Transfer of Care (Signed)
 Immediate Anesthesia Transfer of Care Note  Patient: Michelle Newton  Procedure(s) Performed: VIDEO BRONCHOSCOPY WITH ENDOBRONCHIAL NAVIGATION (Bilateral) ENDOBRONCHIAL ULTRASOUND (EBUS) (Bilateral)  Patient Location: PACU  Anesthesia Type:General  Level of Consciousness: awake  Airway & Oxygen Therapy: Patient Spontanous Breathing  Post-op Assessment: Report given to RN and Post -op Vital signs reviewed and stable  Post vital signs: Reviewed and stable  Last Vitals:  Vitals Value Taken Time  BP 124/88 10/05/24 09:15  Temp 36.1 C 10/05/24 09:15  Pulse 77 10/05/24 09:19  Resp 14 10/05/24 09:19  SpO2 92 % 10/05/24 09:19  Vitals shown include unfiled device data.  Last Pain:  Vitals:   10/05/24 0657  TempSrc: Temporal  PainSc: 9          Complications: There were no known notable events for this encounter.

## 2024-10-05 NOTE — Procedures (Signed)
 Procedure Note  Patient: Michelle Newton  GE 3D [facility] - [place] mobile C-arm was utilized to identify and biopsy LLL nodule.  Needle-in-lesion was confirmed using real-time GE 3D OEC imaging, and images were uploaded to PACS.     [name], MD [facility] 10/05/2024 9:00 AM

## 2024-10-05 NOTE — Op Note (Signed)
 Video Bronchoscopy with Robotic Assisted Bronchoscopic Navigation   Date of Operation: 10/05/2024   Pre-op Diagnosis: Lung nodule  Surgeon: [name], MD  Anesthesia: General endotracheal anesthesia  Operation: Flexible video fiberoptic bronchoscopy with robotic assistance and biopsies.  Estimated Blood Loss: Minimal  Complications: None  Indications and History: Michelle Newton is a 63 y.o. female with history of smoking, COPD, and a LLL nodule previously biopsied with mild growth on interval repeat CT.  Recommendation made to achieve a tissue diagnosis via robotic assisted navigational bronchoscopy.  The risks, benefits, complications, treatment options and expected outcomes were discussed with the patient.  The possibilities of pneumothorax, pneumonia, reaction to medication, pulmonary aspiration, perforation of a viscus, bleeding, failure to diagnose a condition and creating a complication requiring transfusion or operation were discussed with the patient who freely signed the consent.    Description of Procedure: The patient was seen in the Preoperative Area, was examined and was deemed appropriate to proceed.  The patient was taken to [facility] procedure room, identified as Michelle Newton and the procedure verified as Flexible Video Fiberoptic Bronchoscopy.  A Time Out was held and the above information confirmed.   Prior to the date of the procedure a high-resolution CT scan of the chest was performed. Utilizing ION software program a virtual tracheobronchial tree was generated to allow the creation of distinct navigation pathways to the patient's parenchymal abnormalities. After being taken to the operating room general anesthesia was initiated and the patient  was orally intubated. The video fiberoptic bronchoscope was introduced via the endotracheal tube and a general inspection was performed which showed normal right and left lung anatomy. Aspiration of the bilateral mainstems was  completed to remove any remaining secretions. Robotic catheter inserted into patient's endotracheal tube.   Target #1 LLL nodule: The distinct navigation pathways prepared prior to this procedure were then utilized to navigate to patient's lesion identified on CT scan. The robotic catheter was secured into place and the vision probe was withdrawn.  Lesion location was approximated using fluoroscopy.  Local registration and targeting was performed using GE 3D OEC mobile C-arm three-dimensional imaging. Under fluoroscopic guidance transbronchial brushings, transbronchial needle biopsies, and transbronchial forceps biopsies were performed to be sent for cytology and pathology.  Needle-in-lesion was confirmed using GE mobile C-arm.  A bronchioalveolar lavage was performed in the LLL and sent for cytology.  Tool in lesion:     At the end of the procedure a general airway inspection was performed and there was evidence of active bleeding originating from the LLL. Blood and secretions were suctioned, and bleeding was controlled with hypertonic saline as well as instillation of epinephrine  topically in the airway. A Fogarty balloon was used to tamponade the airway. Following this, the bleeding subsided and there was no active bleeding from the airway. The bronchoscope was removed.  The patient tolerated the procedure well. There was no significant blood loss. A post-procedural chest x-ray is pending.  Samples Target #1: LLL nodule 1. Transbronchial needle brushings from LLL nodule 2. Transbronchial Wang needle biopsies from LLL nodule 3. Transbronchial forceps biopsies from LLL nodule 4. Bronchoalveolar lavage from LLL  Plans:  The patient will be discharged from the PACU to home when recovered from anesthesia and after chest x-ray is reviewed. We will review the cytology, pathology and microbiology results with the patient when they become available. Outpatient followup will be with myself.   [name], MD [facility] 10/05/2024 8:57 AM

## 2024-10-05 NOTE — Anesthesia Postprocedure Evaluation (Signed)
 Anesthesia Post Note  Patient: Michelle Newton  Procedure(s) Performed: VIDEO BRONCHOSCOPY WITH ENDOBRONCHIAL NAVIGATION (Bilateral) ENDOBRONCHIAL ULTRASOUND (EBUS) (Bilateral)  Patient location during evaluation: PACU Anesthesia Type: General Level of consciousness: awake and alert Pain management: pain level controlled Vital Signs Assessment: post-procedure vital signs reviewed and stable Respiratory status: spontaneous breathing, nonlabored ventilation, respiratory function stable and patient connected to nasal cannula oxygen Cardiovascular status: blood pressure returned to baseline and stable Postop Assessment: no apparent nausea or vomiting Anesthetic complications: no   There were no known notable events for this encounter.   Last Vitals:  Vitals:   10/05/24 1000 10/05/24 1021  BP: 132/80 136/77  Pulse: 66 66  Resp: 15 16  Temp:  (!) 36.1 C  SpO2: 94% 96%    Last Pain:  Vitals:   10/05/24 1021  TempSrc: Temporal  PainSc: 0-No pain                 Debby Mines

## 2024-10-05 NOTE — Anesthesia Preprocedure Evaluation (Signed)
 Anesthesia Evaluation  Patient identified by MRN, date of birth, ID band Patient awake    Reviewed: Allergy & Precautions, H&P , NPO status , Patient's Chart, lab work & pertinent test results  Airway Mallampati: II  TM Distance: >3 FB Neck ROM: Full    Dental no notable dental hx. (+) Chipped   Pulmonary neg pulmonary ROS, asthma , COPD,  COPD inhaler, Current Smoker and Patient abstained from smoking.  lung nodule   Pulmonary exam normal breath sounds clear to auscultation       Cardiovascular hypertension, + CAD, + Past MI and + Peripheral Vascular Disease  negative cardio ROS Normal cardiovascular exam Rhythm:Regular Rate:Normal     Neuro/Psych  Headaches, Seizures -,  PSYCHIATRIC DISORDERS Anxiety Depression    negative neurological ROS  negative psych ROS   GI/Hepatic negative GI ROS, Neg liver ROS, PUD,,,  Endo/Other  negative endocrine ROS    Renal/GU      Musculoskeletal   Abdominal   Peds  Hematology negative hematology ROS (+)   Anesthesia Other Findings Past Medical History: No date: Anxiety No date: Aortic atherosclerosis No date: Asthma No date: CAD (coronary artery disease) No date: Chronic fatigue 09/12/2015: Chronic thumb pain, bilateral No date: COPD (chronic obstructive pulmonary disease) (HCC) No date: Coronary artery disease No date: Depression No date: DJD (degenerative joint disease) No date: DJD (degenerative joint disease) No date: Dyspnea No date: Emphysema lung (HCC) 09/12/2015: Essential hypertension 09/26/2015: Family history of chronic pain No date: Fibromyalgia No date: Fibromyalgia 09/12/2015: Fracture five ribs-closed 12/14/2014: H/O acute myocardial infarction 09/12/2015: H/O non-insulin dependent diabetes mellitus 09/12/2015: History of cardiac arrhythmia 09/26/2015: History of elbow surgery     Comment:  Left ulnar nerve transposition 09/26/2015: History of knee  surgery 09/12/2015: History of migraine 09/12/2015: History of seizures No date: Hypercholesteremia No date: Lung nodule No date: MI (myocardial infarction) (HCC) No date: Migraine No date: Migraine No date: Osteoarthritis No date: PAD (peripheral artery disease) No date: Pneumonia 04/18/2016: Right shoulder: Acute Pain due to trauma 09/12/2015: Right shoulder: Chronic Pain No date: Seizures (HCC) No date: Ulcerative (chronic) enterocolitis (HCC) No date: Vertigo  Past Surgical History: 2021: ABDOMINAL AORTA STENT No date: ABDOMINAL HYSTERECTOMY No date: BACK SURGERY 12/18/2023: BRONCHIAL BIOPSY     Comment:  Procedure: BRONCHIAL BIOPSIES;  Surgeon: Isadora Hose,              MD;  Location: [facility] END[facility];  Service: Pulmonary;; 12/18/2023: BRONCHIAL BRUSHINGS     Comment:  Procedure: BRONCHIAL BRUSHINGS;  Surgeon: Isadora Hose, MD;  Location: [facility] ENDOSCOPY;  Service:               Pulmonary;; 12/18/2023: BRONCHIAL NEEDLE ASPIRATION BIOPSY     Comment:  Procedure: BRONCHIAL NEEDLE ASPIRATION BIOPSIES;                Surgeon: Isadora Hose, MD;  Location: [facility] ENDOS[facility];                Service: Pulmonary;; 12/18/2023: BRONCHIAL WASHINGS     Comment:  Procedure: BRONCHIAL WASHINGS;  Surgeon: Isadora Hose,              MD;  Location: [facility] ENDOS[facility];  Service: Pulmonary;; No date: FRACTURE SURGERY; Left     Comment:  x 2 surgeries with pins and screws No date: left elbow surgery No date: Left Jaw fractured and repaired     Comment:  pin placed - lower left No date: left knee arthoscopic procedure No date: LUMBAR DISC SURGERY No date: Right Thumb - pin placed No date: ROTATOR CUFF REPAIR; Right No date: TRIGGER FINGER RELEASE; Left No date: ureter mesh 12/18/2023: VIDEO BRONCHOSCOPY WITH ENDOBRONCHIAL ULTRASOUND     Comment:  Procedure: VIDEO BRONCHOSCOPY WITH ENDOBRONCHIAL               ULTRASOUND;  Surgeon: Isadora Hose, MD;  Location: [facility]                ENDOSCOPY;  Service: Pulmonary;;     Reproductive/Obstetrics negative OB ROS                              Anesthesia Physical Anesthesia Plan  ASA: 3  Anesthesia Plan: General ETT   Post-op Pain Management:    Induction: Intravenous  PONV Risk Score and Plan: 3 and Ondansetron , Dexamethasone  and Midazolam   Airway Management Planned: Oral ETT  Additional Equipment:   Intra-op Plan:   Post-operative Plan: Extubation in OR  Informed Consent: I have reviewed the patients History and Physical, chart, labs and discussed the procedure including the risks, benefits and alternatives for the proposed anesthesia with the patient or authorized representative who has indicated his/her understanding and acceptance.     Dental Advisory Given  Plan Discussed with: Anesthesiologist, CRNA and Surgeon  Anesthesia Plan Comments: (Patient consented for risks of anesthesia including but not limited to:  - adverse reactions to medications - damage to eyes, teeth, lips or other oral mucosa - nerve damage due to positioning  - sore throat or hoarseness - Damage to heart, brain, nerves, lungs, other parts of body or loss of life  Patient voiced understanding and assent.)        Anesthesia Quick Evaluation

## 2024-10-05 NOTE — Anesthesia Procedure Notes (Addendum)
 Procedure Name: Intubation Date/Time: 10/05/2024 7:53 AM  Performed by: Bonnetta Jimmey SAUNDERS, CRNAPre-anesthesia Checklist: Patient identified, Emergency Drugs available, Suction available, Patient being monitored and Timeout performed Patient Re-evaluated:Patient Re-evaluated prior to induction Oxygen Delivery Method: Circle system utilized Preoxygenation: Pre-oxygenation with 100% oxygen Induction Type: IV induction Ventilation: Mask ventilation without difficulty Laryngoscope Size: McGrath and 3 Grade View: Grade I Tube size: 8.5 mm Number of attempts: 1 Airway Equipment and Method: Stylet and Video-laryngoscopy Placement Confirmation: ETT inserted through vocal cords under direct vision, positive ETCO2 and breath sounds checked- equal and bilateral Secured at: 20 cm Tube secured with: Tape Dental Injury: Teeth and Oropharynx as per pre-operative assessment

## 2024-10-05 NOTE — Interval H&P Note (Signed)
 Patient presenting for robotic assisted navigational bronchoscopy for biopsy of a LLL pulmonary nodule that was mildly FDG avid on PET/CT with mild interval growth on repeat CT. Risks and benefits were explained, and the patient agrees to proceed with the procedure. She is appropriate for the procedure. All the questions were answered.  [name], MD [facility] 10/05/2024 7:37 AM

## 2024-10-06 ENCOUNTER — Encounter: Payer: Self-pay | Admitting: Student in an Organized Health Care Education/Training Program

## 2024-10-07 ENCOUNTER — Ambulatory Visit
Admission: RE | Admit: 2024-10-07 | Discharge: 2024-10-07 | Disposition: A | Discharge status: Home / Self Care | Source: Ambulatory Visit | Attending: Family Medicine | Admitting: Family Medicine

## 2024-10-07 ENCOUNTER — Other Ambulatory Visit: Payer: Self-pay | Admitting: Family Medicine

## 2024-10-07 ENCOUNTER — Ambulatory Visit
Admission: RE | Admit: 2024-10-07 | Discharge: 2024-10-07 | Disposition: A | Discharge status: Home / Self Care | Attending: Family Medicine | Admitting: Family Medicine

## 2024-10-07 DIAGNOSIS — R062 Wheezing: Secondary | ICD-10-CM

## 2024-10-07 DIAGNOSIS — R0602 Shortness of breath: Secondary | ICD-10-CM

## 2024-10-07 LAB — SURGICAL PATHOLOGY

## 2024-10-07 LAB — CYTOLOGY - NON PAP

## 2024-10-11 ENCOUNTER — Telehealth (INDEPENDENT_AMBULATORY_CARE_PROVIDER_SITE_OTHER)

## 2024-10-11 ENCOUNTER — Encounter (HOSPITAL_BASED_OUTPATIENT_CLINIC_OR_DEPARTMENT_OTHER): Payer: Self-pay

## 2024-10-11 DIAGNOSIS — F17210 Nicotine dependence, cigarettes, uncomplicated: Secondary | ICD-10-CM | POA: Diagnosis not present

## 2024-10-11 DIAGNOSIS — R911 Solitary pulmonary nodule: Secondary | ICD-10-CM | POA: Diagnosis not present

## 2024-10-11 NOTE — Progress Notes (Signed)
 Patient ID: Michelle Newton, female     DOB: 04-Jul-1961, 63 y.o.      MRN: [id number]  No chief complaint on file.   Virtual Visit via Video Note  I connected with Michelle Newton on 10/11/24 at  9:30 AM EST by a video enabled telemedicine application and verified that I am speaking with the correct person using two identifiers.  Location: Patient: personal residence Provider: [facility] office   I discussed the limitations of evaluation and management by telemedicine and the availability of in person appointments. The patient expressed understanding and agreed to proceed. History of Present Illness: Michelle Newton is a 63 y/o female with PMH of asthma and LLL pulmonary nodule s/p biopsy x2 who presents via telehealth visit for discussion of biopsy results following bronchoscopy completed on 10/05/2024.  Initial results reviewed with patient today; final results still pending. Initial results indicate rare large atypical cells, but insufficient material for further characterization.   She reports an episode of hemoptysis following the procedure, but has otherwise had no breathing concerns.  She has also had some voice hoarseness following the procedure that has slowly been improving.  She was treated with antibiotics over the weekend for a UTI. She denies fever, chills, DOE, productive cough or other c/o.  Last OV 09/22/2024: Presents for follow up of the LLL pulmonary nodule that was previously biopsied with negative cytology (EBUS/TBNA at stations 7 and 11L also negative). She's had repeat chest imaging with radiology read reporting possible small interval growth. Given this, discussed with patient and recommended obtaining a PET/CT to establish FDG avidity, and consideration of repeat bronchoscopy to establish the diagnosis.   We discussed the importance of diagnosis and staging in lung malignancies, and the approach to obtaining a tissue diagnosis which would include robotic assisted  navigational bronchoscopy with endobronchial ultrasound guided sampling.  We also discussed the risks associated with the procedure which include a 2% risk of pneumothorax, infection, bleeding, and nondiagnostic procedure in detail.  I explained that patients typically are able to return home the same day of the procedure, but in rare cases admission to the hospital for observation and treatment is required.   After our discussion, the patient elected to proceed with the procedure   Recommendations:   - Procedural/ Surgical Case Request: VIDEO BRONCHOSCOPY WITH ENDOBRONCHIAL NAVIGATION; Future - NM PET Image Initial (PI) Skull Base To Thigh (F-18 FDG); Future  Tests/results:   PET scan 09/28/2024: IMPRESSION: 1. Subsolid 1.7 x 0.9 cm left lower lobe pulmonary nodule with mild uptake (SUVmax 1.4); low-grade adenocarcinoma cannot be excluded particularly given growth over the last year . 2. Focal increased uptake at the posterior right humeral head (SUVmax 6.4) favors arthropathy over malignancy. 3. Atherosclerotic calcifications involving the right common carotid, thoracic aorta, coronary arteries, and branch vessels. 4. Emphysema. Allergies  Allergen Reactions   Cymbalta [Duloxetine Hcl] Other (See Comments)    dizzy   Duloxetine     Other reaction(s): Dizziness   Flagyl [Metronidazole] Other (See Comments)    Other reaction(s): Other (See Comments) dizzy   Gabapentin Other (See Comments)    Other Reaction(s): dizziness   Pregabalin Other (See Comments)    Other reaction(s): Dizziness  Caused eye damage  It did damage to my eyes  Other Reaction(s): dizziness, Not available   Nsaids Other (See Comments) and Rash    bleeding   Silicone Rash   Tape Rash   Immunization History  Administered Date(s) Administered   Influenza,inj,Quad  PF,6+ Mos 07/09/2018, 07/14/2019, 07/30/2024   Influenza-Unspecified 08/24/2023   PFIZER(Purple Top)SARS-COV-2 Vaccination 03/09/2020,  04/05/2020, 10/09/2020   Tdap 05/07/2017, 06/29/2019   Past Medical History:  Diagnosis Date   Anxiety    Aortic atherosclerosis    Asthma    CAD (coronary artery disease)    Chronic fatigue    Chronic thumb pain, bilateral 09/12/2015   COPD (chronic obstructive pulmonary disease) (HCC)    Coronary artery disease    Depression    DJD (degenerative joint disease)    DJD (degenerative joint disease)    Dyspnea    Emphysema lung (HCC)    Essential hypertension 09/12/2015   Family history of chronic pain 09/26/2015   Fibromyalgia    Fibromyalgia    Fracture five ribs-closed 09/12/2015   H/O acute myocardial infarction 12/14/2014   H/O non-insulin dependent diabetes mellitus 09/12/2015   History of cardiac arrhythmia 09/12/2015   History of elbow surgery 09/26/2015   Left ulnar nerve transposition   History of knee surgery 09/26/2015   History of migraine 09/12/2015   History of seizures 09/12/2015   Hypercholesteremia    Lung nodule    MI (myocardial infarction) (HCC)    Migraine    Migraine    Osteoarthritis    PAD (peripheral artery disease)    Pneumonia    Right shoulder: Acute Pain due to trauma 04/18/2016   Right shoulder: Chronic Pain 09/12/2015   Seizures (HCC)    Ulcerative (chronic) enterocolitis (HCC)    Vertigo     Tobacco History: Social History   Tobacco Use  Smoking Status Every Day   Current packs/day: 0.25   Average packs/day: 0.3 packs/day for 48.9 years (12.2 ttl pk-yrs)   Types: Cigarettes   Start date: 1977  Smokeless Tobacco Never  Tobacco Comments   0.25 PPD- khj 04/16/2024      Started smoking at 63 years old   Smoked 1 PPD at her heaviest   Ready to quit: Not Answered Counseling given: Not Answered Tobacco comments: 0.25 PPD- khj 04/16/2024  Started smoking at 63 years old Smoked 1 PPD at her heaviest   Outpatient Medications Prior to Visit  Medication Sig Dispense Refill   albuterol  (PROVENTIL ) (2.5 MG/3ML) 0.083% nebulizer  solution as needed.     albuterol -ipratropium (COMBIVENT) 18-103 MCG/ACT inhaler Inhale 2 puffs into the lungs every 6 (six) hours as needed for wheezing or shortness of breath.     aspirin EC 81 MG tablet Take 81 mg by mouth daily.     azelastine (OPTIVAR) 0.05 % ophthalmic solution Place 1 drop into both eyes daily.     benzonatate (TESSALON) 200 MG capsule Take 200 mg by mouth 3 (three) times daily as needed for cough. (Patient not taking: Reported on 10/05/2024)     bisoprolol-hydrochlorothiazide (ZIAC) 5-6.25 MG tablet Take 1 tablet by mouth daily.     butalbital-acetaminophen -caffeine (FIORICET, ESGIC) 50-325-40 MG tablet Take 1 tablet by mouth 2 (two) times daily.     Calcium  Carbonate-Vit D-Min (CALCIUM  1200 PO) Take by mouth daily.     Cholecalciferol (VITAMIN D ) 2000 UNITS tablet Take 1 tablet (2,000 Units total) by mouth daily. (Patient taking differently: Take 4,000 Units by mouth daily.) 30 tablet PRN   cyanocobalamin (,VITAMIN B-12,) 1000 MCG/ML injection every 30 (thirty) days.     diazepam (VALIUM) 5 MG tablet Take 5 mg by mouth as needed for anxiety. daily     dicyclomine (BENTYL) 10 MG capsule take 1 capsule by mouth four times  a day if needed for ABDOMINAL SPASM  0   estradiol (CLIMARA - DOSED IN MG/24 HR) 0.1 mg/24hr patch Place 0.1 mg onto the skin once a week. On Wednesday (Patient taking differently: Place 0.5 mg onto the skin once a week. On Wednesday)     ezetimibe  (ZETIA ) 10 MG tablet Take 1 tablet (10 mg total) by mouth daily. 90 tablet 3   hydrochlorothiazide (HYDRODIURIL) 12.5 MG tablet  (Patient taking differently: PRN)     HYDROcodone-acetaminophen  (NORCO) 10-325 MG tablet Take 1 tablet by mouth 3 (three) times daily as needed.     MAGNESIUM-POTASSIUM PO Take 1 tablet by mouth daily in the afternoon.     meclizine (ANTIVERT) 25 MG tablet Take 25 mg by mouth 3 (three) times daily as needed.     nitroGLYCERIN (NITROSTAT) 0.4 MG SL tablet Place 0.4 mg under the tongue  every 5 (five) minutes as needed for chest pain.     PARoxetine (PAXIL) 30 MG tablet Take 30 mg by mouth daily. 1.5 tablet daily     promethazine (PHENERGAN) 25 MG tablet Take 25 mg by mouth every 6 (six) hours as needed for nausea or vomiting.     rosuvastatin  (CRESTOR ) 20 MG tablet Take 20 mg by mouth at bedtime.     vitamin E 1000 UNIT capsule Take 1,000 Units by mouth daily.     No facility-administered medications prior to visit.     Review of Systems:   Constitutional: No weight loss or gain, night sweats, fevers, chills, fatigue, or lassitude. HEENT: No headaches, difficulty swallowing, tooth/dental problems, or sore throat. No sneezing, itching, ear ache, nasal congestion, or post nasal drip CV:  No chest pain, orthopnea, PND, swelling in lower extremities, anasarca, dizziness, palpitations, syncope Resp: No shortness of breath with exertion or at rest. No excess mucus or change in color of mucus. No productive or non-productive. No hemoptysis. No wheezing.  No chest wall deformity GI:  No heartburn, indigestion, abdominal pain, nausea, vomiting, diarrhea, change in bowel habits, loss of appetite, bloody stools.  GU: No dysuria, change in color of urine, urgency or frequency.  No flank pain, no hematuria  Skin: No rash, lesions, ulcerations MSK:  No joint pain or swelling.  No decreased range of motion.  No back pain. Neuro: No dizziness or lightheadedness.  Psych: No depression or anxiety. Mood stable.   Observations/Objective:   Assessment and Plan:  Lung nodule: - s/p biopsy x2; preliminary results of biopsy demonstrate some large atypical cells, but not enough for diagnosis.  This was deemed a final result by [facility] pathology. - Case d/w Dr. Isadora.  Plan for review of patient's case at Thoracic Tumor Board this week to help guide care.  Full plan TBD.  Patient aware.  Follow Up Instructions:    I discussed the assessment and treatment plan with the patient. The  patient was provided an opportunity to ask questions and all were answered. The patient agreed with the plan and demonstrated an understanding of the instructions.   The patient was advised to call back or seek an in-person evaluation if the symptoms worsen or if the condition fails to improve as anticipated.     Candis Dandy, PA-C  Candis Dandy, PA-C 10/11/2024

## 2024-10-14 ENCOUNTER — Telehealth: Payer: Self-pay

## 2024-10-14 ENCOUNTER — Other Ambulatory Visit: Payer: Self-pay

## 2024-10-14 NOTE — Progress Notes (Signed)
 The proposed treatment discussed in conference is for discussion purpose only and is not a binding recommendation.  The patients have not been physically examined, or presented with their treatment options.  Therefore, final treatment plans cannot be decided.

## 2024-10-14 NOTE — Telephone Encounter (Signed)
 Thoracic Tumor Board Review  Michelle Newton was reviewed before presentation on the on the Thoracic Tumor Board on 10/14/2024. She has a history of atypical cells in left lower lung lobe.  Michelle Newton will likely meet National Comprehensive Cancer Network (NCCN) criteria for genetic testing based on her strong family history of cancer which includes colon cancer in her brother and paternal aunt and breast cancer in her maternal aunt and maternal 1st cousin.   Santana Fryer, MS, CGC  Certified Genetic Counselor  Email: [email or web address]  Phone: [phone]

## 2024-10-18 ENCOUNTER — Ambulatory Visit: Payer: Self-pay | Admitting: Student in an Organized Health Care Education/Training Program

## 2024-10-18 DIAGNOSIS — R911 Solitary pulmonary nodule: Secondary | ICD-10-CM

## 2024-11-01 ENCOUNTER — Ambulatory Visit
Admission: RE | Admit: 2024-11-01 | Discharge: 2024-11-01 | Disposition: A | Source: Ambulatory Visit | Attending: Radiation Oncology | Admitting: Radiation Oncology

## 2024-11-01 ENCOUNTER — Encounter: Payer: Self-pay | Admitting: Radiation Oncology

## 2024-11-01 VITALS — BP 103/79 | HR 95 | Temp 96.1°F | Resp 16 | Ht 59.5 in | Wt 132.9 lb

## 2024-11-01 DIAGNOSIS — I739 Peripheral vascular disease, unspecified: Secondary | ICD-10-CM | POA: Diagnosis not present

## 2024-11-01 DIAGNOSIS — I700 Atherosclerosis of aorta: Secondary | ICD-10-CM | POA: Insufficient documentation

## 2024-11-01 DIAGNOSIS — I2510 Atherosclerotic heart disease of native coronary artery without angina pectoris: Secondary | ICD-10-CM | POA: Diagnosis not present

## 2024-11-01 DIAGNOSIS — M797 Fibromyalgia: Secondary | ICD-10-CM | POA: Diagnosis not present

## 2024-11-01 DIAGNOSIS — Z7982 Long term (current) use of aspirin: Secondary | ICD-10-CM | POA: Insufficient documentation

## 2024-11-01 DIAGNOSIS — E119 Type 2 diabetes mellitus without complications: Secondary | ICD-10-CM | POA: Insufficient documentation

## 2024-11-01 DIAGNOSIS — R918 Other nonspecific abnormal finding of lung field: Secondary | ICD-10-CM | POA: Insufficient documentation

## 2024-11-01 DIAGNOSIS — Z808 Family history of malignant neoplasm of other organs or systems: Secondary | ICD-10-CM | POA: Diagnosis not present

## 2024-11-01 DIAGNOSIS — Z8781 Personal history of (healed) traumatic fracture: Secondary | ICD-10-CM | POA: Insufficient documentation

## 2024-11-01 DIAGNOSIS — F17210 Nicotine dependence, cigarettes, uncomplicated: Secondary | ICD-10-CM | POA: Insufficient documentation

## 2024-11-01 DIAGNOSIS — Z79899 Other long term (current) drug therapy: Secondary | ICD-10-CM | POA: Diagnosis not present

## 2024-11-01 DIAGNOSIS — Z7952 Long term (current) use of systemic steroids: Secondary | ICD-10-CM | POA: Insufficient documentation

## 2024-11-01 DIAGNOSIS — R5383 Other fatigue: Secondary | ICD-10-CM | POA: Insufficient documentation

## 2024-11-01 DIAGNOSIS — Z803 Family history of malignant neoplasm of breast: Secondary | ICD-10-CM | POA: Diagnosis not present

## 2024-11-01 DIAGNOSIS — I252 Old myocardial infarction: Secondary | ICD-10-CM | POA: Diagnosis not present

## 2024-11-01 DIAGNOSIS — J432 Centrilobular emphysema: Secondary | ICD-10-CM | POA: Diagnosis not present

## 2024-11-01 NOTE — Consult Note (Signed)
 NEW PATIENT EVALUATION  Name: Michelle Newton  MRN: [id number]  Date:   11/01/2024     DOB: 04-02-1961   This 63 y.o. 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   PREVIOUS INVESTIGATIONS:  Serial CT scans and PET scan reviewed compatible with above-stated findings. Clinical notes reviewed Cytology and surgical pathology notes reviewed  HPI: Patient is a 63 year old female with COPD and centrilobular emphysema who has noticed to have a slightly increasing irregular subsolid nodule in infrahilar left lower lobe.  Over time had grown from 1.7 to 0.8 cm to 2.0 x 1.1 cm.  There is no evidence of lymphadenopathy or metastatic disease.  Patient had a PET CT scan which showed mild uptake on PET scan of SUV 1.4 although this is extremely small lesion making that even a significant uptake of FDG.  Patient does have a chronic cough no hemoptysis chest tightness or dysphagia.  She did have navigational bronchoscopy which did show rare large atypical cells concerning for malignancy.  Patient has an appointment with thoracic surgeon is now referred to radiation oncology for consideration of treatment.  PLANNED TREATMENT REGIMEN: SBRT  PAST MEDICAL HISTORY:  has a past medical history of Anxiety, Aortic atherosclerosis, Asthma, CAD (coronary artery disease), Chronic fatigue, Chronic thumb pain, bilateral (09/12/2015), COPD (chronic obstructive pulmonary disease) (HCC), Coronary artery disease, Depression, DJD (degenerative joint disease), DJD (degenerative joint disease), Dyspnea, Emphysema lung (HCC), Essential hypertension (09/12/2015), Family history of chronic pain (09/26/2015), Fibromyalgia,  Fibromyalgia, Fracture five ribs-closed (09/12/2015), H/O acute myocardial infarction (12/14/2014), H/O non-insulin dependent diabetes mellitus (09/12/2015), History of cardiac arrhythmia (09/12/2015), History of elbow surgery (09/26/2015), History of knee surgery (09/26/2015), History of migraine (09/12/2015), History of seizures (09/12/2015), Hypercholesteremia, Lung nodule, MI (myocardial infarction) (HCC), Migraine, Migraine, Osteoarthritis, PAD (peripheral artery disease), Pneumonia, Right shoulder: Acute Pain due to trauma (04/18/2016), Right shoulder: Chronic Pain (09/12/2015), Seizures (HCC), Ulcerative (chronic) enterocolitis (HCC), and Vertigo.    PAST SURGICAL HISTORY:  Past Surgical History:  Procedure Laterality Date   ABDOMINAL AORTA STENT  2021   ABDOMINAL HYSTERECTOMY     BACK SURGERY     BRONCHIAL BIOPSY  12/18/2023   Procedure: BRONCHIAL BIOPSIES;  Surgeon: Isadora Hose, MD;  Location: [facility] ENDOSCOPY;  Service: Pulmonary;;   BRONCHIAL BRUSHINGS  12/18/2023   Procedure: BRONCHIAL BRUSHINGS;  Surgeon: Isadora Hose, MD;  Location: [facility] ENDOSCOPY;  Service: Pulmonary;;   BRONCHIAL NEEDLE ASPIRATION BIOPSY  12/18/2023   Procedure: BRONCHIAL NEEDLE ASPIRATION BIOPSIES;  Surgeon: Isadora Hose, MD;  Location: [facility] ENDOSCOPY;  Service: Pulmonary;;   BRONCHIAL WASHINGS  12/18/2023   Procedure: BRONCHIAL WASHINGS;  Surgeon: Isadora Hose, MD;  Location: [facility] ENDOSCOPY;  Service: Pulmonary;;   ENDOBRONCHIAL ULTRASOUND Bilateral 10/05/2024   Procedure: ENDOBRONCHIAL ULTRASOUND (EBUS);  Surgeon: Isadora Hose, MD;  Location: [facility] ORS;  Service: Pulmonary;  Laterality: Bilateral;   FRACTURE SURGERY Left    x 2 surgeries with pins and screws   left elbow surgery     Left Jaw fractured and repaired     pin placed - lower left   left knee arthoscopic procedure     LUMBAR DISC SURGERY     Right Thumb - pin placed  ROTATOR CUFF REPAIR Right    TRIGGER FINGER RELEASE Left    ureter mesh      VIDEO BRONCHOSCOPY WITH ENDOBRONCHIAL NAVIGATION Bilateral 10/05/2024   Procedure: VIDEO BRONCHOSCOPY WITH ENDOBRONCHIAL NAVIGATION;  Surgeon: Isadora Hose, MD;  Location: [facility] ORS;  Service: Pulmonary;  Laterality: Bilateral;   VIDEO BRONCHOSCOPY WITH ENDOBRONCHIAL ULTRASOUND  12/18/2023   Procedure: VIDEO BRONCHOSCOPY WITH ENDOBRONCHIAL ULTRASOUND;  Surgeon: Isadora Hose, MD;  Location: [facility] ENDOSCOPY;  Service: Pulmonary;;    FAMILY HISTORY: family history includes Arthritis in her brother, father, mother, and son; Brain cancer in her mother; Breast cancer in her cousin and maternal aunt; Colon cancer in her brother and paternal aunt; Heart disease in her brother, father, maternal aunt, maternal grandfather, paternal aunt, paternal grandfather, and paternal uncle; Hypertension in her brother, father, mother, paternal aunt, paternal uncle, and sister; Lung cancer in her brother and mother; Osteoporosis in her father.  SOCIAL HISTORY:  reports that she has been smoking cigarettes. She started smoking about 48 years ago. She has a 12.2 pack-year smoking history. She has never used smokeless tobacco. She reports that she does not drink alcohol and does not use drugs.  ALLERGIES: Cymbalta [duloxetine hcl], Duloxetine, Flagyl [metronidazole], Gabapentin, Pregabalin, Nsaids, Silicone, and Tape  MEDICATIONS:  Current Outpatient Medications  Medication Sig Dispense Refill   albuterol  (PROVENTIL ) (2.5 MG/3ML) 0.083% nebulizer solution as needed.     albuterol -ipratropium (COMBIVENT) 18-103 MCG/ACT inhaler Inhale 2 puffs into the lungs every 6 (six) hours as needed for wheezing or shortness of breath.     aspirin EC 81 MG tablet Take 81 mg by mouth daily.     azelastine (OPTIVAR) 0.05 % ophthalmic solution Place 1 drop into both eyes daily.     benzonatate (TESSALON) 200 MG capsule Take 200 mg by mouth 3 (three) times daily as needed for cough. (Patient not taking: Reported on 10/05/2024)      bisoprolol-hydrochlorothiazide (ZIAC) 5-6.25 MG tablet Take 1 tablet by mouth daily.     butalbital-acetaminophen -caffeine (FIORICET, ESGIC) 50-325-40 MG tablet Take 1 tablet by mouth 2 (two) times daily.     Calcium  Carbonate-Vit D-Min (CALCIUM  1200 PO) Take by mouth daily.     Cholecalciferol (VITAMIN D ) 2000 UNITS tablet Take 1 tablet (2,000 Units total) by mouth daily. (Patient taking differently: Take 4,000 Units by mouth daily.) 30 tablet PRN   cyanocobalamin (,VITAMIN B-12,) 1000 MCG/ML injection every 30 (thirty) days.     diazepam (VALIUM) 5 MG tablet Take 5 mg by mouth as needed for anxiety. daily     dicyclomine (BENTYL) 10 MG capsule take 1 capsule by mouth four times a day if needed for ABDOMINAL SPASM  0   estradiol (CLIMARA - DOSED IN MG/24 HR) 0.1 mg/24hr patch Place 0.1 mg onto the skin once a week. On Wednesday (Patient taking differently: Place 0.5 mg onto the skin once a week. On Wednesday)     ezetimibe  (ZETIA ) 10 MG tablet Take 1 tablet (10 mg total) by mouth daily. 90 tablet 3   hydrochlorothiazide (HYDRODIURIL) 12.5 MG tablet  (Patient taking differently: PRN)     HYDROcodone-acetaminophen  (NORCO) 10-325 MG tablet Take 1 tablet by mouth 3 (three) times daily as needed.     MAGNESIUM-POTASSIUM PO Take 1 tablet by mouth daily in the afternoon.     meclizine (ANTIVERT) 25 MG tablet Take 25 mg by mouth 3 (three) times daily as needed.     nitroGLYCERIN (NITROSTAT) 0.4 MG SL tablet Place 0.4 mg under the  tongue every 5 (five) minutes as needed for chest pain.     PARoxetine (PAXIL) 30 MG tablet Take 30 mg by mouth daily. 1.5 tablet daily     promethazine (PHENERGAN) 25 MG tablet Take 25 mg by mouth every 6 (six) hours as needed for nausea or vomiting.     rosuvastatin  (CRESTOR ) 20 MG tablet Take 20 mg by mouth at bedtime.     vitamin E 1000 UNIT capsule Take 1,000 Units by mouth daily.     No current facility-administered medications for this encounter.    ECOG PERFORMANCE  STATUS:  1 - Symptomatic but completely ambulatory  REVIEW OF SYSTEMS: Patient denies any weight loss, fatigue, weakness, fever, chills or night sweats. Patient denies any loss of vision, blurred vision. Patient denies any ringing  of the ears or hearing loss. No irregular heartbeat. Patient denies heart murmur or history of fainting. Patient denies any chest pain or pain radiating to her upper extremities. Patient denies any shortness of breath, difficulty breathing at night, cough or hemoptysis. Patient denies any swelling in the lower legs. Patient denies any nausea vomiting, vomiting of blood, or coffee ground material in the vomitus. Patient denies any stomach pain. Patient states has had normal bowel movements no significant constipation or diarrhea. Patient denies any dysuria, hematuria or significant nocturia. Patient denies any problems walking, swelling in the joints or loss of balance. Patient denies any skin changes, loss of hair or loss of weight. Patient denies any excessive worrying or anxiety or significant depression. Patient denies any problems with insomnia. Patient denies excessive thirst, polyuria, polydipsia. Patient denies any swollen glands, patient denies easy bruising or easy bleeding. Patient denies any recent infections, allergies or URI. Patient s visual fields have not changed significantly in recent time.   PHYSICAL EXAM: BP 103/79   Pulse 95   Temp (!) 96.1 F (35.6 C) (Tympanic)   Resp 16   Ht 4' 11.5 (1.511 m)   Wt 132 lb 14.4 oz (60.3 kg)   BMI 26.39 kg/m  Well-developed well-nourished patient in NAD. HEENT reveals PERLA, EOMI, discs not visualized.  Oral cavity is clear. No oral mucosal lesions are identified. Neck is clear without evidence of cervical or supraclavicular adenopathy. Lungs are clear to A&P. Cardiac examination is essentially unremarkable with regular rate and rhythm without murmur rub or thrill. Abdomen is benign with no organomegaly or masses  noted. Motor sensory and DTR levels are equal and symmetric in the upper and lower extremities. Cranial nerves II through XII are grossly intact. Proprioception is intact. No peripheral adenopathy or edema is identified. No motor or sensory levels are noted. Crude visual fields are within normal range.  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  Would plan on delivering 55 Gray in 5 fractions to her left lower lobe nodule.  Risks and benefits of treatment including extremely low side effect profile for SBRT were reviewed with the patient.  I have encouraged patient to keep her thoracic surgery consultation and have discussions with them on their treatment recommendations.  Patient and husband both comprehend my treatment plan well.  I would like to take this opportunity to thank you for  allowing me to participate in the care of your patient.SABRA Marcey Penton, MD

## 2024-11-09 ENCOUNTER — Ambulatory Visit: Admitting: Thoracic Surgery (Cardiothoracic Vascular Surgery)

## 2024-11-10 ENCOUNTER — Ambulatory Visit
Admission: RE | Admit: 2024-11-10 | Discharge: 2024-11-10 | Attending: Radiation Oncology | Admitting: Radiation Oncology

## 2024-11-10 DIAGNOSIS — C3432 Malignant neoplasm of lower lobe, left bronchus or lung: Secondary | ICD-10-CM | POA: Diagnosis present

## 2024-11-10 DIAGNOSIS — Z510 Encounter for antineoplastic radiation therapy: Secondary | ICD-10-CM | POA: Diagnosis present

## 2024-11-19 ENCOUNTER — Ambulatory Visit: Attending: Cardiology | Admitting: Cardiology

## 2024-11-19 DIAGNOSIS — Z510 Encounter for antineoplastic radiation therapy: Secondary | ICD-10-CM | POA: Diagnosis not present

## 2024-11-19 NOTE — Progress Notes (Deleted)
 " Cardiology Office Note   Date:  11/19/2024  ID:  Michelle Newton, DOB October 30, 1961, MRN [id number] PCP: Derick Leita POUR, MD  [facility] Providers Cardiologist:  Redell Cave, MD { Click to update primary MD,subspecialty MD or APP then REFRESH:1}    History of Present Illness Michelle Newton is a 63 y.o. female with past medical history of hypertension, hyperlipidemia, tobacco use, COPD, PAD status post left iliac stent (2021), who presents today for follow-up.   Previously was seen in October 2024 for elevated blood pressure and chest pain.  Blood pressure was different and extremities.  Arterial ultrasound (extremities, echocardiogram, Lexiscan  Myoview  ordered.  Echocardiogram revealed LVEF of 60 to 65%, no RWMA, G1 DD.  MPI showed no ischemia 8 mm lung nodule. Upper extremity ultrasound revealed stenosis of the left subclavian artery.   She previously been seen in clinic 10/22/2023 stating she was having occasional cramping that was brief but denied any chest pain and continued chronic shortness of breath likely from COPD.  Rosuvastatin  was increased to 40 mg daily referral was made to vascular surgery.  She was seen in clinic 4//2025.  She had had left arm pain that was worse when she was lying down and extends down her arm into her hand and she was worried that her subclavian stenosis had worsened.  Previously she was advised to increase her rosuvastatin  to 40 mg daily and that she was only taking 20 mg due to diarrhea.  She states her back on 20 mg and is tolerating the medication without issues.  Previously seen in clinic 04/23/2024 also complained of left arm pain back and abdomen appointment with vascular surgery for left subclavian stenosis.  Ezetimibe  10 mg daily was recently added to her medication regimen for cholesterol management.  She was last seen in clinic 08/20/2024 with continued left arm discomfort she had followed up with vascular and was advised that she still  continues to have good blood flow and they had that there was no intervention that needs to be done at this time.  Recommended repeat hepatic panel in 3 months as she had elevated AST and L ALT continue pain medications and if she continues with chronic elevations would likely have to discontinue ezetimibe .   She returns to clinic today stating she had previously undergone a bronchoscopy and biopsy with Dr. Burnard from pulmonary and was found to have lung cancer.  She has recently followed up with radiation oncology on 11/01/2024.  She was planned for SBRT.  ROS: 10 point review of systems has been reviewed and considered negative the exception was been listed in the HPI  Studies Reviewed      Myoview  lexiscan  10/24/23 Narrative & Impression      Normal pharmacologic myocardial perfusion stress test without evidence of significant ischemia or scar.   Left ventricular systolic function is normal (LVEF > 65%).   There is no significant coronary artery calcification.  Aortic atherosclerosis is present.   An 8 mm nodule is identified in the left lower lobe.  Further evaluation with dedicated chest CT is recommended.   This is a low-risk study.      Echo 10/2023    1. Left ventricular ejection fraction, by estimation, is 60 to 65%. The  left ventricle has normal function. The left ventricle has no regional  wall motion abnormalities. Left ventricular diastolic parameters are  consistent with Grade I diastolic  dysfunction (impaired relaxation). The average left ventricular global  longitudinal strain is -  16.6 %.   2. Right ventricular systolic function is normal. The right ventricular  size is normal. There is normal pulmonary artery systolic pressure. The  estimated right ventricular systolic pressure is 30.4 mmHg.   3. The mitral valve is normal in structure. No evidence of mitral valve  regurgitation. No evidence of mitral stenosis.   4. The aortic valve is tricuspid. Aortic valve  regurgitation is not  visualized. Aortic valve sclerosis is present, with no evidence of aortic  valve stenosis.   5. The inferior vena cava is normal in size with greater than 50%  respiratory variability, suggesting right atrial pressure of 3 mmHg.    Risk Assessment/Calculations   No BP recorded.  {Refresh Note OR Click here to enter BP  :1}***       Physical Exam VS:  There were no vitals taken for this visit.       Wt Readings from Last 3 Encounters:  11/01/24 132 lb 14.4 oz (60.3 kg)  09/22/24 122 lb 3.2 oz (55.4 kg)  08/20/24 128 lb 6.4 oz (58.2 kg)    GEN: Well nourished, well developed in no acute distress NECK: No JVD; No carotid bruits CARDIAC: ***RRR, no murmurs, rubs, gallops RESPIRATORY:  Clear to auscultation without rales, wheezing or rhonchi  ABDOMEN: Soft, non-tender, non-distended EXTREMITIES:  No edema; No deformity   ASSESSMENT AND PLAN Peripheral arterial disease noted left subclavian stenosis Mixed hyperlipidemia Lung cancer Pulmonary hypertension    {Are you ordering a CV Procedure (e.g. stress test, cath, DCCV, TEE, etc)?   Press F2        :[id number]}  Dispo: ***  Signed, Tyress Loden, NP   "

## 2024-11-24 ENCOUNTER — Ambulatory Visit: Admitting: Cardiology

## 2024-11-24 NOTE — Progress Notes (Deleted)
" °  Cardiology Office Note   Date: 11/24/2024  ID:  Michelle Newton 1961/11/03 [id number] PCP: Michelle Leita POUR, MD  [facility] Providers Cardiologist: Michelle Cave, MD { Click to update primary MD,subspecialty MD or APP then REFRESH:1}    Chief Complaint: Michelle Newton is a 63 y.o.female with PMH of hypertension, hyperlipidemia, COPD, PAD s/p left iliac stent 2021, tobacco use who presents to the clinic for follow-up.     Michelle Newton was seen in clinic 08/20/2024 with continued left arm pain. Had seen [facility] at [facility] in [place] for left subclavian stenosis. Reportedly no plans for intervention at that time. She was continued on aspirin, rosuvastatin , and Zetia . Noted prior LFTs elevated and these were recommended to be repeated, with plans of possibly stopping Zetia . It does not appear that these were done.      History of Present Illness: Today ***  ?meds, left arm pain/VVS, RUQ pain, CP/SOB/edema, BP at home/extra hctz, smoking, claudication  PAD: Follows with [facility] at [facility] in [place]. No plans for intervention at this time. *** - Continue aspirin 81 mg daily - Continue rosuvastatin  20 mg daily and ezetimibe  10 mg daily ???LFTs  Hypertension: BP today ***  - Continue bisoprolol-hydrochlorothiazide 5-6.25 mg daily  Hyperlipidemia: 08/13/2024 LDL 73, HDL 39, TGs 218, total 156, AST 50, ALT 68. - Medications as above - recommend stopping new Zetia  if still elevated/trending up  F/U 3 months  ROS: Please see the history of present illness. All other systems reviewed and are negative.   Studies Reviewed: The following studies were personally reviewed today ***:      EKG Interpretation Date/Time:    Ventricular Rate:    PR Interval:    QRS Duration:    QT Interval:    QTC Calculation:   R Axis:      Text Interpretation:      Risk Assessment/Calculations:  {Does this patient have ATRIAL FIBRILLATION?:[phone]}  No BP recorded.  {Refresh Note OR Click here to  enter BP  :1}***           Physical Exam: VS: There were no vitals taken for this visit. Wt Readings from Last 3 Encounters:  11/01/24 132 lb 14.4 oz (60.3 kg)  09/22/24 122 lb 3.2 oz (55.4 kg)  08/20/24 128 lb 6.4 oz (58.2 kg)     GEN: *** Well nourished, in NAD HEENT: Normal NECK: No JVD, no carotid bruits LYMPHATICS: No lymphadenopathy CARDIAC: ***RRR, no murmurs, rubs, gallops RESPIRATORY: Clear to auscultation without rales, wheezing or rhonchi  ABDOMEN: Soft, non-tender, non-distended MUSCULOSKELETAL: No edema, no deformity  SKIN: Warm and dry NEUROLOGIC:  Alert and oriented x 3 PSYCHIATRIC:  Normal affect   Assessment & Plan: ***    {Are you ordering a CV Procedure (e.g. stress test, cath, DCCV, TEE, etc)?   Press F2        :[id number]}   Dispo: ***  Signed, Michelle GORMAN Cleaves, NP 11/24/2024 9:02 AM [facility] "

## 2024-11-29 ENCOUNTER — Ambulatory Visit
Admission: RE | Admit: 2024-11-29 | Discharge: 2024-11-29 | Disposition: A | Discharge status: Home / Self Care | Source: Ambulatory Visit | Attending: Radiation Oncology | Admitting: Radiation Oncology

## 2024-11-29 ENCOUNTER — Other Ambulatory Visit: Payer: Self-pay

## 2024-11-29 DIAGNOSIS — Z510 Encounter for antineoplastic radiation therapy: Secondary | ICD-10-CM | POA: Diagnosis not present

## 2024-11-29 LAB — RAD ONC ARIA SESSION SUMMARY
Course Elapsed Days: 0
Plan Fractions Treated to Date: 1
Plan Prescribed Dose Per Fraction: 11 Gy
Plan Total Fractions Prescribed: 5
Plan Total Prescribed Dose: 55 Gy
Reference Point Dosage Given to Date: 11 Gy
Reference Point Session Dosage Given: 11 Gy
Session Number: 1

## 2024-11-30 ENCOUNTER — Ambulatory Visit
Attending: Thoracic Surgery (Cardiothoracic Vascular Surgery) | Admitting: Thoracic Surgery (Cardiothoracic Vascular Surgery)

## 2024-12-01 ENCOUNTER — Ambulatory Visit
Admission: RE | Admit: 2024-12-01 | Discharge: 2024-12-01 | Disposition: A | Discharge status: Home / Self Care | Source: Ambulatory Visit | Attending: Radiation Oncology | Admitting: Radiation Oncology

## 2024-12-01 ENCOUNTER — Other Ambulatory Visit: Payer: Self-pay

## 2024-12-01 DIAGNOSIS — Z510 Encounter for antineoplastic radiation therapy: Secondary | ICD-10-CM | POA: Diagnosis not present

## 2024-12-01 LAB — RAD ONC ARIA SESSION SUMMARY
Course Elapsed Days: 2
Plan Fractions Treated to Date: 2
Plan Prescribed Dose Per Fraction: 11 Gy
Plan Total Fractions Prescribed: 5
Plan Total Prescribed Dose: 55 Gy
Reference Point Dosage Given to Date: 22 Gy
Reference Point Session Dosage Given: 11 Gy
Session Number: 2

## 2024-12-07 ENCOUNTER — Ambulatory Visit
Admission: RE | Admit: 2024-12-07 | Discharge: 2024-12-07 | Disposition: A | Discharge status: Home / Self Care | Source: Ambulatory Visit | Attending: Radiation Oncology | Admitting: Radiation Oncology

## 2024-12-07 ENCOUNTER — Other Ambulatory Visit: Payer: Self-pay

## 2024-12-07 DIAGNOSIS — Z510 Encounter for antineoplastic radiation therapy: Secondary | ICD-10-CM | POA: Insufficient documentation

## 2024-12-07 DIAGNOSIS — C3432 Malignant neoplasm of lower lobe, left bronchus or lung: Secondary | ICD-10-CM | POA: Diagnosis present

## 2024-12-07 LAB — RAD ONC ARIA SESSION SUMMARY
Course Elapsed Days: 8
Plan Fractions Treated to Date: 3
Plan Prescribed Dose Per Fraction: 11 Gy
Plan Total Fractions Prescribed: 5
Plan Total Prescribed Dose: 55 Gy
Reference Point Dosage Given to Date: 33 Gy
Reference Point Session Dosage Given: 11 Gy
Session Number: 3

## 2024-12-09 ENCOUNTER — Ambulatory Visit
Admission: RE | Admit: 2024-12-09 | Discharge: 2024-12-09 | Disposition: A | Discharge status: Home / Self Care | Source: Ambulatory Visit | Attending: Radiation Oncology | Admitting: Radiation Oncology

## 2024-12-09 ENCOUNTER — Other Ambulatory Visit: Payer: Self-pay

## 2024-12-09 DIAGNOSIS — Z510 Encounter for antineoplastic radiation therapy: Secondary | ICD-10-CM | POA: Diagnosis not present

## 2024-12-09 LAB — RAD ONC ARIA SESSION SUMMARY
Course Elapsed Days: 10
Plan Fractions Treated to Date: 4
Plan Prescribed Dose Per Fraction: 11 Gy
Plan Total Fractions Prescribed: 5
Plan Total Prescribed Dose: 55 Gy
Reference Point Dosage Given to Date: 44 Gy
Reference Point Session Dosage Given: 11 Gy
Session Number: 4

## 2024-12-13 ENCOUNTER — Other Ambulatory Visit: Payer: Self-pay

## 2024-12-13 ENCOUNTER — Ambulatory Visit
Admission: RE | Admit: 2024-12-13 | Discharge: 2024-12-13 | Disposition: A | Source: Ambulatory Visit | Attending: Radiation Oncology | Admitting: Radiation Oncology

## 2024-12-13 DIAGNOSIS — Z510 Encounter for antineoplastic radiation therapy: Secondary | ICD-10-CM | POA: Diagnosis not present

## 2024-12-13 LAB — RAD ONC ARIA SESSION SUMMARY
Course Elapsed Days: 14
Plan Fractions Treated to Date: 5
Plan Prescribed Dose Per Fraction: 11 Gy
Plan Total Fractions Prescribed: 5
Plan Total Prescribed Dose: 55 Gy
Reference Point Dosage Given to Date: 55 Gy
Reference Point Session Dosage Given: 11 Gy
Session Number: 5

## 2024-12-14 ENCOUNTER — Encounter: Payer: Self-pay | Admitting: Cardiology

## 2024-12-14 ENCOUNTER — Ambulatory Visit: Attending: Cardiology | Admitting: Cardiology

## 2024-12-14 VITALS — BP 110/58 | HR 66 | Ht 59.5 in | Wt 130.2 lb

## 2024-12-14 DIAGNOSIS — E782 Mixed hyperlipidemia: Secondary | ICD-10-CM

## 2024-12-14 DIAGNOSIS — I771 Stricture of artery: Secondary | ICD-10-CM | POA: Diagnosis not present

## 2024-12-14 DIAGNOSIS — I739 Peripheral vascular disease, unspecified: Secondary | ICD-10-CM

## 2024-12-14 DIAGNOSIS — R911 Solitary pulmonary nodule: Secondary | ICD-10-CM

## 2024-12-14 DIAGNOSIS — Z79899 Other long term (current) drug therapy: Secondary | ICD-10-CM

## 2024-12-14 DIAGNOSIS — I10 Essential (primary) hypertension: Secondary | ICD-10-CM

## 2024-12-14 NOTE — Progress Notes (Signed)
 Per patient BP was done in Left arm

## 2024-12-14 NOTE — Patient Instructions (Signed)
 Medication Instructions:  Your physician recommends that you continue on your current medications as directed. Please refer to the Current Medication list given to you today.   *If you need a refill on your cardiac medications before your next appointment, please call your pharmacy*  Lab Work: Your provider would like for you to have following labs drawn today LDL direct and Hepatic .   If you have labs (blood work) drawn today and your tests are completely normal, you will receive your results only by: MyChart Message (if you have MyChart) OR A paper copy in the mail If you have any lab test that is abnormal or we need to change your treatment, we will call you to review the results.  Testing/Procedures: Your physician has requested that you have a carotid duplex in MAY. This test is an ultrasound of the carotid arteries in your neck. It looks at blood flow through these arteries that supply the brain with blood.   Allow one hour for this exam.  There are no restrictions or special instructions.  This will take place at [address] [facility] [facility] [address], [place] [address]  Please note: We ask at that you not bring children with you during ultrasound (echo/ vascular) testing. Due to room size and safety concerns, children are not allowed in the ultrasound rooms during exams. Our front office staff cannot provide observation of children in our lobby area while testing is being conducted. An adult accompanying a patient to their appointment will only be allowed in the ultrasound room at the discretion of the ultrasound technician under special circumstances. We apologize for any inconvenience.   Follow-Up: At [facility], you and your health needs are our priority.  As part of our continuing mission to provide you with exceptional heart care, our providers are all part of one team.  This team includes your primary Cardiologist (physician) and Advanced Practice Providers  or APPs (Physician Assistants and Nurse Practitioners) who all work together to provide you with the care you need, when you need it.  Your next appointment:   6 month(s)  Provider:   You may see Redell Cave, MD or one of the following Advanced Practice Providers on your designated Care Team:   Tylene Lunch, NP

## 2024-12-14 NOTE — Progress Notes (Addendum)
 " Cardiology Office Note   Date:  12/14/2024  ID:  Michelle Newton, DOB 02-Sep-1961, MRN [id number] PCP: Derick Leita POUR, MD  [facility] Providers Cardiologist:  Redell Cave, MD Cardiology APP:  Gerard Frederick, NP     History of Present Illness Michelle Newton is a 64 y.o. female with past medical history of hypertension, hyperlipidemia, tobacco use, COPD, PAD status post left iliac stent (2021), who presents today for follow-up.   Previously was seen in October 2024 for elevated blood pressure and chest pain.  Blood pressure was different and extremities.  Arterial ultrasound (extremities, echocardiogram, Lexiscan  Myoview  ordered.  Echocardiogram revealed LVEF of 60 to 65%, no RWMA, G1 DD.  MPI showed no ischemia 8 mm lung nodule. Upper extremity ultrasound revealed stenosis of the left subclavian artery.   She previously been seen in clinic 10/22/2023 stating she was having occasional cramping that was brief but denied any chest pain and continued chronic shortness of breath likely from COPD.  Rosuvastatin  was increased to 40 mg daily referral was made to vascular surgery.  She was seen in clinic 4//2025.  She had had left arm pain that was worse when she was lying down and extends down her arm into her hand and she was worried that her subclavian stenosis had worsened.  Previously she was advised to increase her rosuvastatin  to 40 mg daily and that she was only taking 20 mg due to diarrhea.  She states her back on 20 mg and is tolerating the medication without issues.  Previously seen in clinic 04/23/2024 also complained of left arm pain back and abdomen appointment with vascular surgery for left subclavian stenosis.  Ezetimibe  10 mg daily was recently added to her medication regimen for cholesterol management.  She was last seen in clinic 08/20/2024 with continued left arm discomfort she had followed up with vascular and was advised that she still continues to have good blood flow  and they had that there was no intervention that needs to be done at this time.  Recommended repeat hepatic panel in 3 months as she had elevated AST and L ALT continue pain medications and if she continues with chronic elevations would likely have to discontinue ezetimibe .   She returns to clinic today stating she had previously undergone a bronchoscopy and biopsy with Dr. Burnard from pulmonary and was found to have lung cancer.  She has recently followed up with radiation oncology on 11/01/2024.  She was planned for SBRT.  She just stated she had finished her radiation therapy.  Continues to shortness of breath and a productive cough which she stated she was told would be the outcome.  Denies any chest discomfort or palpitations. states that she has been compliant with her current medication regimen.  No recent hospitalizations or visits to the emergency department.  ROS: 10 point review of systems has been reviewed and considered negative the exception was been listed in the HPI  Studies Reviewed EKG Interpretation Date/Time:  Tuesday December 14 2024 15:16:53 EST Ventricular Rate:  66 PR Interval:  186 QRS Duration:  68 QT Interval:  422 QTC Calculation: 442 R Axis:   42  Text Interpretation: Normal sinus rhythm Low voltage QRS When compared with ECG of 23-Apr-2024 14:16, No significant change was found Confirmed by Gerard Frederick ([id number]) on 12/14/2024 3:19:53 PM    Myoview  lexiscan  10/24/23 Narrative & Impression      Normal pharmacologic myocardial perfusion stress test without evidence of significant ischemia or scar.  Left ventricular systolic function is normal (LVEF > 65%).   There is no significant coronary artery calcification.  Aortic atherosclerosis is present.   An 8 mm nodule is identified in the left lower lobe.  Further evaluation with dedicated chest CT is recommended.   This is a low-risk study.      Echo 10/2023    1. Left ventricular ejection fraction, by estimation,  is 60 to 65%. The  left ventricle has normal function. The left ventricle has no regional  wall motion abnormalities. Left ventricular diastolic parameters are  consistent with Grade I diastolic  dysfunction (impaired relaxation). The average left ventricular global  longitudinal strain is -16.6 %.   2. Right ventricular systolic function is normal. The right ventricular  size is normal. There is normal pulmonary artery systolic pressure. The  estimated right ventricular systolic pressure is 30.4 mmHg.   3. The mitral valve is normal in structure. No evidence of mitral valve  regurgitation. No evidence of mitral stenosis.   4. The aortic valve is tricuspid. Aortic valve regurgitation is not  visualized. Aortic valve sclerosis is present, with no evidence of aortic  valve stenosis.   5. The inferior vena cava is normal in size with greater than 50%  respiratory variability, suggesting right atrial pressure of 3 mmHg.    Risk Assessment/Calculations           Physical Exam VS:  BP (!) 110/58 (BP Location: Left Arm, Patient Position: Sitting, Cuff Size: Normal)   Pulse 66   Ht 4' 11.5 (1.511 m)   Wt 130 lb 3.2 oz (59.1 kg)   SpO2 98%   BMI 25.86 kg/m        Wt Readings from Last 3 Encounters:  12/14/24 130 lb 3.2 oz (59.1 kg)  11/01/24 132 lb 14.4 oz (60.3 kg)  09/22/24 122 lb 3.2 oz (55.4 kg)    GEN: Well nourished, well developed in no acute distress NECK: No JVD; No carotid bruits CARDIAC: RRR, no murmurs, rubs, gallops RESPIRATORY:  Clear to auscultation without rales, wheezing or rhonchi  ABDOMEN: Soft, non-tender, non-distended EXTREMITIES:  No edema; No deformity   ASSESSMENT AND PLAN Peripheral arterial disease noted left subclavian stenosis.  She has not had as much discomfort in the left upper extremity.  She has continuously been advised by previous vascular surgery at [facility] that there was no intervention that needed to be completed.  She has been scheduled  for an updated duplex in May 2026.  She has been continued on atorvastatin 81 mg daily, rosuvastatin  20 mg daily, and ezetimibe  10 mg daily.  Mixed hyperlipidemia with LDL that was down to 73 from previous 196 in April.  She has been continued on rosuvastatin  and ezetimibe .  She has been sent for an updated hepatic panel and direct LDL today to reevaluate her hepatic function as her last AST and ALT were elevated.  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  She states that she has an upcoming PET or chest CT.  Ongoing management per oncology and pulmonary.  Primary hypertension with a blood pressure today of 110/58.  Blood pressures remain stable.  She has been continued on HCTZ 12.5 mg daily.  She has also been encouraged to continue to monitor her pressures 1 to 2 hours postmedication administration as well.  EKG today reveals sinus rhythm with a rate of 66 no acute ischemic changes.        Dispo: Patient to return to clinic to see MD/APP in 6 months or sooner if needed for further evaluation.  Signed, [facility], NP   "

## 2024-12-15 ENCOUNTER — Ambulatory Visit: Payer: Self-pay | Admitting: Cardiology

## 2024-12-15 DIAGNOSIS — Z79899 Other long term (current) drug therapy: Secondary | ICD-10-CM

## 2024-12-15 DIAGNOSIS — E782 Mixed hyperlipidemia: Secondary | ICD-10-CM

## 2024-12-15 LAB — HEPATIC FUNCTION PANEL
ALT: 76 IU/L — ABNORMAL HIGH (ref 0–32)
AST: 64 IU/L — ABNORMAL HIGH (ref 0–40)
Albumin: 4.2 g/dL (ref 3.9–4.9)
Alkaline Phosphatase: 149 IU/L — ABNORMAL HIGH (ref 49–135)
Bilirubin Total: <0.2 mg/dL (ref 0.0–1.2)
Bilirubin, Direct: <0.08 mg/dL (ref 0.00–0.40)
Total Protein: 6.5 g/dL (ref 6.0–8.5)

## 2024-12-15 LAB — LDL CHOLESTEROL, DIRECT: LDL Direct: 71 mg/dL (ref 0–99)

## 2024-12-15 NOTE — Radiation Completion Notes (Signed)
 Patient Name: Michelle Newton, Michelle Newton MRN: [id number] Date of Birth: 08-Mar-1961 Referring Physician: [name], M.D. Date of Service: 2024-12-15 Radiation Oncologist: Marcey Penton, M.D. [facility] [place]                             RADIATION ONCOLOGY END OF TREATMENT NOTE     Diagnosis: R91.8 Other nonspecific abnormal finding of lung field Intent: Curative     HPI: Patient is a 64 year old female with COPD and centrilobular emphysema who has noticed to have a slightly increasing irregular subsolid nodule in infrahilar left lower lobe.  Over time had grown from 1.7 to 0.8 cm to 2.0 x 1.1 cm.  There is no evidence of lymphadenopathy or metastatic disease.  Patient had a PET CT scan which showed mild uptake on PET scan of SUV 1.4 although this is extremely small lesion making that even a significant uptake of FDG.  Patient does have a chronic cough no hemoptysis chest tightness or dysphagia.  She did have navigational bronchoscopy which did show rare large atypical cells concerning for malignancy.  Patient has an appointment with thoracic surgeon is now referred to radiation oncology for consideration of treatment.      ==========DELIVERED PLANS==========  First Treatment Date: 2024-11-29 Last Treatment Date: 2024-12-13   Plan Name: Lung_L_SBRT Site: Lung, Left Technique: SBRT/SRT-IMRT Mode: Photon Dose Per Fraction: 11 Gy Prescribed Dose (Delivered / Prescribed): 55 Gy / 55 Gy Prescribed Fxs (Delivered / Prescribed): 5 / 5     ==========ON TREATMENT VISIT DATES========== 2024-11-29, 2024-12-01, 2024-12-07, 2024-12-07, 2024-12-09, 2024-12-13     ==========UPCOMING VISITS========== 01/12/2025 [facility] RAD ONCOLOGY FOLLOW UP 30 Chrystal, Glenn, MD        ==========APPENDIX - ON TREATMENT VISIT NOTES==========   See weekly On Treatment Notes in Epic for details in the Media tab (listed as Progress notes on the On Treatment Visit Dates listed above).

## 2024-12-15 NOTE — Progress Notes (Signed)
 Liver functions have increased since the last check.  Recommend holding ezetimibe  and repeating hepatic functions in 6 weeks.  At that time we will determine if restarting ezetimibe  is beneficial.

## 2025-01-12 ENCOUNTER — Ambulatory Visit: Admitting: Radiation Oncology

## 2025-04-13 ENCOUNTER — Ambulatory Visit

## 2025-05-26 ENCOUNTER — Ambulatory Visit: Admitting: Cardiology
